# Patient Record
Sex: Female | Born: 1995 | Race: White | Hispanic: No | Marital: Single | Smoking: Never smoker
Health system: Southern US, Community
[De-identification: ages and names within clinical notes are randomized; demographics above are authoritative.]

## PROBLEM LIST (undated history)

## (undated) DIAGNOSIS — F419 Anxiety disorder, unspecified: Secondary | ICD-10-CM

## (undated) DIAGNOSIS — I10 Essential (primary) hypertension: Secondary | ICD-10-CM

## (undated) DIAGNOSIS — I4949 Other premature depolarization: Secondary | ICD-10-CM

## (undated) DIAGNOSIS — G35D Multiple sclerosis, unspecified: Secondary | ICD-10-CM

## (undated) DIAGNOSIS — G43909 Migraine, unspecified, not intractable, without status migrainosus: Secondary | ICD-10-CM

## (undated) DIAGNOSIS — F32A Depression, unspecified: Secondary | ICD-10-CM

## (undated) DIAGNOSIS — G35 Multiple sclerosis: Secondary | ICD-10-CM

## (undated) DIAGNOSIS — F329 Major depressive disorder, single episode, unspecified: Secondary | ICD-10-CM

## (undated) HISTORY — DX: Essential (primary) hypertension: I10

## (undated) HISTORY — DX: Other premature depolarization: I49.49

## (undated) HISTORY — PX: NO PAST SURGERIES: SHX2092

## (undated) HISTORY — DX: Multiple sclerosis: G35

## (undated) HISTORY — DX: Migraine, unspecified, not intractable, without status migrainosus: G43.909

## (undated) HISTORY — DX: Multiple sclerosis, unspecified: G35.D

## (undated) HISTORY — DX: Major depressive disorder, single episode, unspecified: F32.9

## (undated) HISTORY — DX: Anxiety disorder, unspecified: F41.9

## (undated) HISTORY — DX: Depression, unspecified: F32.A

---

## 2016-07-05 ENCOUNTER — Emergency Department
Admission: EM | Admit: 2016-07-05 | Discharge: 2016-07-05 | Disposition: A | Discharge status: Home / Self Care | Payer: BC Managed Care – PPO | Attending: Emergency Medicine | Admitting: Emergency Medicine

## 2016-07-05 ENCOUNTER — Encounter: Payer: Self-pay | Admitting: Emergency Medicine

## 2016-07-05 ENCOUNTER — Emergency Department: Payer: BC Managed Care – PPO

## 2016-07-05 DIAGNOSIS — Y939 Activity, unspecified: Secondary | ICD-10-CM | POA: Insufficient documentation

## 2016-07-05 DIAGNOSIS — Y999 Unspecified external cause status: Secondary | ICD-10-CM | POA: Insufficient documentation

## 2016-07-05 DIAGNOSIS — Y92410 Unspecified street and highway as the place of occurrence of the external cause: Secondary | ICD-10-CM | POA: Insufficient documentation

## 2016-07-05 DIAGNOSIS — S199XXA Unspecified injury of neck, initial encounter: Secondary | ICD-10-CM | POA: Diagnosis present

## 2016-07-05 DIAGNOSIS — M7918 Myalgia, other site: Secondary | ICD-10-CM

## 2016-07-05 DIAGNOSIS — S161XXA Strain of muscle, fascia and tendon at neck level, initial encounter: Secondary | ICD-10-CM | POA: Insufficient documentation

## 2016-07-05 HISTORY — DX: Other premature depolarization: I49.49

## 2016-07-05 LAB — POCT PREGNANCY, URINE: PREG TEST UR: NEGATIVE

## 2016-07-05 MED ORDER — CYCLOBENZAPRINE HCL 5 MG PO TABS: 5.0000 mg | ORAL_TABLET | Freq: Three times a day (TID) | ORAL | 0 refills | Status: DC | PRN

## 2016-07-05 MED ORDER — KETOROLAC TROMETHAMINE 30 MG/ML IJ SOLN
INTRAMUSCULAR | Status: AC
  Filled 2016-07-05: qty 1

## 2016-07-05 MED ORDER — DICLOFENAC SODIUM 25 MG PO TBEC
50.0000 mg | DELAYED_RELEASE_TABLET | Freq: Once | ORAL | Status: AC
  Administered 2016-07-05: 50 mg via ORAL
  Filled 2016-07-05: qty 1

## 2016-07-05 MED ORDER — NABUMETONE 750 MG PO TABS
750.0000 mg | ORAL_TABLET | Freq: Two times a day (BID) | ORAL | 0 refills | Status: DC
Start: 2016-07-05 — End: 2018-02-01

## 2016-07-05 NOTE — Discharge Instructions (Signed)
Your exam and x-rays are essentially normal at this time. Wear the soft neck collar as needed for support. Take the prescription meds as directed. Apply ice or moist heat compresses for comfort. Follow-up with [facility] or return to the ED as needed.
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
## 2018-02-01 ENCOUNTER — Encounter: Payer: Self-pay | Admitting: Physician Assistant

## 2018-02-01 ENCOUNTER — Other Ambulatory Visit (HOSPITAL_COMMUNITY)
Admission: RE | Admit: 2018-02-01 | Discharge: 2018-02-01 | Disposition: A | Discharge status: Home / Self Care | Payer: BC Managed Care – PPO | Source: Ambulatory Visit | Attending: Physician Assistant | Admitting: Physician Assistant

## 2018-02-01 ENCOUNTER — Ambulatory Visit: Payer: BC Managed Care – PPO | Admitting: Physician Assistant

## 2018-02-01 VITALS — BP 122/84 | HR 90 | Temp 98.7°F | Wt 161.6 lb

## 2018-02-01 DIAGNOSIS — Z0000 Encounter for general adult medical examination without abnormal findings: Secondary | ICD-10-CM | POA: Diagnosis not present

## 2018-02-01 DIAGNOSIS — F329 Major depressive disorder, single episode, unspecified: Secondary | ICD-10-CM | POA: Diagnosis not present

## 2018-02-01 DIAGNOSIS — Z131 Encounter for screening for diabetes mellitus: Secondary | ICD-10-CM

## 2018-02-01 DIAGNOSIS — Z114 Encounter for screening for human immunodeficiency virus [HIV]: Secondary | ICD-10-CM

## 2018-02-01 DIAGNOSIS — Z13220 Encounter for screening for lipoid disorders: Secondary | ICD-10-CM

## 2018-02-01 DIAGNOSIS — Z124 Encounter for screening for malignant neoplasm of cervix: Secondary | ICD-10-CM

## 2018-02-01 DIAGNOSIS — Z130 Encounter for screening for diseases of the blood and blood-forming organs and certain disorders involving the immune mechanism: Secondary | ICD-10-CM

## 2018-02-01 DIAGNOSIS — Z30011 Encounter for initial prescription of contraceptive pills: Secondary | ICD-10-CM

## 2018-02-01 DIAGNOSIS — F419 Anxiety disorder, unspecified: Secondary | ICD-10-CM

## 2018-02-01 DIAGNOSIS — F32A Depression, unspecified: Secondary | ICD-10-CM

## 2018-02-01 DIAGNOSIS — Z1329 Encounter for screening for other suspected endocrine disorder: Secondary | ICD-10-CM

## 2018-02-01 DIAGNOSIS — I4949 Other premature depolarization: Secondary | ICD-10-CM | POA: Diagnosis not present

## 2018-02-01 MED ORDER — ESCITALOPRAM OXALATE 10 MG PO TABS: 10.0000 mg | ORAL_TABLET | Freq: Every day | ORAL | 0 refills | Status: DC

## 2018-02-01 MED ORDER — NORETHINDRONE ACET-ETHINYL EST 1-20 MG-MCG PO TABS: 1.0000 | ORAL_TABLET | Freq: Every day | ORAL | 4 refills | Status: DC

## 2018-02-01 NOTE — Progress Notes (Signed)
Patient: Janet Duran, Female    DOB: Nov 06, 1995, 22 y.o.   MRN: [id number] Visit Date: 02/02/2018  Today's Provider: Trinna Post, PA-C   Chief Complaint  Patient presents with  . New Patient (Initial Visit)   Subjective:    [place] is a 22 y.o. female who presents today for new patient appointment. She feels fairly well ( patient states she has anxiety). She reports exercising includes walking and runnung. She reports she is sleeping poorly due to insomina.  She currently attends [employer] having transferred from [employer]. She is currently studying social work. She is from [place]. She is adopted. She is living in [place] with her boyfriend. She is sexually active with a single female partner. She does not have children. Her periods are regular.   She wishes to resume her birth control, which was Loestrin. She has a history of migraines without aura that got better on birth control and have resumed since she has been off birth control.   She also has issues with anxiety and depression and has for some time, though has never been on medication for this. Reports previous counseling session while in the foster care system and does not care to do this right now. She does desire to start a medication.  -----------------------------------------------------------------   Review of Systems  Constitutional: Positive for fatigue.  HENT: Negative.   Eyes: Negative.   Respiratory: Negative.   Cardiovascular: Negative.   Gastrointestinal: Negative.   Endocrine: Negative.   Genitourinary: Negative.   Musculoskeletal: Negative.   Skin: Negative.   Allergic/Immunologic: Negative.   Neurological: Negative.   Hematological: Negative.   Psychiatric/Behavioral: Positive for sleep disturbance.    Social History She  reports that she has never smoked. She has never used smokeless tobacco. She reports current alcohol use. She reports that she does  not use drugs. Social History   Socioeconomic History  . Marital status: Single    Spouse name: Not on file  . Number of children: Not on file  . Years of education: Not on file  . Highest education level: Not on file  Occupational History  . Not on file  Social Needs  . Financial resource strain: Not on file  . Food insecurity:    Worry: Not on file    Inability: Not on file  . Transportation needs:    Medical: Not on file    Non-medical: Not on file  Tobacco Use  . Smoking status: Never Smoker  . Smokeless tobacco: Never Used  Substance and Sexual Activity  . Alcohol use: Yes  . Drug use: No  . Sexual activity: Not on file  Lifestyle  . Physical activity:    Days per week: Not on file    Minutes per session: Not on file  . Stress: Not on file  Relationships  . Social connections:    Talks on phone: Not on file    Gets together: Not on file    Attends religious service: Not on file    Active member of club or organization: Not on file    Attends meetings of clubs or organizations: Not on file    Relationship status: Not on file  Other Topics Concern  . Not on file  Social History Narrative  . Not on file    Patient Active Problem List   Diagnosis Date Noted  . Ectopic heartbeat     History reviewed. No pertinent surgical history.  Family History  No family status information on file.   Her family history is not on file.     No Known Allergies  Previous Medications   CYCLOBENZAPRINE (FLEXERIL) 5 MG TABLET    Take 1 tablet (5 mg total) by mouth 3 (three) times daily as needed for muscle spasms.    Patient Care Team: Paulene Floor as PCP - General (Physician Assistant)      Objective:   Vitals: BP 122/84 (BP Location: Left Arm, Patient Position: Sitting, Cuff Size: Large)   Pulse 90   Temp 98.7 F (37.1 C) (Oral)   Wt 161 lb 9.6 oz (73.3 kg)   LMP 01/18/2018   SpO2 99%   BMI 29.56 kg/m    Physical Exam Constitutional:       Appearance: Normal appearance.  HENT:     Head: Normocephalic and atraumatic.     Right Ear: Tympanic membrane and ear canal normal.     Left Ear: Tympanic membrane and ear canal normal.     Nose: Nose normal.     Mouth/Throat:     Mouth: Mucous membranes are moist.     Pharynx: Oropharynx is clear.  Neck:     Musculoskeletal: Neck supple.  Cardiovascular:     Rate and Rhythm: Normal rate and regular rhythm.     Heart sounds: Normal heart sounds.  Pulmonary:     Effort: Pulmonary effort is normal.     Breath sounds: Normal breath sounds.  Chest:     Breasts: Breasts are symmetrical.        Right: Normal. No swelling, bleeding, inverted nipple, mass, nipple discharge, skin change or tenderness.        Left: Normal. No swelling, bleeding, inverted nipple, mass, nipple discharge, skin change or tenderness.  Abdominal:     General: Abdomen is flat. Bowel sounds are normal.     Palpations: Abdomen is soft.     Tenderness: There is no abdominal tenderness.  Genitourinary:    Exam position: Lithotomy position.     Labia:        Right: No rash, tenderness, lesion or injury.        Left: No rash, tenderness, lesion or injury.      Cervix: No cervical motion tenderness, discharge, friability, lesion, erythema, cervical bleeding or eversion.     Uterus: Normal.      Adnexa: Right adnexa normal and left adnexa normal.       Right: No mass.         Left: No mass.    Skin:    General: Skin is warm and dry.  Neurological:     Mental Status: She is alert and oriented to person, place, and time. Mental status is at baseline.  Psychiatric:        Mood and Affect: Mood normal.        Behavior: Behavior normal.      Depression Screen PHQ 2/9 Scores 02/01/2018  PHQ - 2 Score 2  PHQ- 9 Score 11      Assessment & Plan:     Routine Health Maintenance and Physical Exam  Exercise Activities and Dietary recommendations Goals   None      There is no immunization history on file  for this patient.  Health Maintenance  Topic Date Due  . HIV Screening  07/25/2010  . TETANUS/TDAP  07/25/2014  . PAP-Cervical Cytology Screening  07/24/2016  . PAP SMEAR-Modifier  07/24/2016  . INFLUENZA VACCINE  03/08/2018 (Originally 09/21/2017)     Discussed health benefits of physical activity, and encouraged her to engage in regular exercise appropriate for her age and condition.    1. Annual physical exam   2. Ectopic heartbeat   3. Encounter for initial prescription of contraceptive pills  - norethindrone-ethinyl estradiol (LOESTRIN 1/20, 21,) 1-20 MG-MCG tablet; Take 1 tablet by mouth daily.  Dispense: 3 Package; Refill: 4  4. Cervical cancer screening  - Cytology - PAP  5. Diabetes mellitus screening  - Comprehensive Metabolic Panel (CMET)  6. Screening for deficiency anemia  - CBC With Differential  7. Thyroid disorder screen  - TSH  8. Screening cholesterol level  - Lipid Profile  9. Encounter for screening for HIV  - HIV antibody (with reflex)  10. Anxiety and depression  - escitalopram (LEXAPRO) 10 MG tablet; Take 1 tablet (10 mg total) by mouth daily.  Dispense: 90 tablet; Refill: 0  Return in about 1 month (around 03/04/2018) for anxiety and depression .  The entirety of the information documented in the History of Present Illness, Review of Systems and Physical Exam were personally obtained by me. Portions of this information were initially documented by Hurman Horn, CMA and reviewed by me for thoroughness and accuracy.      --------------------------------------------------------------------

## 2018-02-01 NOTE — Patient Instructions (Signed)

## 2018-02-02 ENCOUNTER — Telehealth: Payer: Self-pay

## 2018-02-02 ENCOUNTER — Encounter: Payer: Self-pay | Admitting: Physician Assistant

## 2018-02-02 DIAGNOSIS — R110 Nausea: Secondary | ICD-10-CM

## 2018-02-02 LAB — CBC WITH DIFFERENTIAL
Basophils Absolute: 0.1 10*3/uL (ref 0.0–0.2)
Basos: 1 %
EOS (ABSOLUTE): 0.4 10*3/uL (ref 0.0–0.4)
Eos: 4 %
Hematocrit: 40.4 % (ref 34.0–46.6)
Hemoglobin: 13.8 g/dL (ref 11.1–15.9)
Immature Grans (Abs): 0 10*3/uL (ref 0.0–0.1)
Immature Granulocytes: 0 %
Lymphocytes Absolute: 3.3 10*3/uL — ABNORMAL HIGH (ref 0.7–3.1)
Lymphs: 30 %
MCH: 30 pg (ref 26.6–33.0)
MCHC: 34.2 g/dL (ref 31.5–35.7)
MCV: 88 fL (ref 79–97)
Monocytes Absolute: 0.9 10*3/uL (ref 0.1–0.9)
Monocytes: 8 %
Neutrophils Absolute: 6.3 10*3/uL (ref 1.4–7.0)
Neutrophils: 57 %
RBC: 4.6 x10E6/uL (ref 3.77–5.28)
RDW: 11.8 % — ABNORMAL LOW (ref 12.3–15.4)
WBC: 11 10*3/uL — ABNORMAL HIGH (ref 3.4–10.8)

## 2018-02-02 LAB — COMPREHENSIVE METABOLIC PANEL
ALT: 21 IU/L (ref 0–32)
AST: 18 IU/L (ref 0–40)
Albumin/Globulin Ratio: 2 (ref 1.2–2.2)
Albumin: 4.5 g/dL (ref 3.5–5.5)
Alkaline Phosphatase: 54 IU/L (ref 39–117)
BUN/Creatinine Ratio: 16 (ref 9–23)
BUN: 9 mg/dL (ref 6–20)
Bilirubin Total: 0.4 mg/dL (ref 0.0–1.2)
CO2: 20 mmol/L (ref 20–29)
Calcium: 9.5 mg/dL (ref 8.7–10.2)
Chloride: 102 mmol/L (ref 96–106)
Creatinine, Ser: 0.58 mg/dL (ref 0.57–1.00)
GFR calc Af Amer: 151 mL/min/{1.73_m2} (ref >59)
GFR calc non Af Amer: 131 mL/min/{1.73_m2} (ref >59)
Globulin, Total: 2.3 g/dL (ref 1.5–4.5)
Glucose: 81 mg/dL (ref 65–99)
Potassium: 4.5 mmol/L (ref 3.5–5.2)
Sodium: 138 mmol/L (ref 134–144)
Total Protein: 6.8 g/dL (ref 6.0–8.5)

## 2018-02-02 LAB — LIPID PANEL
Chol/HDL Ratio: 4.5 ratio — ABNORMAL HIGH (ref 0.0–4.4)
Cholesterol, Total: 227 mg/dL — ABNORMAL HIGH (ref 100–199)
HDL: 50 mg/dL (ref >39)
LDL Calculated: 136 mg/dL — ABNORMAL HIGH (ref 0–99)
Triglycerides: 204 mg/dL — ABNORMAL HIGH (ref 0–149)
VLDL Cholesterol Cal: 41 mg/dL — ABNORMAL HIGH (ref 5–40)

## 2018-02-02 LAB — TSH: TSH: 1.88 u[IU]/mL (ref 0.450–4.500)

## 2018-02-02 LAB — HIV ANTIBODY (ROUTINE TESTING W REFLEX): HIV Screen 4th Generation wRfx: NONREACTIVE

## 2018-02-02 MED ORDER — ONDANSETRON HCL 4 MG PO TABS: 4.0000 mg | ORAL_TABLET | Freq: Three times a day (TID) | ORAL | 0 refills | Status: DC | PRN

## 2018-02-02 NOTE — Telephone Encounter (Signed)
-----   Message from Trinna Post, [place] sent at 02/02/2018  9:02 AM EST ----- CMET normal, no diabetes, normal kidney and liver function. Slightly white count on CBC, likely small viral infection. No anemia. TSH normal. Cholesterol slightly high, recommend increasing exercise and avoiding saturated foods.

## 2018-02-02 NOTE — Telephone Encounter (Signed)
Pt advised.   Thanks,   -Romario Tith  

## 2018-02-05 ENCOUNTER — Telehealth: Payer: Self-pay

## 2018-02-05 LAB — CYTOLOGY - PAP
Chlamydia: NEGATIVE
Diagnosis: NEGATIVE
Neisseria Gonorrhea: NEGATIVE

## 2018-02-05 NOTE — Telephone Encounter (Signed)
-----   Message from Trinna Post, [place] sent at 02/05/2018  1:58 PM EST ----- PAP normal, STI testing negative. Repeat 3 years.

## 2018-02-05 NOTE — Telephone Encounter (Signed)
Patient was advised.  

## 2018-02-22 ENCOUNTER — Encounter: Payer: Self-pay | Admitting: Physician Assistant

## 2018-03-14 ENCOUNTER — Ambulatory Visit: Payer: BC Managed Care – PPO | Admitting: Physician Assistant

## 2018-03-14 ENCOUNTER — Encounter: Payer: Self-pay | Admitting: Physician Assistant

## 2018-03-14 VITALS — BP 138/83 | HR 93 | Temp 98.6°F | Resp 16 | Wt 158.0 lb

## 2018-03-14 DIAGNOSIS — F419 Anxiety disorder, unspecified: Secondary | ICD-10-CM | POA: Diagnosis not present

## 2018-03-14 MED ORDER — SERTRALINE HCL 50 MG PO TABS: 50.0000 mg | ORAL_TABLET | Freq: Every day | ORAL | 0 refills | Status: DC

## 2018-03-14 NOTE — Progress Notes (Signed)
Patient: Janet Duran Female    DOB: [birth year]   23 y.o.   MRN: [id number] Visit Date: 03/22/2018  Today's Provider: Trinna Post, PA-C   Chief Complaint  Patient presents with  . Follow-up   Subjective:     HPI  Depression/Anxiety, Follow-up  She  was last seen for this 6 weeks ago. Changes made at last visit include start Lexapro 10 mg daily.   She reports excellent compliance with treatment. She is not having side effects.   She reports excellent tolerance of treatment. Current symptoms include: depressed mood, difficulty concentrating, fatigue and insomnia She feels she is Unchanged since last visit. Reports she does not feel any better.  Reports she did see eye doctor for blurred vision only when she looks to the right side and she needs glasses.   ------------------------------------------------------------------------    No Known Allergies   Current Outpatient Medications:  .  escitalopram (LEXAPRO) 10 MG tablet, Take 1 tablet (10 mg total) by mouth daily., Disp: 90 tablet, Rfl: 0 .  norethindrone-ethinyl estradiol (LOESTRIN 1/20, 21,) 1-20 MG-MCG tablet, Take 1 tablet by mouth daily., Disp: 3 Package, Rfl: 4 .  ondansetron (ZOFRAN) 4 MG tablet, Take 1 tablet (4 mg total) by mouth every 8 (eight) hours as needed for nausea or vomiting., Disp: 20 tablet, Rfl: 0 .  sertraline (ZOLOFT) 50 MG tablet, Take 1 tablet (50 mg total) by mouth daily., Disp: 90 tablet, Rfl: 0  Review of Systems  Constitutional: Negative.   HENT: Positive for rhinorrhea.   Respiratory: Positive for cough. Negative for shortness of breath and wheezing.   Psychiatric/Behavioral: Positive for decreased concentration and sleep disturbance. The patient is nervous/anxious.     Social History   Tobacco Use  . Smoking status: Never Smoker  . Smokeless tobacco: Never Used  Substance Use Topics  . Alcohol use: Yes      Objective:   BP 138/83 (BP Location: Left Arm,  Patient Position: Sitting, Cuff Size: Normal)   Pulse 93   Temp 98.6 F (37 C) (Oral)   Resp 16   Wt 158 lb (71.7 kg)   BMI 28.90 kg/m  Vitals:   03/14/18 0826  BP: 138/83  Pulse: 93  Resp: 16  Temp: 98.6 F (37 C)  TempSrc: Oral  Weight: 158 lb (71.7 kg)     Physical Exam Constitutional:      Appearance: Normal appearance.  Cardiovascular:     Rate and Rhythm: Normal rate and regular rhythm.     Heart sounds: Normal heart sounds.  Pulmonary:     Effort: Pulmonary effort is normal.     Breath sounds: Normal breath sounds.  Skin:    General: Skin is warm and dry.  Neurological:     Mental Status: She is alert.  Psychiatric:        Mood and Affect: Mood normal.        Behavior: Behavior normal.         Assessment & Plan    1. Anxiety  Will change Lexapro to zoloft as below. Continue to encourage counseling.   - sertraline (ZOLOFT) 50 MG tablet; Take 1 tablet (50 mg total) by mouth daily.  Dispense: 90 tablet; Refill: 0  Return in about 6 weeks (around 04/25/2018) for anxiety .  The entirety of the information documented in the History of Present Illness, Review of Systems and Physical Exam were personally obtained by me. Portions of this information were initially  documented by Lynford Humphrey, CMA and reviewed by me for thoroughness and accuracy.       Trinna Post, PA-C  [facility]

## 2018-03-14 NOTE — Patient Instructions (Addendum)
Allergy medications: Claritin, zyrtec, allegra, xyzal   Generalized Anxiety Disorder, Adult Generalized anxiety disorder (GAD) is a mental health disorder. People with this condition constantly worry about everyday events. Unlike normal anxiety, worry related to GAD is not triggered by a specific event. These worries also do not fade or get better with time. GAD interferes with life functions, including relationships, work, and school. GAD can vary from mild to severe. People with severe GAD can have intense waves of anxiety with physical symptoms (panic attacks). What are the causes? The exact cause of GAD is not known. What increases the risk? This condition is more likely to develop in:  Women.  People who have a family history of anxiety disorders.  People who are very shy.  People who experience very stressful life events, such as the death of a loved one.  People who have a very stressful family environment. What are the signs or symptoms? People with GAD often worry excessively about many things in their lives, such as their health and family. They may also be overly concerned about:  Doing well at work.  Being on time.  Natural disasters.  Friendships. Physical symptoms of GAD include:  Fatigue.  Muscle tension or having muscle twitches.  Trembling or feeling shaky.  Being easily startled.  Feeling like your heart is pounding or racing.  Feeling out of breath or like you cannot take a deep breath.  Having trouble falling asleep or staying asleep.  Sweating.  Nausea, diarrhea, or irritable bowel syndrome (IBS).  Headaches.  Trouble concentrating or remembering facts.  Restlessness.  Irritability. How is this diagnosed? Your health care provider can diagnose GAD based on your symptoms and medical history. You will also have a physical exam. The health care provider will ask specific questions about your symptoms, including how severe they are, when they  started, and if they come and go. Your health care provider may ask you about your use of alcohol or drugs, including prescription medicines. Your health care provider may refer you to a mental health specialist for further evaluation. Your health care provider will do a thorough examination and may perform additional tests to rule out other possible causes of your symptoms. To be diagnosed with GAD, a person must have anxiety that:  Is out of his or her control.  Affects several different aspects of his or her life, such as work and relationships.  Causes distress that makes him or her unable to take part in normal activities.  Includes at least three physical symptoms of GAD, such as restlessness, fatigue, trouble concentrating, irritability, muscle tension, or sleep problems. Before your health care provider can confirm a diagnosis of GAD, these symptoms must be present more days than they are not, and they must last for six months or longer. How is this treated? The following therapies are usually used to treat GAD:  Medicine. Antidepressant medicine is usually prescribed for long-term daily control. Antianxiety medicines may be added in severe cases, especially when panic attacks occur.  Talk therapy (psychotherapy). Certain types of talk therapy can be helpful in treating GAD by providing support, education, and guidance. Options include: ? Cognitive behavioral therapy (CBT). People learn coping skills and techniques to ease their anxiety. They learn to identify unrealistic or negative thoughts and behaviors and to replace them with positive ones. ? Acceptance and commitment therapy (ACT). This treatment teaches people how to be mindful as a way to cope with unwanted thoughts and feelings. ? Biofeedback. This  process trains you to manage your body's response (physiological response) through breathing techniques and relaxation methods. You will work with a therapist while machines are used  to monitor your physical symptoms.  Stress management techniques. These include yoga, meditation, and exercise. A mental health specialist can help determine which treatment is best for you. Some people see improvement with one type of therapy. However, other people require a combination of therapies. Follow these instructions at home:  Take over-the-counter and prescription medicines only as told by your health care provider.  Try to maintain a normal routine.  Try to anticipate stressful situations and allow extra time to manage them.  Practice any stress management or self-calming techniques as taught by your health care provider.  Do not punish yourself for setbacks or for not making progress.  Try to recognize your accomplishments, even if they are small.  Keep all follow-up visits as told by your health care provider. This is important. Contact a health care provider if:  Your symptoms do not get better.  Your symptoms get worse.  You have signs of depression, such as: ? A persistently sad, cranky, or irritable mood. ? Loss of enjoyment in activities that used to bring you joy. ? Change in weight or eating. ? Changes in sleeping habits. ? Avoiding friends or family members. ? Loss of energy for normal tasks. ? Feelings of guilt or worthlessness. Get help right away if:  You have serious thoughts about hurting yourself or others. If you ever feel like you may hurt yourself or others, or have thoughts about taking your own life, get help right away. You can go to your nearest emergency department or call:  Your local emergency services (911 in the [place]  A suicide crisis helpline, such as the [facility] at 450-009-3693. This is open 24 hours a day. Summary  Generalized anxiety disorder (GAD) is a mental health disorder that involves worry that is not triggered by a specific event.  People with GAD often worry excessively about many things  in their lives, such as their health and family.  GAD may cause physical symptoms such as restlessness, trouble concentrating, sleep problems, frequent sweating, nausea, diarrhea, headaches, and trembling or muscle twitching.  A mental health specialist can help determine which treatment is best for you. Some people see improvement with one type of therapy. However, other people require a combination of therapies. This information is not intended to replace advice given to you by your health care provider. Make sure you discuss any questions you have with your health care provider. Document Released: 06/04/2012 Document Revised: 12/29/2015 Document Reviewed: 12/29/2015 Elsevier Interactive Patient Education  2019 [employer].

## 2018-04-24 ENCOUNTER — Other Ambulatory Visit: Payer: Self-pay | Admitting: Physician Assistant

## 2018-04-24 DIAGNOSIS — F329 Major depressive disorder, single episode, unspecified: Secondary | ICD-10-CM

## 2018-04-24 DIAGNOSIS — F32A Depression, unspecified: Secondary | ICD-10-CM

## 2018-04-24 DIAGNOSIS — F419 Anxiety disorder, unspecified: Principal | ICD-10-CM

## 2018-04-25 ENCOUNTER — Ambulatory Visit: Payer: BC Managed Care – PPO | Admitting: Physician Assistant

## 2018-04-25 ENCOUNTER — Encounter: Payer: Self-pay | Admitting: Physician Assistant

## 2018-04-25 VITALS — BP 125/81 | HR 99 | Temp 98.1°F | Resp 16 | Wt 161.2 lb

## 2018-04-25 DIAGNOSIS — F419 Anxiety disorder, unspecified: Secondary | ICD-10-CM

## 2018-04-25 DIAGNOSIS — L989 Disorder of the skin and subcutaneous tissue, unspecified: Secondary | ICD-10-CM | POA: Diagnosis not present

## 2018-04-25 DIAGNOSIS — F32A Depression, unspecified: Secondary | ICD-10-CM

## 2018-04-25 DIAGNOSIS — M545 Low back pain, unspecified: Secondary | ICD-10-CM

## 2018-04-25 DIAGNOSIS — G8929 Other chronic pain: Secondary | ICD-10-CM

## 2018-04-25 DIAGNOSIS — F329 Major depressive disorder, single episode, unspecified: Secondary | ICD-10-CM

## 2018-04-25 MED ORDER — BUSPIRONE HCL 15 MG PO TABS: ORAL_TABLET | ORAL | 0 refills | Status: DC

## 2018-04-25 MED ORDER — HYDROXYZINE HCL 10 MG PO TABS: 10.0000 mg | ORAL_TABLET | Freq: Every day | ORAL | 0 refills | Status: DC | PRN

## 2018-04-25 NOTE — Patient Instructions (Signed)
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
## 2018-04-28 ENCOUNTER — Encounter: Payer: Self-pay | Admitting: Physician Assistant

## 2018-05-08 ENCOUNTER — Encounter: Payer: Self-pay | Admitting: Physician Assistant

## 2018-05-08 ENCOUNTER — Ambulatory Visit: Payer: BC Managed Care – PPO | Admitting: Physician Assistant

## 2018-05-08 ENCOUNTER — Other Ambulatory Visit: Payer: Self-pay

## 2018-05-08 VITALS — BP 129/91 | HR 125 | Temp 98.5°F | Resp 16 | Wt 163.2 lb

## 2018-05-08 DIAGNOSIS — J029 Acute pharyngitis, unspecified: Secondary | ICD-10-CM | POA: Diagnosis not present

## 2018-05-08 LAB — POCT RAPID STREP A (OFFICE): Rapid Strep A Screen: NEGATIVE

## 2018-05-08 MED ORDER — AMOXICILLIN 875 MG PO TABS: 875.0000 mg | ORAL_TABLET | Freq: Two times a day (BID) | ORAL | 0 refills | Status: AC

## 2018-05-08 NOTE — Progress Notes (Signed)
Patient: Janet Duran Female    DOB: April 12, 1995   23 y.o.   MRN: [id number] Visit Date: 05/08/2018  Today's Provider: Trinna Post, PA-C   Chief Complaint  Patient presents with  . URI   Subjective:     URI   This is a new problem. The problem has been gradually worsening. There has been no fever. Associated symptoms include congestion, coughing, rhinorrhea and a sore throat ("started last weekend, got better but this past weekend it got "really bad".). Pertinent negatives include no wheezing. She has tried NSAIDs (DayNyquil and IBU) for the symptoms. The treatment provided no relief.     No Known Allergies   Current Outpatient Medications:  .  busPIRone (BUSPAR) 15 MG tablet, 7.5 mg 2x daily x 1 wk. Then 15 mg in AM and 7.5 mg in PM x 1 wk. Then 15 mg 2x daily onward., Disp: 90 tablet, Rfl: 0 .  hydrOXYzine (ATARAX/VISTARIL) 10 MG tablet, Take 1 tablet (10 mg total) by mouth daily as needed., Disp: 30 tablet, Rfl: 0 .  norethindrone-ethinyl estradiol (LOESTRIN 1/20, 21,) 1-20 MG-MCG tablet, Take 1 tablet by mouth daily., Disp: 3 Package, Rfl: 4 .  ondansetron (ZOFRAN) 4 MG tablet, Take 1 tablet (4 mg total) by mouth every 8 (eight) hours as needed for nausea or vomiting., Disp: 20 tablet, Rfl: 0 .  escitalopram (LEXAPRO) 10 MG tablet, Take 1 tablet (10 mg total) by mouth daily. (Patient not taking: Reported on 04/25/2018), Disp: 90 tablet, Rfl: 0 .  sertraline (ZOLOFT) 50 MG tablet, Take 1 tablet (50 mg total) by mouth daily. (Patient not taking: Reported on 05/08/2018), Disp: 90 tablet, Rfl: 0  Review of Systems  Constitutional: Positive for fatigue. Negative for fever.  HENT: Positive for congestion, rhinorrhea and sore throat ("started last weekend, got better but this past weekend it got "really bad".).   Respiratory: Positive for cough, chest tightness and shortness of breath (She usually has this w/ectopic heart beat but now is constant). Negative for wheezing.    Neurological: Positive for weakness.    Social History   Tobacco Use  . Smoking status: Never Smoker  . Smokeless tobacco: Never Used  Substance Use Topics  . Alcohol use: Yes      Objective:   BP (!) 129/91 (BP Location: Left Arm, Patient Position: Sitting, Cuff Size: Large)   Pulse (!) 125   Temp 98.5 F (36.9 C) (Oral)   Resp 16   Wt 163 lb 3.2 oz (74 kg)   SpO2 99%   BMI 29.85 kg/m  Vitals:   05/08/18 0842  BP: (!) 129/91  Pulse: (!) 125  Resp: 16  Temp: 98.5 F (36.9 C)  TempSrc: Oral  SpO2: 99%  Weight: 163 lb 3.2 oz (74 kg)     Physical Exam Constitutional:      Appearance: Normal appearance. She is not ill-appearing.  HENT:     Right Ear: Tympanic membrane and ear canal normal.     Left Ear: Tympanic membrane and ear canal normal.     Mouth/Throat:     Pharynx: Posterior oropharyngeal erythema present. No oropharyngeal exudate.  Cardiovascular:     Rate and Rhythm: Normal rate and regular rhythm.     Heart sounds: Normal heart sounds.  Pulmonary:     Effort: Pulmonary effort is normal.     Breath sounds: Normal breath sounds.  Lymphadenopathy:     Cervical: No cervical adenopathy.  Skin:  General: Skin is warm and dry.  Neurological:     Mental Status: She is alert and oriented to person, place, and time. Mental status is at baseline.  Psychiatric:        Mood and Affect: Mood normal.        Behavior: Behavior normal.         Assessment & Plan    1. Sore throat  Rapid strep negative, patient complains of persistent sore throat. Will treat with small course amoxicillin to cover for bacterial causes. Flu negative. Patient called back reporting persistent cough and sinus congestion. Advised on symptomatic treatment and prednisone 20 mg QD x 5 days sent in.   - POCT rapid strep A - amoxicillin (AMOXIL) 875 MG tablet; Take 1 tablet (875 mg total) by mouth 2 (two) times daily for 5 days.  Dispense: 10 tablet; Refill: 0 - Influenza A and B,  RT PCR  I have spent 25 minutes with this patient, >50% of which was spent on counseling and coordination of care.     Trinna Post, PA-C  [facility]

## 2018-05-09 ENCOUNTER — Ambulatory Visit: Payer: BC Managed Care – PPO

## 2018-05-10 ENCOUNTER — Encounter: Payer: Self-pay | Admitting: Physician Assistant

## 2018-05-10 ENCOUNTER — Telehealth: Payer: Self-pay | Admitting: Physician Assistant

## 2018-05-10 DIAGNOSIS — J019 Acute sinusitis, unspecified: Secondary | ICD-10-CM

## 2018-05-10 DIAGNOSIS — J029 Acute pharyngitis, unspecified: Secondary | ICD-10-CM

## 2018-05-10 LAB — INFLUENZA A AND B, RT PCR
Influenza A PCR: NEGATIVE
Influenza B PCR: NEGATIVE

## 2018-05-10 NOTE — Telephone Encounter (Signed)
Looking for test results from her Flu on Tuesday  CB#  [phone]  Thanks teri

## 2018-05-10 NOTE — Telephone Encounter (Signed)
Negative.

## 2018-05-10 NOTE — Telephone Encounter (Signed)
Can you sign her labs please

## 2018-05-10 NOTE — Telephone Encounter (Signed)
Patient advised as directed below. Patient reports that she send a message to you and reports that she is not any better.

## 2018-05-11 ENCOUNTER — Encounter: Payer: Self-pay | Admitting: Physician Assistant

## 2018-05-11 MED ORDER — PREDNISONE 20 MG PO TABS: 20.0000 mg | ORAL_TABLET | Freq: Every day | ORAL | 0 refills | Status: AC

## 2018-05-11 NOTE — Telephone Encounter (Signed)
No answer/call air drop

## 2018-05-11 NOTE — Telephone Encounter (Signed)
It could be mononucleosis which is another virus common among young people. Her lungs sounded well on exam and she did not have a fever. I can send her in a small course of prednisone for sore throat to help her. If it is something like mononucleosis it will have to run it's course. If she has a fever or trouble breathing she will need to be seen again.

## 2018-05-11 NOTE — Telephone Encounter (Signed)
Patient advised as directed below. Patient wants the prednisone to be send in to [facility] in [place]

## 2018-05-11 NOTE — Telephone Encounter (Signed)
Sent!

## 2018-05-14 NOTE — Patient Instructions (Signed)
Sore Throat  When you have a sore throat, your throat may feel:  · Tender.  · Burning.  · Irritated.  · Scratchy.  · Painful when you swallow.  · Painful when you talk.  Many things can cause a sore throat, such as:  · An infection.  · Allergies.  · Dry air.  · Smoke or pollution.  · Radiation treatment.  · Gastroesophageal reflux disease (GERD).  · A tumor.  A sore throat can be the first sign of another sickness. It can happen with other problems, like:  · Coughing.  · Sneezing.  · Fever.  · Swelling in the neck.  Most sore throats go away without treatment.  Follow these instructions at home:         · Take over-the-counter medicines only as told by your doctor.  ? If your child has a sore throat, do not give your child aspirin.  · Drink enough fluids to keep your pee (urine) pale yellow.  · Rest when you feel you need to.  · To help with pain:  ? Sip warm liquids, such as broth, herbal tea, or warm water.  ? Eat or drink cold or frozen liquids, such as frozen ice pops.  ? Gargle with a salt-water mixture 3-4 times a day or as needed. To make a salt-water mixture, add ½-1 tsp (3-6 g) of salt to 1 cup (237 mL) of warm water. Mix it until you cannot see the salt anymore.  ? Suck on hard candy or throat lozenges.  ? Put a cool-mist humidifier in your bedroom at night.  ? Sit in the bathroom with the door closed for 5-10 minutes while you run hot water in the shower.  · Do not use any products that contain nicotine or tobacco, such as cigarettes, e-cigarettes, and chewing tobacco. If you need help quitting, ask your doctor.  · Wash your hands well and often with soap and water. If soap and water are not available, use hand sanitizer.  Contact a doctor if:  · You have a fever for more than 2-3 days.  · You keep having symptoms for more than 2-3 days.  · Your throat does not get better in 7 days.  · You have a fever and your symptoms suddenly get worse.  · Your child who is 3 months to 3 years old has a temperature of  102.2°F (39°C) or higher.  Get help right away if:  · You have trouble breathing.  · You cannot swallow fluids, soft foods, or your saliva.  · You have swelling in your throat or neck that gets worse.  · You keep feeling sick to your stomach (nauseous).  · You keep throwing up (vomiting).  Summary  · A sore throat is pain, burning, irritation, or scratchiness in the throat. Many things can cause a sore throat.  · Take over-the-counter medicines only as told by your doctor. Do not give your child aspirin.  · Drink plenty of fluids, and rest as needed.  · Contact a doctor if your symptoms get worse or your sore throat does not get better within 7 days.  This information is not intended to replace advice given to you by your health care provider. Make sure you discuss any questions you have with your health care provider.  Document Released: 11/17/2007 Document Revised: 07/10/2017 Document Reviewed: 07/10/2017  Elsevier Interactive Patient Education © 2019 [employer]

## 2018-05-20 ENCOUNTER — Other Ambulatory Visit: Payer: Self-pay | Admitting: Physician Assistant

## 2018-05-20 DIAGNOSIS — F329 Major depressive disorder, single episode, unspecified: Secondary | ICD-10-CM

## 2018-05-20 DIAGNOSIS — F419 Anxiety disorder, unspecified: Principal | ICD-10-CM

## 2018-05-21 NOTE — Telephone Encounter (Signed)
Please Review

## 2018-05-31 ENCOUNTER — Encounter: Payer: Self-pay | Admitting: Physician Assistant

## 2018-06-04 ENCOUNTER — Other Ambulatory Visit: Payer: Self-pay | Admitting: Physician Assistant

## 2018-06-04 DIAGNOSIS — F329 Major depressive disorder, single episode, unspecified: Secondary | ICD-10-CM

## 2018-06-04 DIAGNOSIS — F419 Anxiety disorder, unspecified: Principal | ICD-10-CM

## 2018-06-04 NOTE — Telephone Encounter (Signed)
Please Review

## 2018-06-05 NOTE — Telephone Encounter (Signed)
Please Review

## 2018-06-06 ENCOUNTER — Ambulatory Visit: Payer: BC Managed Care – PPO | Admitting: Physician Assistant

## 2018-07-07 ENCOUNTER — Other Ambulatory Visit: Payer: Self-pay | Admitting: Physician Assistant

## 2018-07-07 DIAGNOSIS — F419 Anxiety disorder, unspecified: Secondary | ICD-10-CM

## 2018-07-09 NOTE — Telephone Encounter (Signed)
Please review

## 2018-09-23 ENCOUNTER — Other Ambulatory Visit: Payer: Self-pay | Admitting: Physician Assistant

## 2018-09-23 DIAGNOSIS — Z30011 Encounter for initial prescription of contraceptive pills: Secondary | ICD-10-CM

## 2018-09-24 ENCOUNTER — Telehealth: Payer: Self-pay | Admitting: Physician Assistant

## 2018-09-24 DIAGNOSIS — Z20822 Contact with and (suspected) exposure to covid-19: Secondary | ICD-10-CM

## 2018-09-24 DIAGNOSIS — Z20828 Contact with and (suspected) exposure to other viral communicable diseases: Secondary | ICD-10-CM

## 2018-09-24 NOTE — Telephone Encounter (Signed)
She can go to [facility] [employer]. If she wants me to evaluate and place order, schedule virtual visit.

## 2018-09-24 NOTE — Telephone Encounter (Signed)
Please advise 

## 2018-09-24 NOTE — Addendum Note (Signed)
Addended by: Julieta Bellini on: 09/24/2018 05:43 PM   Modules accepted: Orders

## 2018-09-24 NOTE — Telephone Encounter (Signed)
°  Pt works at [employer] and was contacted today regarding to coworkers that tested positive for [place] 19.  Pt is requesting a Covid 19 test done asap.  Thanks [employer]

## 2018-09-24 NOTE — Telephone Encounter (Signed)
Patient was advised. Patient just wanted the screening done. Order was placed.

## 2018-09-25 ENCOUNTER — Other Ambulatory Visit: Payer: Self-pay

## 2018-09-25 DIAGNOSIS — Z20822 Contact with and (suspected) exposure to covid-19: Secondary | ICD-10-CM

## 2018-09-26 LAB — NOVEL CORONAVIRUS, NAA: SARS-CoV-2, NAA: NOT DETECTED

## 2019-01-31 ENCOUNTER — Encounter: Payer: Self-pay | Admitting: Physician Assistant

## 2019-02-01 ENCOUNTER — Other Ambulatory Visit: Payer: Self-pay

## 2019-02-01 ENCOUNTER — Encounter: Payer: Self-pay | Admitting: Physician Assistant

## 2019-02-01 ENCOUNTER — Other Ambulatory Visit: Payer: Self-pay | Admitting: Physician Assistant

## 2019-02-01 ENCOUNTER — Ambulatory Visit: Payer: BC Managed Care – PPO | Admitting: Physician Assistant

## 2019-02-01 VITALS — BP 159/97 | HR 121 | Temp 97.5°F | Resp 16 | Wt 189.0 lb

## 2019-02-01 DIAGNOSIS — R002 Palpitations: Secondary | ICD-10-CM | POA: Diagnosis not present

## 2019-02-01 DIAGNOSIS — R55 Syncope and collapse: Secondary | ICD-10-CM

## 2019-02-01 DIAGNOSIS — M2550 Pain in unspecified joint: Secondary | ICD-10-CM

## 2019-02-01 DIAGNOSIS — R12 Heartburn: Secondary | ICD-10-CM

## 2019-02-01 DIAGNOSIS — R200 Anesthesia of skin: Secondary | ICD-10-CM

## 2019-02-01 NOTE — Patient Instructions (Signed)
Syncope °Syncope is when you pass out (faint) for a short time. It is caused by a sudden decrease in blood flow to the brain. Signs that you may be about to pass out include: °· Feeling dizzy or light-headed. °· Feeling sick to your stomach (nauseous). °· Seeing all white or all black. °· Having cold, clammy skin. °If you pass out, get help right away. Call your local emergency services (911 in the [place] Do not drive yourself to the hospital. °Follow these instructions at home: °Watch for any changes in your symptoms. Take these actions to stay safe and help with your symptoms: °Lifestyle °· Do not drive, use machinery, or play sports until your doctor says it is okay. °· Do not drink alcohol. °· Do not use any products that contain nicotine or tobacco, such as cigarettes and e-cigarettes. If you need help quitting, ask your doctor. °· Drink enough fluid to keep your pee (urine) pale yellow. °General instructions °· Take over-the-counter and prescription medicines only as told by your doctor. °· If you are taking blood pressure or heart medicine, sit up and stand up slowly. Spend a few minutes getting ready to sit and then stand. This can help you feel less dizzy. °· Have someone stay with you until you feel stable. °· If you start to feel like you might pass out, lie down right away and raise (elevate) your feet above the level of your heart. Breathe deeply and steadily. Wait until all of the symptoms are gone. °· Keep all follow-up visits as told by your doctor. This is important. °Get help right away if: °· You have a very bad headache. °· You pass out once or more than once. °· You have pain in your chest, belly, or back. °· You have a very fast or uneven heartbeat (palpitations). °· It hurts to breathe. °· You are bleeding from your mouth or your bottom (rectum). °· You have black or tarry poop (stool). °· You have jerky movements that you cannot control (seizure). °· You are confused. °· You have trouble  walking. °· You are very weak. °· You have vision problems. °These symptoms may be an emergency. Do not wait to see if the symptoms will go away. Get medical help right away. Call your local emergency services (911 in the [place] Do not drive yourself to the hospital. °Summary °· Syncope is when you pass out (faint) for a short time. It is caused by a sudden decrease in blood flow to the brain. °· Signs that you may be about to faint include feeling dizzy, light-headed, or sick to your stomach, seeing all white or all black, or having cold, clammy skin. °· If you start to feel like you might pass out, lie down right away and raise (elevate) your feet above the level of your heart. Breathe deeply and steadily. Wait until all of the symptoms are gone. °This information is not intended to replace advice given to you by your health care provider. Make sure you discuss any questions you have with your health care provider. °Document Released: 07/27/2007 Document Revised: 03/22/2017 Document Reviewed: 03/22/2017 °Elsevier Patient Education © 2020 Elsevier Inc. ° °

## 2019-02-01 NOTE — Progress Notes (Signed)
Patient: Janet Duran Female    DOB: Apr 02, 1995   23 y.o.   MRN: [id number] Visit Date: 02/01/2019  Today's Provider: Trinna Post, PA-C   Chief Complaint  Patient presents with  . Dizziness   Subjective:     Dizziness This is a new problem. The current episode started in the past 7 days (5 days). The problem occurs intermittently. Associated symptoms include arthralgias, headaches, myalgias, nausea, numbness and a visual change. Pertinent negatives include no abdominal pain, anorexia, change in bowel habit, chest pain, chills, congestion, coughing, diaphoresis, fatigue, fever, joint swelling, neck pain, rash, sore throat, swollen glands, urinary symptoms, vertigo, vomiting or weakness. Nothing aggravates the symptoms. She has tried NSAIDs for the symptoms. The treatment provided no relief.    Patient states she had an episode 5 days ago where she became dizzy and her vision became fuzzy. Patient states during episode her right hand became numb and had a tingling sensation. Patient states episode lasted about 20 minutes. Patient states her vision has been fuzzy intermittently since then. Patient has also been having headache, which she is taking ibuprofen for. She denies actually passing out. She denies history of similar episodes.   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   Joint Pain: She reports low back pain, hip pain and knee pain. She denies family history of inflammatory arthritis. She denies history of rash or recent viral illnesses.   Heartburn  Weight gain: drinking calories with sweet teas and sodas. Tried drinking only water for a period of five months. She does eat fast food.  She questions if it could be the birth control.   Wt Readings from Last 3 Encounters:  02/01/19 189 lb (85.7 kg)  05/08/18 163 lb 3.2 oz (74 kg)  04/25/18 161 lb 3.2 oz (73.1 kg)   Pulse Readings from Last 3 Encounters:    02/01/19 (!) 121  05/08/18 (!) 125  04/25/18 99      No Known Allergies   Current Outpatient Medications:  .  busPIRone (BUSPAR) 15 MG tablet, Take 1 tablet (15 mg total) by mouth 2 (two) times daily. TAKE 0.5 TABLET BY MOUTH TWICE A DAY X 1 WEEK, THEN 1 TABLET IN THE MORNING AND 0.5 TABLET IN THE EVENING X 1 WEEK, THEN 2 TABLETS ONCE DAILY ONWARD, Disp: 180 tablet, Rfl: 0 .  escitalopram (LEXAPRO) 10 MG tablet, Take 1 tablet (10 mg total) by mouth daily. (Patient not taking: Reported on 02/01/2019), Disp: 90 tablet, Rfl: 0 .  hydrOXYzine (ATARAX/VISTARIL) 10 MG tablet, TAKE 1 TABLET BY MOUTH EVERY DAY AS NEEDED, Disp: 90 tablet, Rfl: 1 .  JUNEL 1/20 1-20 MG-MCG tablet, TAKE 1 TABLET BY MOUTH EVERY DAY, Disp: 63 tablet, Rfl: 4 .  ondansetron (ZOFRAN) 4 MG tablet, Take 1 tablet (4 mg total) by mouth every 8 (eight) hours as needed for nausea or vomiting., Disp: 20 tablet, Rfl: 0 .  sertraline (ZOLOFT) 50 MG tablet, Take 1 tablet (50 mg total) by mouth daily. (Patient not taking: Reported on 05/08/2018), Disp: 90 tablet, Rfl: 0  Review of Systems  Constitutional: Negative for appetite change, chills, diaphoresis, fatigue and fever.  HENT: Negative for congestion and sore throat.   Respiratory: Negative for cough, chest tightness and shortness of breath.   Cardiovascular: Negative for chest pain and palpitations.  Gastrointestinal: Positive for nausea. Negative for abdominal pain, anorexia, change in bowel  habit and vomiting.  Musculoskeletal: Positive for arthralgias and myalgias. Negative for joint swelling and neck pain.  Skin: Negative for rash.  Neurological: Positive for numbness and headaches. Negative for dizziness, vertigo and weakness.    Social History   Tobacco Use  . Smoking status: Never Smoker  . Smokeless tobacco: Never Used  Substance Use Topics  . Alcohol use: Yes      Objective:   BP (!) 159/97 (BP Location: Right Arm, Patient Position: Sitting, Cuff Size:  Large)   Pulse (!) 121   Temp (!) 97.5 F (36.4 C) (Other (Comment))   Resp 16   Wt 189 lb (85.7 kg)   SpO2 99%   BMI 34.57 kg/m  Vitals:   02/01/19 1448  BP: (!) 159/97  Pulse: (!) 121  Resp: 16  Temp: (!) 97.5 F (36.4 C)  TempSrc: Other (Comment)  SpO2: 99%  Weight: 189 lb (85.7 kg)  Body mass index is 34.57 kg/m.   Physical Exam Constitutional:      Appearance: Normal appearance.  HENT:     Head: Normocephalic and atraumatic.     Mouth/Throat:     Mouth: Mucous membranes are moist.  Eyes:     Extraocular Movements: Extraocular movements intact.     Pupils: Pupils are equal, round, and reactive to light.  Cardiovascular:     Rate and Rhythm: Normal rate and regular rhythm.     Heart sounds: Normal heart sounds.  Pulmonary:     Effort: Pulmonary effort is normal.     Breath sounds: Normal breath sounds.  Skin:    General: Skin is warm and dry.  Neurological:     Mental Status: She is alert and oriented to person, place, and time. Mental status is at baseline.  Psychiatric:        Mood and Affect: Mood normal.        Behavior: Behavior normal.      No results found for any visits on 02/01/19.     Assessment & Plan    1. Syncope, unspecified syncope type  - TSH - Comprehensive Metabolic Panel (CMET) - CBC with Differential - EKG 12-Lead - Ambulatory referral to Neurology  2. Palpitations  - EKG 12-Lead  3. Heartburn  Recommend avoiding triggers. May do OTC H2RA or PPI.   4. Arthralgia, unspecified joint  Does not sound consistent with inflammatory arthritis, more in load bearing joints. Test as below. May consider xrays.   - ANA w/Reflex - Rheumatoid Factor  5. Right arm numbness  - Ambulatory referral to Neurology  I have spent 25 minutes with this patient, >50% of time was spent on counseling and coordination of care.      Trinna Post, PA-C  [facility]

## 2019-02-02 ENCOUNTER — Encounter: Payer: Self-pay | Admitting: Physician Assistant

## 2019-02-02 LAB — CBC WITH DIFFERENTIAL/PLATELET
Basophils Absolute: 0.1 x10E3/uL (ref 0.0–0.2)
Basos: 1 %
EOS (ABSOLUTE): 0.5 x10E3/uL — ABNORMAL HIGH (ref 0.0–0.4)
Eos: 4 %
Hematocrit: 42 % (ref 34.0–46.6)
Hemoglobin: 14.5 g/dL (ref 11.1–15.9)
Immature Grans (Abs): 0 x10E3/uL (ref 0.0–0.1)
Immature Granulocytes: 0 %
Lymphocytes Absolute: 4.3 x10E3/uL — ABNORMAL HIGH (ref 0.7–3.1)
Lymphs: 37 %
MCH: 30.6 pg (ref 26.6–33.0)
MCHC: 34.5 g/dL (ref 31.5–35.7)
MCV: 89 fL (ref 79–97)
Monocytes Absolute: 1.3 x10E3/uL — ABNORMAL HIGH (ref 0.1–0.9)
Monocytes: 11 %
Neutrophils Absolute: 5.4 x10E3/uL (ref 1.4–7.0)
Neutrophils: 47 %
Platelets: 311 x10E3/uL (ref 150–450)
RBC: 4.74 x10E6/uL (ref 3.77–5.28)
RDW: 11.8 % (ref 11.7–15.4)
WBC: 11.7 x10E3/uL — ABNORMAL HIGH (ref 3.4–10.8)

## 2019-02-02 LAB — COMPREHENSIVE METABOLIC PANEL
ALT: 59 IU/L — ABNORMAL HIGH (ref 0–32)
AST: 63 IU/L — ABNORMAL HIGH (ref 0–40)
Albumin/Globulin Ratio: 1.8 (ref 1.2–2.2)
Albumin: 4.4 g/dL (ref 3.9–5.0)
Alkaline Phosphatase: 78 IU/L (ref 39–117)
BUN/Creatinine Ratio: 15 (ref 9–23)
BUN: 9 mg/dL (ref 6–20)
Bilirubin Total: <0.2 mg/dL (ref 0.0–1.2)
CO2: 21 mmol/L (ref 20–29)
Calcium: 9.9 mg/dL (ref 8.7–10.2)
Chloride: 102 mmol/L (ref 96–106)
Creatinine, Ser: 0.62 mg/dL (ref 0.57–1.00)
GFR calc Af Amer: 147 mL/min/{1.73_m2} (ref >59)
GFR calc non Af Amer: 128 mL/min/{1.73_m2} (ref >59)
Globulin, Total: 2.4 g/dL (ref 1.5–4.5)
Glucose: 129 mg/dL — ABNORMAL HIGH (ref 65–99)
Potassium: 4.1 mmol/L (ref 3.5–5.2)
Sodium: 138 mmol/L (ref 134–144)
Total Protein: 6.8 g/dL (ref 6.0–8.5)

## 2019-02-02 LAB — RHEUMATOID FACTOR: Rhuematoid fact SerPl-aCnc: <10 IU/mL (ref 0.0–13.9)

## 2019-02-02 LAB — TSH: TSH: 2.28 u[IU]/mL (ref 0.450–4.500)

## 2019-02-02 LAB — ANA W/REFLEX: Anti Nuclear Antibody (ANA): NEGATIVE

## 2019-03-04 DIAGNOSIS — G43009 Migraine without aura, not intractable, without status migrainosus: Secondary | ICD-10-CM | POA: Insufficient documentation

## 2019-03-04 DIAGNOSIS — R200 Anesthesia of skin: Secondary | ICD-10-CM | POA: Insufficient documentation

## 2019-05-02 LAB — HM HEPATITIS C SCREENING LAB: HM Hepatitis Screen: NEGATIVE

## 2019-05-03 ENCOUNTER — Other Ambulatory Visit: Payer: Self-pay | Admitting: Neurology

## 2019-05-03 DIAGNOSIS — G35 Multiple sclerosis: Secondary | ICD-10-CM

## 2019-05-05 DIAGNOSIS — G35 Multiple sclerosis: Secondary | ICD-10-CM | POA: Insufficient documentation

## 2019-05-05 DIAGNOSIS — G35A Relapsing-remitting multiple sclerosis: Secondary | ICD-10-CM | POA: Insufficient documentation

## 2019-05-05 DIAGNOSIS — H539 Unspecified visual disturbance: Secondary | ICD-10-CM | POA: Insufficient documentation

## 2019-05-05 DIAGNOSIS — R519 Headache, unspecified: Secondary | ICD-10-CM | POA: Insufficient documentation

## 2019-05-05 DIAGNOSIS — E559 Vitamin D deficiency, unspecified: Secondary | ICD-10-CM | POA: Insufficient documentation

## 2019-05-07 NOTE — Progress Notes (Signed)
Spoke with patient regarding instructions prior to LP on 05/14/19.  Verbalizes understanding, will have a driver to take her home.

## 2019-05-10 ENCOUNTER — Ambulatory Visit: Payer: BC Managed Care – PPO

## 2019-05-14 ENCOUNTER — Ambulatory Visit
Admission: RE | Admit: 2019-05-14 | Discharge: 2019-05-14 | Disposition: A | Discharge status: Home / Self Care | Payer: BC Managed Care – PPO | Source: Ambulatory Visit | Attending: Neurology | Admitting: Neurology

## 2019-05-14 ENCOUNTER — Other Ambulatory Visit: Payer: Self-pay | Admitting: Neurology

## 2019-05-14 ENCOUNTER — Ambulatory Visit: Payer: BC Managed Care – PPO

## 2019-05-14 DIAGNOSIS — G35 Multiple sclerosis: Secondary | ICD-10-CM

## 2019-05-14 LAB — PROTIME-INR
INR: 0.9 (ref 0.8–1.2)
Prothrombin Time: 12.2 seconds (ref 11.4–15.2)

## 2019-05-14 LAB — CBC WITH DIFFERENTIAL/PLATELET
Abs Immature Granulocytes: 0.04 10*3/uL (ref 0.00–0.07)
Basophils Absolute: 0.1 10*3/uL (ref 0.0–0.1)
Basophils Relative: 1 %
Eosinophils Absolute: 0.5 10*3/uL (ref 0.0–0.5)
Eosinophils Relative: 5 %
HCT: 43.8 % (ref 36.0–46.0)
Hemoglobin: 15.2 g/dL — ABNORMAL HIGH (ref 12.0–15.0)
Immature Granulocytes: 0 %
Lymphocytes Relative: 38 %
Lymphs Abs: 4.3 10*3/uL — ABNORMAL HIGH (ref 0.7–4.0)
MCH: 30.5 pg (ref 26.0–34.0)
MCHC: 34.7 g/dL (ref 30.0–36.0)
MCV: 87.8 fL (ref 80.0–100.0)
Monocytes Absolute: 0.9 10*3/uL (ref 0.1–1.0)
Monocytes Relative: 8 %
Neutro Abs: 5.3 10*3/uL (ref 1.7–7.7)
Neutrophils Relative %: 48 %
Platelets: 343 10*3/uL (ref 150–400)
RBC: 4.99 MIL/uL (ref 3.87–5.11)
RDW: 12.1 % (ref 11.5–15.5)
WBC: 11.2 10*3/uL — ABNORMAL HIGH (ref 4.0–10.5)
nRBC: 0 % (ref 0.0–0.2)

## 2019-05-14 LAB — CSF CELL COUNT WITH DIFFERENTIAL
Eosinophils, CSF: 0 %
Lymphs, CSF: 96 %
Monocyte-Macrophage-Spinal Fluid: 4 %
RBC Count, CSF: 5 /mm3 — ABNORMAL HIGH (ref 0–3)
Segmented Neutrophils-CSF: 0 %
Tube #: 3
WBC, CSF: 32 /mm3 (ref 0–5)

## 2019-05-14 LAB — GLUCOSE, CSF: Glucose, CSF: 76 mg/dL — ABNORMAL HIGH (ref 40–70)

## 2019-05-14 LAB — POCT PREGNANCY, URINE: Preg Test, Ur: NEGATIVE

## 2019-05-14 LAB — PROTEIN, CSF: Total  Protein, CSF: 29 mg/dL (ref 15–45)

## 2019-05-14 LAB — PATHOLOGIST SMEAR REVIEW

## 2019-05-14 LAB — APTT: aPTT: 33 seconds (ref 24–36)

## 2019-05-14 LAB — ALBUMIN: Albumin: 4.5 g/dL (ref 3.5–5.0)

## 2019-05-14 NOTE — OR Nursing (Signed)
Patient returned from LP, lying flat, denies any pain.

## 2019-05-14 NOTE — OR Nursing (Signed)
Dr Register in to see patient, may discharge home at 11:00.

## 2019-05-14 NOTE — OR Nursing (Signed)
Dr Register called with patient assessment and vital signs.  Dr Register at bedside assessed patient, reviewed medication list and resulted labs.  No new orders at this time.

## 2019-05-14 NOTE — Discharge Instructions (Signed)
Lumbar Puncture, Care After This sheet gives you information about how to care for yourself after your procedure. Your health care provider may also give you more specific instructions. If you have problems or questions, contact your health care provider. What can I expect after the procedure? After the procedure, it is common to have:  Mild discomfort or pain at the puncture site.  A mild headache that is relieved with pain medicines. Follow these instructions at home: Activity   Lie down flat or rest for as long as directed by your health care provider.  Return to your normal activities as told by your health care provider. Ask your health care provider what activities are safe for you.  Avoid lifting anything heavier than 10 lb (4.5 kg) for at least 12 hours after the procedure.  Do not drive for 24 hours if you were given a medicine to help you relax (sedative) during your procedure.  Do not drive or use heavy machinery while taking prescription pain medicine. Puncture site care  Remove or change your bandage (dressing) as told by your health care provider.  Check your puncture area every day for signs of infection. Check for: ? More pain. ? Redness or swelling. ? Fluid or blood leaking from the puncture site. ? Warmth. ? Pus or a bad smell. General instructions  Take over-the-counter and prescription medicines only as told by your health care provider.  Drink enough fluids to keep your urine clear or pale yellow. Your health care provider may recommend drinking caffeine to prevent a headache.  Keep all follow-up visits as told by your health care provider. This is important. Contact a health care provider if:  You have fever or chills.  You have nausea or vomiting.  You have a headache that lasts for more than 2 days or does not get better with medicine. Get help right away if:  You develop any of the following in your  legs: ? Weakness. ? Numbness. ? Tingling.  You are unable to control when you urinate or have a bowel movement (incontinence).  You have signs of infection around your puncture site, such as: ? More pain. ? Redness or swelling. ? Fluid or blood leakage. ? Warmth. ? Pus or a bad smell.  You are dizzy or you feel like you might faint.  You have a severe headache, especially when you sit or stand. Summary  A lumbar puncture is a procedure in which a small needle is inserted into the lower back to remove fluid that surrounds the brain and spinal cord.  After this procedure, it is common to have a headache and pain around the needle insertion area.  Lying flat, staying hydrated, and drinking caffeine can help prevent headaches.  Monitor your needle insertion site for signs of infection, including warmth, fluid, or more pain.  Get help right away if you develop leg weakness, leg numbness, incontinence, or severe headaches. This information is not intended to replace advice given to you by your health care provider. Make sure you discuss any questions you have with your health care provider. Document Revised: 03/23/2016 Document Reviewed: 03/23/2016 Elsevier Patient Education  2020 [place] until  [id number]

## 2019-05-14 NOTE — OR Nursing (Signed)
Critical value of wbc in C[facility] of 32 called to Dr Melrose Nakayama with abnormal glucose as well.  No new orders.  Patient can be discharged as planned with scheduled f/u appointment.

## 2019-05-14 NOTE — Progress Notes (Signed)
Pt stable.Vss.Back stable.D/c instructions given.F/u with her MD.

## 2019-05-14 NOTE — OR Nursing (Signed)
Patient instructed to f/u with PCP related to bp/hr.  Provider out of office til 05/17/2019. Patient verbalized understanding and will return with any concerns.  Patient may be discharged home per Dr Register.  Patient denies any pain or concerns.  Discharge instructions given.

## 2019-05-14 NOTE — OR Nursing (Signed)
Patient denies any s/s of Covid or infection.  Negative  upreg and currently on cycle

## 2019-05-16 LAB — IGG CSF INDEX
Albumin CSF-mCnc: 16 mg/dL (ref 11–48)
Albumin: 5.2 g/dL — ABNORMAL HIGH (ref 3.9–5.0)
CSF IgG Index: 2 — ABNORMAL HIGH (ref 0.0–0.7)
IgG (Immunoglobin G), Serum: 926 mg/dL (ref 586–1602)
IgG, CSF: 5.7 mg/dL (ref 0.0–8.6)
IgG/Alb Ratio, CSF: 0.36 — ABNORMAL HIGH (ref 0.00–0.25)

## 2019-05-17 LAB — OLIGOCLONAL BANDS, CSF + SERM

## 2019-05-17 LAB — CSF CULTURE W GRAM STAIN
Culture: NO GROWTH
Gram Stain: NONE SEEN

## 2019-05-19 LAB — HSV(HERPES SMPLX VRS)ABS-I+II(IGG)-CSF: HSV Type I/II Ab, IgG CSF: <0.34 IV (ref <=0.89)

## 2019-05-27 DIAGNOSIS — E538 Deficiency of other specified B group vitamins: Secondary | ICD-10-CM | POA: Insufficient documentation

## 2019-05-27 DIAGNOSIS — H538 Other visual disturbances: Secondary | ICD-10-CM | POA: Insufficient documentation

## 2019-05-27 DIAGNOSIS — R200 Anesthesia of skin: Secondary | ICD-10-CM | POA: Insufficient documentation

## 2019-08-30 ENCOUNTER — Other Ambulatory Visit: Payer: Self-pay

## 2019-08-30 ENCOUNTER — Encounter: Payer: Self-pay | Admitting: Physician Assistant

## 2019-08-30 ENCOUNTER — Ambulatory Visit: Payer: BC Managed Care – PPO | Admitting: Physician Assistant

## 2019-08-30 VITALS — BP 128/86 | HR 128 | Temp 96.9°F | Ht 62.0 in | Wt 188.8 lb

## 2019-08-30 DIAGNOSIS — F32A Depression, unspecified: Secondary | ICD-10-CM

## 2019-08-30 DIAGNOSIS — E669 Obesity, unspecified: Secondary | ICD-10-CM

## 2019-08-30 DIAGNOSIS — F329 Major depressive disorder, single episode, unspecified: Secondary | ICD-10-CM

## 2019-08-30 DIAGNOSIS — Z30011 Encounter for initial prescription of contraceptive pills: Secondary | ICD-10-CM

## 2019-08-30 DIAGNOSIS — G35 Multiple sclerosis: Secondary | ICD-10-CM

## 2019-08-30 DIAGNOSIS — Z0000 Encounter for general adult medical examination without abnormal findings: Secondary | ICD-10-CM

## 2019-08-30 MED ORDER — NORETHINDRONE ACET-ETHINYL EST 1-20 MG-MCG PO TABS: 1.0000 | ORAL_TABLET | Freq: Every day | ORAL | 4 refills | Status: DC

## 2019-08-30 NOTE — Patient Instructions (Signed)
Preventive Care 21-24 Years Old, Female Preventive care refers to visits with your health care provider and lifestyle choices that can promote health and wellness. This includes:  A yearly physical exam. This may also be called an annual well check.  Regular dental visits and eye exams.  Immunizations.  Screening for certain conditions.  Healthy lifestyle choices, such as eating a healthy diet, getting regular exercise, not using drugs or products that contain nicotine and tobacco, and limiting alcohol use. What can I expect for my preventive care visit? Physical exam Your health care provider will check your:  Height and weight. This may be used to calculate body mass index (BMI), which tells if you are at a healthy weight.  Heart rate and blood pressure.  Skin for abnormal spots. Counseling Your health care provider may ask you questions about your:  Alcohol, tobacco, and drug use.  Emotional well-being.  Home and relationship well-being.  Sexual activity.  Eating habits.  Work and work environment.  Method of birth control.  Menstrual cycle.  Pregnancy history. What immunizations do I need?  Influenza (flu) vaccine  This is recommended every year. Tetanus, diphtheria, and pertussis (Tdap) vaccine  You may need a Td booster every 10 years. Varicella (chickenpox) vaccine  You may need this if you have not been vaccinated. Human papillomavirus (HPV) vaccine  If recommended by your health care provider, you may need three doses over 6 months. Measles, mumps, and rubella (MMR) vaccine  You may need at least one dose of MMR. You may also need a second dose. Meningococcal conjugate (MenACWY) vaccine  One dose is recommended if you are age 19-21 years and a first-year college student living in a residence hall, or if you have one of several medical conditions. You may also need additional booster doses. Pneumococcal conjugate (PCV13) vaccine  You may need  this if you have certain conditions and were not previously vaccinated. Pneumococcal polysaccharide (PPSV23) vaccine  You may need one or two doses if you smoke cigarettes or if you have certain conditions. Hepatitis A vaccine  You may need this if you have certain conditions or if you travel or work in places where you may be exposed to hepatitis A. Hepatitis B vaccine  You may need this if you have certain conditions or if you travel or work in places where you may be exposed to hepatitis B. Haemophilus influenzae type b (Hib) vaccine  You may need this if you have certain conditions. You may receive vaccines as individual doses or as more than one vaccine together in one shot (combination vaccines). Talk with your health care provider about the risks and benefits of combination vaccines. What tests do I need?  Blood tests  Lipid and cholesterol levels. These may be checked every 5 years starting at age 20.  Hepatitis C test.  Hepatitis B test. Screening  Diabetes screening. This is done by checking your blood sugar (glucose) after you have not eaten for a while (fasting).  Sexually transmitted disease (STD) testing.  BRCA-related cancer screening. This may be done if you have a family history of breast, ovarian, tubal, or peritoneal cancers.  Pelvic exam and Pap test. This may be done every 3 years starting at age 21. Starting at age 30, this may be done every 5 years if you have a Pap test in combination with an HPV test. Talk with your health care provider about your test results, treatment options, and if necessary, the need for more tests.   Follow these instructions at home: Eating and drinking   Eat a diet that includes fresh fruits and vegetables, whole grains, lean protein, and low-fat dairy.  Take vitamin and mineral supplements as recommended by your health care provider.  Do not drink alcohol if: ? Your health care provider tells you not to drink. ? You are  pregnant, may be pregnant, or are planning to become pregnant.  If you drink alcohol: ? Limit how much you have to 0-1 drink a day. ? Be aware of how much alcohol is in your drink. In the [place] one drink equals one 12 oz bottle of beer (355 mL), one 5 oz glass of wine (148 mL), or one 1 oz glass of hard liquor (44 mL). Lifestyle  Take daily care of your teeth and gums.  Stay active. Exercise for at least 30 minutes on 5 or more days each week.  Do not use any products that contain nicotine or tobacco, such as cigarettes, e-cigarettes, and chewing tobacco. If you need help quitting, ask your health care provider.  If you are sexually active, practice safe sex. Use a condom or other form of birth control (contraception) in order to prevent pregnancy and STIs (sexually transmitted infections). If you plan to become pregnant, see your health care provider for a preconception visit. What's next?  Visit your health care provider once a year for a well check visit.  Ask your health care provider how often you should have your eyes and teeth checked.  Stay up to date on all vaccines. This information is not intended to replace advice given to you by your health care provider. Make sure you discuss any questions you have with your health care provider. Document Revised: 10/19/2017 Document Reviewed: 10/19/2017 Elsevier Patient Education  2020 [employer].

## 2019-08-30 NOTE — Progress Notes (Signed)
Complete physical exam   Patient: Janet Duran   DOB: February 08, 1996   24 y.o. Female  MRN: [id number] Visit Date: 08/30/2019  Today's healthcare provider: Trinna Post, PA-C   Chief Complaint  Patient presents with  . Annual Exam  I,Porsha C McClurkin,acting as a scribe for Trinna Post, PA-C.,have documented all relevant documentation on the behalf of Trinna Post, PA-C,as directed by  Trinna Post, PA-C while in the presence of Trinna Post, PA-C.  Subjective    Janet Duran is a 24 y.o. female who presents today for a complete physical exam.  She reports consuming a general diet. Home exercise routine includes treadmill. She generally feels poorly. She reports sleeping poorly. She does have additional problems to discuss today.  HPI   Patient reports she has a lump on her right outer ear since 08/24/2019. She reports it is painful and she did not hit her face or ear on anything. Patient reports that it was a size of a quarter but has went down a little. Denies ear pain, fever, pain, swelling.   Depression, Follow-up  She  was last seen for this 7 months ago. Changes made at last visit include starting Buspar.   She reports poor compliance with treatment. She is not having side effects. She has discontinued the medication. Previous intolerance to zoloft.   She reports poor tolerance of treatment. Current symptoms include: depressed mood She feels she is Unchanged since last visit.  Depression screen [facility] 2/9 08/30/2019 03/14/2018 02/01/2018  Decreased Interest 2 3 1   Down, Depressed, Hopeless 2 2 1   PHQ - 2 Score 4 5 2   Altered sleeping 2 3 3   Tired, decreased energy 3 3 3   Change in appetite 3 2 1   Feeling bad or failure about yourself  1 1 1   Trouble concentrating 2 1 1   Moving slowly or fidgety/restless 1 0 0  Suicidal thoughts 0 0 0  PHQ-9 Score 16 15 11   Difficult doing work/chores Very difficult Very difficult Somewhat difficult     -----------------------------------------------------------------------------------------  Continues with Junel for contraception. No issues. No history of heart attack, blood clot, stroke.   In the interim, she has been diagnosed with MS. She is followed by [facility] and is treated with once monthly infusion Tysabri.   Past Medical History:  Diagnosis Date  . Anxiety   . Depression   . Ectopic beat   . Ectopic heartbeat    No past surgical history on file. Social History   Socioeconomic History  . Marital status: Single    Spouse name: Not on file  . Number of children: Not on file  . Years of education: Not on file  . Highest education level: Not on file  Occupational History  . Not on file  Tobacco Use  . Smoking status: Never Smoker  . Smokeless tobacco: Never Used  Substance and Sexual Activity  . Alcohol use: Yes  . Drug use: No  . Sexual activity: Not on file  Other Topics Concern  . Not on file  Social History Narrative  . Not on file   Social Determinants of Health   Financial Resource Strain:   . Difficulty of Paying Living Expenses:   Food Insecurity:   . Worried About Charity fundraiser in the Last Year:   . Arboriculturist in the Last Year:   Transportation Needs:   . Film/video editor (Medical):   Marland Kitchen  Lack of Transportation (Non-Medical):   Physical Activity:   . Days of Exercise per Week:   . Minutes of Exercise per Session:   Stress:   . Feeling of Stress :   Social Connections:   . Frequency of Communication with Friends and Family:   . Frequency of Social Gatherings with Friends and Family:   . Attends Religious Services:   . Active Member of Clubs or Organizations:   . Attends Archivist Meetings:   Marland Kitchen Marital Status:   Intimate Partner Violence:   . Fear of Current or Ex-Partner:   . Emotionally Abused:   Marland Kitchen Physically Abused:   . Sexually Abused:    No family status information on file.   No family  history on file. No Known Allergies  Patient Care Team: Paulene Floor as PCP - General (Physician Assistant)   Medications: Outpatient Medications Prior to Visit  Medication Sig  . cholecalciferol (VITAMIN D3) 25 MCG (1000 UNIT) tablet Take 1,000 Units by mouth daily.  . nortriptyline (PAMELOR) 25 MG capsule Take 25 mg by mouth at bedtime.  . rizatriptan (MAXALT-MLT) 10 MG disintegrating tablet Take 10 mg by mouth as needed for migraine. May repeat in 2 hours if needed  . vitamin B-12 (CYANOCOBALAMIN) 1000 MCG tablet Take 1,000 mcg by mouth daily.  . [DISCONTINUED] JUNEL 1/20 1-20 MG-MCG tablet TAKE 1 TABLET BY MOUTH EVERY DAY  . busPIRone (BUSPAR) 15 MG tablet Take 1 tablet (15 mg total) by mouth 2 (two) times daily. TAKE 0.5 TABLET BY MOUTH TWICE A DAY X 1 WEEK, THEN 1 TABLET IN THE MORNING AND 0.5 TABLET IN THE EVENING X 1 WEEK, THEN 2 TABLETS ONCE DAILY ONWARD  . hydrOXYzine (ATARAX/VISTARIL) 10 MG tablet TAKE 1 TABLET BY MOUTH EVERY DAY AS NEEDED  . ondansetron (ZOFRAN) 4 MG tablet Take 1 tablet (4 mg total) by mouth every 8 (eight) hours as needed for nausea or vomiting.   No facility-administered medications prior to visit.    Review of Systems  Constitutional: Negative.   HENT: Negative.   Eyes: Negative.   Respiratory: Negative.   Cardiovascular: Negative.   Gastrointestinal: Negative.   Endocrine: Negative.   Genitourinary: Negative.   Musculoskeletal: Negative.   Skin: Negative.   Allergic/Immunologic: Negative.   Neurological: Negative.   Hematological: Negative.   Psychiatric/Behavioral: Negative.       Objective    BP 128/86 (BP Location: Right Arm, Patient Position: Sitting, Cuff Size: Normal)   Pulse (!) 128   Temp (!) 96.9 F (36.1 C) (Temporal)   Ht 5' 2"  (1.575 m)   Wt 188 lb 12.8 oz (85.6 kg)   SpO2 97%   BMI 34.53 kg/m    Physical Exam Constitutional:      Appearance: Normal appearance.  HENT:     Right Ear: Tympanic membrane, ear  canal and external ear normal.     Left Ear: Tympanic membrane, ear canal and external ear normal.  Cardiovascular:     Rate and Rhythm: Normal rate and regular rhythm.     Pulses: Normal pulses.     Heart sounds: Normal heart sounds.  Pulmonary:     Effort: Pulmonary effort is normal.     Breath sounds: Normal breath sounds.  Abdominal:     General: Abdomen is flat. Bowel sounds are normal.     Palpations: Abdomen is soft.  Skin:    General: Skin is warm and dry.  Neurological:     General:  No focal deficit present.     Mental Status: She is alert and oriented to person, place, and time.  Psychiatric:        Mood and Affect: Mood normal.        Behavior: Behavior normal.       Last depression screening scores PHQ 2/9 Scores 08/30/2019 03/14/2018 02/01/2018  PHQ - 2 Score 4 5 2   PHQ- 9 Score 16 15 11    Last fall risk screening Fall Risk  08/30/2019  Falls in the past year? 0  Number falls in past yr: 0  Injury with Fall? 0   Last Audit-C alcohol use screening Alcohol Use Disorder Test (AUDIT) 08/30/2019  1. How often do you have a drink containing alcohol? 1  2. How many drinks containing alcohol do you have on a typical day when you are drinking? 0  3. How often do you have six or more drinks on one occasion? 0  AUDIT-C Score 1   A score of 3 or more in women, and 4 or more in men indicates increased risk for alcohol abuse, EXCEPT if all of the points are from question 1   Results for orders placed or performed in visit on 08/30/19  [facility] HEPATITIS C SCREENING LAB  Result Value Ref Range   HM Hepatitis Screen Negative-Validated     Assessment & Plan    Routine Health Maintenance and Physical Exam  Exercise Activities and Dietary recommendations Goals   None     Immunization History  Administered Date(s) Administered  . DTaP 10/03/1995, 02/12/1996, 05/22/1996, 12/09/1998  . Hepatitis A 07/30/2007, 09/22/2009  . Hepatitis B 15-Jan-1996, 08/29/1995, 02/12/1996  .  HiB (PRP-OMP) 10/03/1995, 02/12/1996, 05/22/1996, 12/09/1998  . Hpv 07/30/2007, 09/22/2009, 07/23/2013  . IPV 10/03/1995, 11/30/1995, 12/09/1998, 06/27/2006  . MMR 12/09/1998, 06/27/2006  . Meningococcal Conjugate 03/16/2007, 07/23/2013  . Td 06/27/2006, 07/23/2013  . Tdap 07/23/2013    Health Maintenance  Topic Date Due  . COVID-19 Vaccine (1) Never done  . INFLUENZA VACCINE  09/22/2019  . PAP-Cervical Cytology Screening  02/01/2021  . PAP SMEAR-Modifier  02/01/2021  . TETANUS/TDAP  07/24/2023  . Hepatitis C Screening  Completed  . HIV Screening  Completed    Discussed health benefits of physical activity, and encouraged her to engage in regular exercise appropriate for her age and condition.  1. Annual physical exam   2. Encounter for initial prescription of contraceptive pills  - norethindrone-ethinyl estradiol (JUNEL 1/20) 1-20 MG-MCG tablet; Take 1 tablet by mouth daily.  Dispense: 63 tablet; Refill: 4  3. Depression, unspecified depression type  She declines further treatment of this, medication or counseling.  4. Multiple sclerosis ([place])  Followed by [facility].   5. Class 1 obesity without serious comorbidity with body mass index (BMI) of 34.0 to 34.9 in adult, unspecified obesity type    Return in about 1 year (around 08/29/2020) for CPE.     ITrinna Post, PA-C, have reviewed all documentation for this visit. The documentation on 09/03/19 for the exam, diagnosis, procedures, and orders are all accurate and complete.    Paulene Floor  [facility] [phone] (phone) [phone] (fax)  [place]

## 2019-09-03 DIAGNOSIS — Z6834 Body mass index (BMI) 34.0-34.9, adult: Secondary | ICD-10-CM | POA: Insufficient documentation

## 2019-09-03 DIAGNOSIS — F32A Depression, unspecified: Secondary | ICD-10-CM | POA: Insufficient documentation

## 2019-09-03 DIAGNOSIS — E669 Obesity, unspecified: Secondary | ICD-10-CM | POA: Insufficient documentation

## 2019-09-03 DIAGNOSIS — F419 Anxiety disorder, unspecified: Secondary | ICD-10-CM | POA: Insufficient documentation

## 2019-12-26 ENCOUNTER — Encounter: Payer: Self-pay | Admitting: Neurology

## 2019-12-26 ENCOUNTER — Telehealth: Payer: Self-pay | Admitting: *Deleted

## 2019-12-26 ENCOUNTER — Ambulatory Visit (INDEPENDENT_AMBULATORY_CARE_PROVIDER_SITE_OTHER): Payer: 59 | Admitting: Neurology

## 2019-12-26 ENCOUNTER — Other Ambulatory Visit: Payer: Self-pay

## 2019-12-26 VITALS — BP 137/96 | HR 88 | Ht 62.0 in | Wt 186.0 lb

## 2019-12-26 DIAGNOSIS — Z79899 Other long term (current) drug therapy: Secondary | ICD-10-CM | POA: Insufficient documentation

## 2019-12-26 DIAGNOSIS — R3915 Urgency of urination: Secondary | ICD-10-CM | POA: Insufficient documentation

## 2019-12-26 DIAGNOSIS — G35 Multiple sclerosis: Secondary | ICD-10-CM | POA: Diagnosis not present

## 2019-12-26 DIAGNOSIS — H539 Unspecified visual disturbance: Secondary | ICD-10-CM | POA: Diagnosis not present

## 2019-12-26 DIAGNOSIS — G43109 Migraine with aura, not intractable, without status migrainosus: Secondary | ICD-10-CM | POA: Diagnosis not present

## 2019-12-26 HISTORY — DX: Urgency of urination: R39.15

## 2019-12-26 MED ORDER — TOLTERODINE TARTRATE ER 4 MG PO CP24: 4.0000 mg | ORAL_CAPSULE | Freq: Every day | ORAL | 3 refills | Status: DC

## 2019-12-26 MED ORDER — VITAMIN D (ERGOCALCIFEROL) 1.25 MG (50000 UNIT) PO CAPS: 50000.0000 [IU] | ORAL_CAPSULE | ORAL | 3 refills | Status: DC

## 2019-12-26 NOTE — Telephone Encounter (Signed)
Faxed complete/signed Tysabri start form to MS touch prescribing program at [phone]. Received fax confirmation. Gave completed start form to intrafusion for them to process. Included office visit notes,signed order for intrafusion, labs, MRI. Sent copy of start form to be scanned to epic.   

## 2019-12-26 NOTE — Progress Notes (Addendum)
[facility]  PATIENT: Janet Duran DOB: 1995-06-12  REFERRING DOCTOR OR PCP: Gurney Maxin, MD SOURCE: Patient, notes from Dr. Melrose Nakayama, imaging and lab reports, MRI images personally reviewed.  _________________________________   HISTORICAL  CHIEF COMPLAINT:  Chief Complaint  Patient presents with  . New Patient (Initial Visit)    RM 12, alone. Paper referral from Anabel Bene, MD for MS. She moved and wanted a neurologist closer to home. Dx with MS March/April 2021. Having intermittent numb/ting in extremities, intemittent blurry vision, back pain.  Saw eye doctor at the beginning of the year.   . Multiple Sclerosis    Received first dose of Tysabri 07/04/19. Receives q 6 weeks. Last labs completed august/sep 2021 (JCV)  . Migraine    HISTORY OF PRESENT ILLNESS:  I had the pleasure of seeing patient, Janet Duran, at the [place] [facility] for neurologic consultation regarding her recent diagnosis of relapsing remitting multiple sclerosis and other symptoms.  She is a 24 year old woman who was diagnosed with MS in March 2021 after presenting with fuzzy vision in both eyes and numbness in her right hand.   She had a migraine at the time.   Symptoms improved after 15 minutes.   She saw her PCP for suspected complicated migraine and was referred to Dr. Melrose Nakayama.   She had a brain MRI which was consistent with MS.   She also had a cervical spine MRI.   To confirm the diagnosis, she underwent an LP.  It showed oligoclonal bands confirming the diagnosis of MS.    She was started on Tysabri.   She is on Tysabri 300 mg every 6 weeks and tolerates it well.    She has not had not had any exacerbations.   Her next infusion is scheduled for 01/24/2020.   Her last JCV antibody was 0.10 (negative)  08/07/2019.  Currently, she is noting some blurriness with her vision (bilateral).  She notes being lightheaded and has presyncopa but has not  had actual syncope.   She is always hot.   Gait is fine though she has stumbles when lightheaded.   She denies numbness or weakness in her arms or legs.   She notes urinary urgency with mild incontinence over the past 2 months.     She gets a popping sensation in her neck when she bends it but does not have a classic Lhermitte sign.    She has fatigue which seemed to improve with Tysabri but is now more common.    She sleeps well most nights -- 8 hours on average..    She notes depression and anxiety, that started last year and worsened this year.   She is not currently on any medication.   Buspar had not helped anxiety and a couple other medications did not help depression.   She notes mild brain fog and has had trouble coming up with the right word at times.    She works at [employer].     She was taking vitamin D supplements 1000 mg daily but recently stopped.  Vitamin D was deficient at 15.3.Marland Kitchen  She had three B12 injectons for deficiency (184) and was taking pills for a while but stopped. .     She has some migraine headache.     IMAGING MRI of the brain 04/27/2019 showed multiple T2/FLAIR hyperintense foci in the hemispheres, many in the periventricular white matter radially oriented to the ventricles.  The foci was  also present in the lateral left thalamus, right pons and right cerebellum.  None of the foci enhanced.  MRI of the cervical spine showed abnormal T2 hyperintense foci to the left adjacent to C6 and centrally adjacent to C4-C5.  The C6 focus enhanced after contrast.  LABS Lab work from 05/02/2019 was reviewed.  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  Family History: She is adopted.      REVIEW OF SYSTEMS: Constitutional: No fevers, chills, sweats, or change in appetite Eyes: No visual changes, double vision, eye pain Ear, nose and throat: No hearing loss, ear pain, nasal congestion, sore throat Cardiovascular: No chest pain, palpitations Respiratory: No shortness of breath at rest or with exertion.   No wheezes GastrointestinaI: No nausea, vomiting, diarrhea, abdominal pain, fecal incontinence Genitourinary: No dysuria, urinary retention or frequency.  No nocturia. Musculoskeletal: No neck pain, back pain Integumentary: No rash, pruritus, skin lesions Neurological: as above Psychiatric: No depression at this time.  No anxiety Endocrine: No palpitations, diaphoresis, change in appetite, change in weigh or increased thirst Hematologic/Lymphatic: No anemia, purpura, petechiae. Allergic/Immunologic: No itchy/runny eyes, nasal congestion, recent allergic reactions, rashes  ALLERGIES: No Known Allergies  HOME MEDICATIONS:  Current Outpatient Medications:  .  norethindrone-ethinyl estradiol (JUNEL 1/20) 1-20 MG-MCG tablet, Take 1 tablet by mouth daily., Disp: 63 tablet, Rfl: 4 .  cholecalciferol (VITAMIN D3) 25 MCG (1000 UNIT) tablet, Take 1,000 Units by mouth daily. (Patient not taking: Reported on 12/26/2019), Disp: , Rfl:  .  nortriptyline (PAMELOR) 25 MG capsule, Take 25 mg by mouth at bedtime. (Patient not taking: Reported on 12/26/2019), Disp: , Rfl:  .  rizatriptan (MAXALT-MLT) 10 MG disintegrating tablet, Take 10 mg by mouth as needed for migraine. May repeat in 2 hours if needed (Patient not taking: Reported on 12/26/2019), Disp: , Rfl:  .  vitamin B-12 (CYANOCOBALAMIN) 1000 MCG tablet, Take 1,000 mcg by mouth daily. (Patient not taking: Reported on 12/26/2019), Disp: , Rfl:  .  Vitamin D, Ergocalciferol, (DRISDOL) 1.25 MG (50000 UNIT) CAPS capsule, Take 1 capsule (50,000 Units total) by mouth every 7 (seven) days.,  Disp: 13 capsule, Rfl: 3  PAST MEDICAL HISTORY: Past Medical History:  Diagnosis Date  . Anxiety   . Depression   . Ectopic beat   . Ectopic heartbeat   . Migraine   . Multiple sclerosis ([place])     PAST SURGICAL HISTORY: Past Surgical History:  Procedure Laterality Date  . NO PAST SURGERIES      FAMILY HISTORY: Family History  Adopted: Yes    SOCIAL HISTORY:  Social History   Socioeconomic History  . Marital status: Single    Spouse name: Not on file  . Number of children: 0  . Years of education: Bachelors   . Highest education level: Not on file  Occupational History  . 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 Smoking status: Never Smoker  . Smokeless tobacco: Never Used  Substance and Sexual Activity  . Alcohol use: Yes    Comment: rare  . Drug use: No  . Sexual activity: Not on file  Other Topics Concern  . Not on file  Social History Narrative   Right handed  Lives with boyfriend   Caffeine use: Soda daily ( 1 per day)   Social Determinants of Health   Financial Resource Strain:   . Difficulty of Paying Living Expenses: Not on file  Food Insecurity:   . Worried About Charity fundraiser in the Last Year: Not on file  . Ran Out of Food in the Last Year: Not on file  Transportation Needs:   . Lack of Transportation (Medical): Not on file  . Lack of Transportation (Non-Medical): Not on file  Physical Activity:   . Days of Exercise per Week: Not on file  . Minutes of Exercise per Session: Not on file  Stress:   . Feeling of Stress : Not on file  Social Connections:   . Frequency of Communication with Friends and Family: Not on file  . Frequency of Social Gatherings with Friends and Family: Not on file  . Attends Religious Services: Not on file  . Active Member of Clubs or Organizations: Not on file  . Attends Archivist Meetings: Not on file  . Marital Status: Not on file  Intimate Partner Violence:   . Fear of Current or  Ex-Partner: Not on file  . Emotionally Abused: Not on file  . Physically Abused: Not on file  . Sexually Abused: Not on file     PHYSICAL EXAM  Vitals:   12/26/19 1328  BP: (!) 137/96  Pulse: 88  Weight: 186 lb (84.4 kg)  Height: 5\' 2"  (1.575 m)    Body mass index is 34.02 kg/m.   Hearing Screening   125Hz  250Hz  500Hz  1000Hz  2000Hz  3000Hz  4000Hz  6000Hz  8000Hz   Right ear:           Left ear:             Visual Acuity Screening   Right eye Left eye Both eyes  Without correction: 20/30 20/30 20/20   With correction:       General: The patient is well-developed and well-nourished and in no acute distress  HEENT:  Head is [place]/AT.  Sclera are anicteric.  Funduscopic exam shows normal optic discs and retinal vessels.  Neck: No carotid bruits are noted.  The neck is nontender.  Cardiovascular: The heart has a regular rate and rhythm with a normal S1 and S2. There were no murmurs, gallops or rubs.    Skin: Extremities are without rash or  edema.  Musculoskeletal:  Back is nontender  Neurologic Exam  Mental status: The patient is alert and oriented x 3 at the time of the examination. The patient has apparent normal recent and remote memory, with an apparently normal attention span and concentration ability.   Speech is normal.  Cranial nerves: Extraocular movements are full. Pupils are equal, round, and reactive to light and accomodation.  Visual fields are full.  Facial symmetry is present. There is good facial sensation to soft touch bilaterally.Facial strength is normal.  Trapezius and sternocleidomastoid strength is normal. No dysarthria is noted.  The tongue is midline, and the patient has symmetric elevation of the soft palate. No obvious hearing deficits are noted.  Motor:  Muscle bulk is normal.   Tone is normal. Strength is  5 / 5 in all 4 extremities.   Sensory: Sensory testing is intact to pinprick, soft touch and vibration sensation in all 4  extremities.  Coordination: Cerebellar testing reveals good finger-nose-finger and heel-to-shin bilaterally.  Gait and station: Station is normal.   Gait is normal. Tandem gait is mildly  wide.   Romberg is negative.   Reflexes: Deep tendon reflexes are symmetric and normal bilaterally.   Plantar responses are flexor.    DIAGNOSTIC DATA (LABS, IMAGING, TESTING) - I reviewed patient records, labs, notes, testing and imaging myself where available.  Lab Results  Component Value Date   WBC 11.2 (H) 05/14/2019   HGB 15.2 (H) 05/14/2019   HCT 43.8 05/14/2019   MCV 87.8 05/14/2019   PLT 343 05/14/2019      Component Value Date/Time   NA 138 02/01/2019 1543   K 4.1 02/01/2019 1543   CL 102 02/01/2019 1543   CO2 21 02/01/2019 1543   GLUCOSE 129 (H) 02/01/2019 1543   BUN 9 02/01/2019 1543   CREATININE 0.62 02/01/2019 1543   CALCIUM 9.9 02/01/2019 1543   PROT 6.8 02/01/2019 1543   ALBUMIN 4.5 05/14/2019 0747   ALBUMIN 5.2 (H) 05/14/2019 0747   AST 63 (H) 02/01/2019 1543   ALT 59 (H) 02/01/2019 1543   ALKPHOS 78 02/01/2019 1543   BILITOT <0.2 02/01/2019 1543   GFRNONAA 128 02/01/2019 1543   GFRAA 147 02/01/2019 1543   Lab Results  Component Value Date   CHOL 227 (H) 02/01/2018   HDL 50 02/01/2018   LDLCALC 136 (H) 02/01/2018   TRIG 204 (H) 02/01/2018   CHOLHDL 4.5 (H) 02/01/2018   No results found for: HGBA1C No results found for: VITAMINB12 Lab Results  Component Value Date   TSH 2.280 02/01/2019       ASSESSMENT AND PLAN  Multiple sclerosis ([place]) - Plan: Stratify JCV Antibody Test ([facility])  High risk medication use  Vision changes  Migraine with aura and without status migrainosus, not intractable   In summary, Ms. House is a 24 year old woman who was diagnosed with multiple sclerosis earlier this year.  Her exam is currently normal.  She is currently on Tysabri therapy and tolerating well.  We discussed the risks and benefits of Tysabri and she will  continue on this medication.  She is getting IV treatments every 6 weeks.  She has moved closer to our office and we will change her infusion center to our office.   We will check an MRI of the brain to determine if there is any subclinical progression and consider a different therapy if this is occurring.  I will check the JCV antibody again today and will discuss further if it has converted to positive as her risk would be higher than.  She will take high-dose vitamin D and I sent in a prescription.  Additionally she is having urinary urgency with some incontinence and I sent in a prescription for Detrol LA.  She will return to see me in 6 months or sooner for new or worsening neurologic symptoms.  Thank you for asking me to see Ms. Krider.  Please let me know if I can be of further assistance with her or other patients in the future.   Seretha Estabrooks A. Felecia Shelling, MD, [facility] 14/10/7024, 3:78 PM Certified in Neurology, Clinical Neurophysiology, Sleep Medicine and Neuroimaging  [facility] [address], [place], [place] [address] [phone]

## 2019-12-26 NOTE — Telephone Encounter (Signed)
[facility] at [phone]. Requested that [facility] report for JCV ab lab results that were recently collected be sent to [place] via fax at [phone]. They advised they will get this faxed over.

## 2019-12-26 NOTE — Telephone Encounter (Signed)
Placed JCV lab in [facility] lock box for routine lab pick up. Results pending. 

## 2019-12-26 NOTE — Telephone Encounter (Signed)
Received report via fax. JCV ab drawn on 08/07/19 negative, index: 0.10.

## 2019-12-30 ENCOUNTER — Telehealth: Payer: Self-pay | Admitting: *Deleted

## 2019-12-30 ENCOUNTER — Telehealth: Payer: Self-pay | Admitting: Neurology

## 2019-12-30 NOTE — Telephone Encounter (Signed)
Request faxed to [phone]

## 2019-12-30 NOTE — Telephone Encounter (Signed)
no to the covid questions MR Brain w/wo contrast Dr. Cheree Ditto Auth: [id number] (exp. 12/30/19 to 06/26/20) & [employer] [name]: [id number] (exp. 12/30/19 to 02/13/20. Patient is scheduled at [facility] for 01/07/20.

## 2020-01-01 NOTE — Telephone Encounter (Signed)
JCV ab drawn on 12/26/19 negative, index: 0.14

## 2020-01-07 ENCOUNTER — Telehealth: Payer: Self-pay | Admitting: *Deleted

## 2020-01-07 ENCOUNTER — Ambulatory Visit (INDEPENDENT_AMBULATORY_CARE_PROVIDER_SITE_OTHER): Payer: BC Managed Care – PPO

## 2020-01-07 DIAGNOSIS — G35 Multiple sclerosis: Secondary | ICD-10-CM

## 2020-01-07 MED ORDER — GADOBENATE DIMEGLUMINE 529 MG/ML IV SOLN
15.0000 mL | Freq: Once | INTRAVENOUS | Status: AC | PRN
  Administered 2020-01-07: 15 mL via INTRAVENOUS

## 2020-01-07 NOTE — Telephone Encounter (Signed)
Received cd [facility] cd on [employer]

## 2020-01-07 NOTE — Telephone Encounter (Signed)
Clarified with Dr. Felecia Shelling that he would like her to stay at q6 weeks for Tysabri infusion. Relayed this to Liane in infusion suite.   Also received letter in mail from [facility] that pt is enrolled in copay program. Account number: [id number]. CVV #: [id number]. Expiration date: 08/21/2022. Sent copy to MR to be scanned and gave copy to intrafusion.

## 2020-01-11 ENCOUNTER — Telehealth: Payer: Self-pay | Admitting: Neurology

## 2020-01-11 NOTE — Telephone Encounter (Signed)
I reviewed the MRI of the brain from 04/26/2019 an MRI of the cervical spine from 05/07/2019. The MRI of the brain showed multiple T2/flair hyperintense foci consistent with MS.  These are mostly in the periventricular, juxtacortical and deep white matter.  There is also one in the lateral left thalamus and the right inferior pons and possibly right cerebellar hemisphere  The MRI of the cervical spine shows a large left lateral focus adjacent to C5-C6 to C6-C7.  On sagittal views there may also be a second focus adjacent to C4-C5

## 2020-04-01 ENCOUNTER — Encounter: Payer: Self-pay | Admitting: Physician Assistant

## 2020-04-28 ENCOUNTER — Ambulatory Visit: Payer: 59 | Admitting: Physician Assistant

## 2020-04-28 ENCOUNTER — Other Ambulatory Visit: Payer: Self-pay

## 2020-04-28 ENCOUNTER — Encounter: Payer: Self-pay | Admitting: Physician Assistant

## 2020-04-28 VITALS — BP 127/87 | HR 76 | Temp 98.4°F | Wt 184.6 lb

## 2020-04-28 DIAGNOSIS — Z3040 Encounter for surveillance of contraceptives, unspecified: Secondary | ICD-10-CM | POA: Diagnosis not present

## 2020-04-28 DIAGNOSIS — I471 Supraventricular tachycardia, unspecified: Secondary | ICD-10-CM

## 2020-04-28 MED ORDER — NORETHINDRONE 0.35 MG PO TABS: 1.0000 | ORAL_TABLET | Freq: Every day | ORAL | 11 refills | Status: DC

## 2020-04-28 MED ORDER — METOPROLOL TARTRATE 50 MG PO TABS: 50.0000 mg | ORAL_TABLET | Freq: Two times a day (BID) | ORAL | 0 refills | Status: DC

## 2020-04-28 NOTE — Patient Instructions (Signed)
Sinus Tachycardia  Sinus tachycardia is a kind of fast heartbeat. In sinus tachycardia, the heart beats more than 100 times a minute. Sinus tachycardia starts in a part of the heart called the sinus node. Sinus tachycardia may be harmless, or it may be a sign of a serious condition. What are the causes? This condition may be caused by:  Exercise or exertion.  A fever.  Pain.  Loss of body fluids (dehydration).  Severe bleeding (hemorrhage).  Anxiety and stress.  Certain substances, including: ? Alcohol. ? Caffeine. ? Tobacco and nicotine products. ? Cold medicines. ? Illegal drugs.  Medical conditions including: ? Heart disease. ? An infection. ? An overactive thyroid (hyperthyroidism). ? A lack of red blood cells (anemia). What are the signs or symptoms? Symptoms of this condition include:  A feeling that the heart is beating quickly (palpitations).  Suddenly noticing your heartbeat (cardiac awareness).  Dizziness.  Tiredness (fatigue).  Shortness of breath.  Chest pain.  Nausea.  Fainting. How is this diagnosed? This condition is diagnosed with:  A physical exam.  Other tests, such as: ? Blood tests. ? An electrocardiogram (ECG). This test measures the electrical activity of the heart. ? Ambulatory cardiac monitor. This records your heartbeats for 24 hours or more. You may be referred to a heart specialist (cardiologist). How is this treated? Treatment for this condition depends on the cause or the underlying condition. Treatment may involve:  Treating the underlying condition.  Taking new medicines or changing your current medicines as told by your health care provider.  Making changes to your diet or lifestyle. Follow these instructions at home: Lifestyle  Do not use any products that contain nicotine or tobacco, such as cigarettes and e-cigarettes. If you need help quitting, ask your health care provider.  Do not use illegal drugs, such as  cocaine.  Learn relaxation methods to help you when you get stressed or anxious. These include deep breathing.  Avoid caffeine or other stimulants.   Alcohol use  Do not drink alcohol if: ? Your health care provider tells you not to drink. ? You are pregnant, may be pregnant, or are planning to become pregnant.  If you drink alcohol, limit how much you have: ? 0-1 drink a day for women. ? 0-2 drinks a day for men.  Be aware of how much alcohol is in your drink. In the [place] one drink equals one typical bottle of beer (12 oz), one-half glass of wine (5 oz), or one shot of hard liquor (1 oz).   General instructions  Drink enough fluids to keep your urine pale yellow.  Take over-the-counter and prescription medicines only as told by your health care provider.  Keep all follow-up visits as told by your health care provider. This is important. Contact a health care provider if you have:  A fever.  Vomiting or diarrhea that does not go away. Get help right away if you:  Have pain in your chest, upper arms, jaw, or neck.  Become weak or dizzy.  Feel faint.  Have palpitations that do not go away. Summary  In sinus tachycardia, the heart beats more than 100 times a minute.  Sinus tachycardia may be harmless, or it may be a sign of a serious condition.  Treatment for this condition depends on the cause or the underlying condition.  Get help right away if you have pain in your chest, upper arms, jaw, or neck. This information is not intended to replace advice given to   you by your health care provider. Make sure you discuss any questions you have with your health care provider. Document Revised: 03/29/2017 Document Reviewed: 03/29/2017 Elsevier Patient Education  2021 Elsevier Inc.  

## 2020-04-28 NOTE — Progress Notes (Signed)
Established patient visit   Patient: Janet Duran   DOB: 1995-05-18   25 y.o. Female  MRN: [id number] Visit Date: 04/28/2020  Today's healthcare provider: Trinna Post, PA-C   Chief Complaint  Patient presents with  . Elevated Heart Rate  . Follow-up  I,Sircharles Holzheimer M Hartwell Vandiver,acting as a scribe for Trinna Post, PA-C.,have documented all relevant documentation on the behalf of Trinna Post, PA-C,as directed by  Trinna Post, PA-C while in the presence of Trinna Post, PA-C.  Subjective    HPI  Follow up for elevated pulse & blood pressure  The patient was last seen for this 1 weeks ago. She was getting Tysabri infusion and her blood pressure and heart rate were very high, in the 130s. Her blood pressure was 170s/100s. She was advised to go to urgent care and when she got there her heart rate entered the 170s.  Changes made at last visit include she was started on Metoprolol 50 mg BID by a urgent care doctor.   She reports good compliance with treatment. She feels that condition is Improved. She is not having side effects.  She is currently taking an estrogen containing OCP.  -----------------------------------------------------------------------------------------  Pulse Readings from Last 3 Encounters:  04/28/20 76  12/26/19 88  08/30/19 (!) 128        Medications: Outpatient Medications Prior to Visit  Medication Sig  . cholecalciferol (VITAMIN D3) 25 MCG (1000 UNIT) tablet Take 1,000 Units by mouth daily.  Marland Kitchen tolterodine (DETROL LA) 4 MG 24 hr capsule Take 1 capsule (4 mg total) by mouth daily.  . [DISCONTINUED] metoprolol tartrate (LOPRESSOR) 50 MG tablet Take 50 mg by mouth 2 (two) times daily.  . [DISCONTINUED] norethindrone-ethinyl estradiol (JUNEL 1/20) 1-20 MG-MCG tablet Take 1 tablet by mouth daily.  . Vitamin D, Ergocalciferol, (DRISDOL) 1.25 MG (50000 UNIT) CAPS capsule Take 1 capsule (50,000 Units total) by mouth every 7 (seven)  days. (Patient not taking: Reported on 04/28/2020)  . [DISCONTINUED] natalizumab (TYSABRI) 300 MG/15ML injection Inject 300 mg into the vein every 6 (six) weeks. (Patient not taking: Reported on 04/28/2020)  . [DISCONTINUED] nortriptyline (PAMELOR) 25 MG capsule Take 25 mg by mouth at bedtime. (Patient not taking: No sig reported)  . [DISCONTINUED] rizatriptan (MAXALT-MLT) 10 MG disintegrating tablet Take 10 mg by mouth as needed for migraine. May repeat in 2 hours if needed (Patient not taking: Reported on 04/28/2020)  . [DISCONTINUED] vitamin B-12 (CYANOCOBALAMIN) 1000 MCG tablet Take 1,000 mcg by mouth daily. (Patient not taking: Reported on 04/28/2020)   No facility-administered medications prior to visit.    Review of Systems  Respiratory: Negative for chest tightness.   Cardiovascular: Negative for chest pain and palpitations.  Neurological: Positive for dizziness and headaches.       Objective    BP 127/87 (BP Location: Left Arm, Patient Position: Sitting, Cuff Size: Normal)   Pulse 76   Temp 98.4 F (36.9 C) (Oral)   Wt 184 lb 9.6 oz (83.7 kg)   SpO2 99%   BMI 33.76 kg/m     Physical Exam Constitutional:      Appearance: Normal appearance.  Cardiovascular:     Rate and Rhythm: Normal rate and regular rhythm.     Heart sounds: Normal heart sounds.  Pulmonary:     Effort: Pulmonary effort is normal.     Breath sounds: Normal breath sounds.  Skin:    General: Skin is warm and dry.  Neurological:  General: No focal deficit present.     Mental Status: She is alert and oriented to person, place, and time.  Psychiatric:        Mood and Affect: Mood normal.        Behavior: Behavior normal.       No results found for any visits on 04/28/20.  Assessment & Plan    1. Paroxysmal supraventricular tachycardia (HCC)  Will switch OCP to progesterone only pill. Continue metoprolol. Refer to cardiology for further workup.   - metoprolol tartrate (LOPRESSOR) 50 MG tablet;  Take 1 tablet (50 mg total) by mouth 2 (two) times daily.  Dispense: 180 tablet; Refill: 0 - Ambulatory referral to Cardiology  2. Encounter for surveillance of contraceptives, unspecified contraceptive  - norethindrone (CAMILA) 0.35 MG tablet; Take 1 tablet (0.35 mg total) by mouth daily.  Dispense: 28 tablet; Refill: 11    Return if symptoms worsen or fail to improve.      ITrinna Post, PA-C, have reviewed all documentation for this visit. The documentation on 04/29/20 for the exam, diagnosis, procedures, and orders are all accurate and complete.  The entirety of the information documented in the History of Present Illness, Review of Systems and Physical Exam were personally obtained by me. Portions of this information were initially documented by [facility] and reviewed by me for thoroughness and accuracy.     Paulene Floor  [facility] [phone] (phone) [phone] (fax)  [place]

## 2020-04-29 NOTE — Addendum Note (Signed)
Addended by: Trinna Post on: 04/29/2020 09:47 AM   Modules accepted: Level of Service

## 2020-05-10 NOTE — Progress Notes (Signed)
Cardiology Office Note:    Date:  05/13/2020   ID:  Janet Duran, DOB September 17, 1995, MRN [id number]  PCP:  Pollak, Janet M, [place]  Cardiologist:  No primary care provider on file.  Advanced Practice Provider:  No care team member to display Electrophysiologist:  None   Referring MD: Trinna Post, PA-C    History of Present Illness:    Janet Duran is a 25 y.o. female with a hx of MS and depression who was referred by Carles Collet, PA for further evaluation of HTN and sinus tachycardia.  The patient states that she went in for her 6 week tysabri infusion for her MS. When she arrived, her blood pressure was high and her HR was 130. Received hydralazine IV and her HR went up to 160, but blood pressure went down to 120/80s. Got an additional dose of IV hydralazine and HR went up to 180s. She developed a headache, nausea and palpitations at that time. She was told to go to the ER after she left for further evaluation. Shortly after she left, she started vomiting which continued for about 1.5 hours.  She was seen at urgent care where she reportedly was told she had SVT (no strips available). Her symptoms gradually improved and she was started on  metoprolol 50mg  BID.    Patient states that she always gets nervous when she goes to the doctor and her blood pressure frequently runs high. On that day, it was way higher than normal (she thinks SBP was around 200). Has not been formally diagnosed with HTN but has been told it has been high on multiple occasions. She was notably on a combo OCP which was just changed to progesterone only OCP per her primary care. Family history is unknown as she is adopted. Otherwise, no chest pain with exertion, SOB, DOE, lightheadedness, dizziness. Able to walk her dog a mile without issues. HR now in 70s while on the beta blocker.   Past Medical History:  Diagnosis Date  . Anxiety   . Depression   . Ectopic  beat   . Ectopic heartbeat   . Migraine   . Multiple sclerosis ([place])     Past Surgical History:  Procedure Laterality Date  . NO PAST SURGERIES      Current Medications: Current Meds  Medication Sig  . metoprolol tartrate (LOPRESSOR) 50 MG tablet Take 1 tablet (50 mg total) by mouth 2 (two) times daily.  . norethindrone (CAMILA) 0.35 MG tablet Take 1 tablet (0.35 mg total) by mouth daily.  . rizatriptan (MAXALT) 10 MG tablet PLEASE SEE ATTACHED FOR DETAILED DIRECTIONS  . tolterodine (DETROL LA) 4 MG 24 hr capsule Take 1 capsule (4 mg total) by mouth daily.  . Vitamin D, Ergocalciferol, (DRISDOL) 1.25 MG (50000 UNIT) CAPS capsule Take 50,000 Units by mouth every 7 (seven) days.     Allergies:   Patient has no known allergies.   Social History   Socioeconomic History  . Marital status: Single    Spouse name: Not on file  . Number of children: 0  . Years of education: Bachelors   . Highest education level: Not on file  Occupational History  . 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 Smoking status: Never Smoker  . Smokeless tobacco: Never Used  Substance and Sexual Activity  . Alcohol use: Yes    Comment: rare  . Drug use: No  . Sexual activity:  Not on file  Other Topics Concern  . Not on file  Social History Narrative   Right handed    Lives with boyfriend   Caffeine use: Soda daily ( 1 per day)   Social Determinants of Health   Financial Resource Strain: Not on file  Food Insecurity: Not on file  Transportation Needs: Not on file  Physical Activity: Not on file  Stress: Not on file  Social Connections: Not on file     Family History: The patient's family history is not on file. She was adopted.  ROS:   Please see the history of present illness.    Review of Systems  Constitutional: Negative for chills and fever.  HENT: Negative for hearing loss.   Eyes: Negative for blurred vision and redness.  Respiratory: Negative for shortness of breath.    Cardiovascular: Positive for palpitations. Negative for chest pain, orthopnea, claudication, leg swelling and PND.  Gastrointestinal: Positive for nausea and vomiting. Negative for melena.  Genitourinary: Negative for dysuria and flank pain.  Musculoskeletal: Negative for falls and myalgias.  Skin: Negative for itching and rash.  Neurological: Negative for dizziness and loss of consciousness.  Endo/Heme/Allergies: Negative for polydipsia.  Psychiatric/Behavioral: Negative for substance abuse.    EKGs/Labs/Other Studies Reviewed:    The following studies were reviewed today: No cardiac studies  EKG:  EKG is  ordered today.  The ekg ordered today demonstrates NSR with HR 82  Recent Labs: No results found for requested labs within last 8760 hours.  Recent Lipid Panel    Component Value Date/Time   CHOL 227 (H) 02/01/2018 0959   TRIG 204 (H) 02/01/2018 0959   HDL 50 02/01/2018 0959   CHOLHDL 4.5 (H) 02/01/2018 0959   LDLCALC 136 (H) 02/01/2018 0959     Physical Exam:    VS:  BP 124/90   Pulse 82   Ht 5\' 2"  (1.575 m)   Wt 185 lb 12.8 oz (84.3 kg)   SpO2 99%   BMI 33.98 kg/m     Wt Readings from Last 3 Encounters:  05/13/20 185 lb 12.8 oz (84.3 kg)  04/28/20 184 lb 9.6 oz (83.7 kg)  12/26/19 186 lb (84.4 kg)     GEN:  Well nourished, well developed in no acute distress HEENT: Normal NECK: No JVD; No carotid bruits CARDIAC: RRR, no murmurs, rubs, gallops RESPIRATORY:  Clear to auscultation without rales, wheezing or rhonchi  ABDOMEN: Soft, non-tender, non-distended MUSCULOSKELETAL:  No edema; No deformity  SKIN: Warm and dry NEUROLOGIC:  Alert and oriented x 3 PSYCHIATRIC:  Normal affect   ASSESSMENT:    1. Hypertensive urgency   2. Palpitations    PLAN:    In order of problems listed above:  #Hypertensive Urgency: #Palpitations: #?SVT: Patient with episode of significant HTN prior to receiving her tysabri infusion with associated tachycardia with HR  reportedly 130s (strips not available). Was given hydralazine x2 IV doses with improvement of BP to 120s per report but HR jumped up as high as 180s. Was told she may have SVT, however, strips not available to review. She was symptomatic at that time with headache, nausea (with eventual vomiting), and palpitaitons. Went to urgent care where she was started on metop 50mg  BID. Doing well now with no recurrence of symptoms. HR running 70s at home. Blood pressure well controlled. Has been changed to progesterone only OCP. -Continue metop 50mg  BID -Obtain records from urgent care to clarify rhythm as hydralazine can cause significant reflex tachycardia  especially if patient's blood pressure went from 200 to 120 with the injection -Keep blood pressure and HR log for the next 2 weeks and follow-up with HTN clinic -No palpitations currently or since that episode; if recur, will place a monitor -Patient may need to take blood pressure medication on the days of her infusion to prevent from occurring in the future but may not merit life-long BB; will follow-up blood pressure and HR log -If true HTN, may need to assess for secondary causes given young onset (family history unknown due to adoption) -Agree with progesterone-only OCP as estrogen containing OCP may worsen HTN    Medication Adjustments/Labs and Tests Ordered: Current medicines are reviewed at length with the patient today.  Concerns regarding medicines are outlined above.  Orders Placed This Encounter  Procedures  . EKG 12-Lead   No orders of the defined types were placed in this encounter.   Patient Instructions  Medication Instructions:  Your physician recommends that you continue on your current medications as directed. Please refer to the Current Medication list given to you today.  *If you need a refill on your cardiac medications before your next appointment, please call your pharmacy*   Lab Work: None If you have labs (blood work)  drawn today and your tests are completely normal, you will receive your results only by: Marland Kitchen MyChart Message (if you have MyChart) OR . A paper copy in the mail If you have any lab test that is abnormal or we need to change your treatment, we will call you to review the results.   Testing/Procedures: None   Follow-Up:  Your physician recommends that you schedule a follow-up appointment in: 2 weeks with the Hypertension clinic.  At [facility], you and your health needs are our priority.  As part of our continuing mission to provide you with exceptional heart care, we have created designated Provider Care Teams.  These Care Teams include your primary Cardiologist (physician) and Advanced Practice Providers (APPs -  Physician Assistants and Nurse Practitioners) who all work together to provide you with the care you need, when you need it.  We recommend signing up for the patient portal called "MyChart".  Sign up information is provided on this After Visit Summary.  MyChart is used to connect with patients for Virtual Visits (Telemedicine).  Patients are able to view lab/test results, encounter notes, upcoming appointments, etc.  Non-urgent messages can be sent to your provider as well.   To learn more about what you can do with MyChart, go to [email or web address].    Your next appointment:   2 month(s)  The format for your next appointment:   In Person  Provider:   You may see Dr. Gwyndolyn Kaufman or one of the following Advanced Practice Providers on your designated Care Team:    Richardson Dopp, PA-C  Robbie Lis, [place]    Other Instructions      Signed, Freada Bergeron, MD  05/13/2020 11:12 AM    [place]

## 2020-05-13 ENCOUNTER — Encounter: Payer: Self-pay | Admitting: Cardiology

## 2020-05-13 ENCOUNTER — Ambulatory Visit: Payer: 59 | Admitting: Cardiology

## 2020-05-13 ENCOUNTER — Other Ambulatory Visit: Payer: Self-pay

## 2020-05-13 VITALS — BP 124/90 | HR 82 | Ht 62.0 in | Wt 185.8 lb

## 2020-05-13 DIAGNOSIS — R002 Palpitations: Secondary | ICD-10-CM | POA: Diagnosis not present

## 2020-05-13 DIAGNOSIS — I160 Hypertensive urgency: Secondary | ICD-10-CM | POA: Diagnosis not present

## 2020-05-13 NOTE — Patient Instructions (Signed)
Medication Instructions:  Your physician recommends that you continue on your current medications as directed. Please refer to the Current Medication list given to you today.  *If you need a refill on your cardiac medications before your next appointment, please call your pharmacy*   Lab Work: None If you have labs (blood work) drawn today and your tests are completely normal, you will receive your results only by: Marland Kitchen MyChart Message (if you have MyChart) OR . A paper copy in the mail If you have any lab test that is abnormal or we need to change your treatment, we will call you to review the results.   Testing/Procedures: None   Follow-Up:  Your physician recommends that you schedule a follow-up appointment in: 2 weeks with the Hypertension clinic.  At [facility], you and your health needs are our priority.  As part of our continuing mission to provide you with exceptional heart care, we have created designated Provider Care Teams.  These Care Teams include your primary Cardiologist (physician) and Advanced Practice Providers (APPs -  Physician Assistants and Nurse Practitioners) who all work together to provide you with the care you need, when you need it.  We recommend signing up for the patient portal called "MyChart".  Sign up information is provided on this After Visit Summary.  MyChart is used to connect with patients for Virtual Visits (Telemedicine).  Patients are able to view lab/test results, encounter notes, upcoming appointments, etc.  Non-urgent messages can be sent to your provider as well.   To learn more about what you can do with MyChart, go to [email or web address].    Your next appointment:   2 month(s)  The format for your next appointment:   In Person  Provider:   You may see Dr. Gwyndolyn Kaufman or one of the following Advanced Practice Providers on your designated Care Team:    Richardson Dopp, PA-C  Robbie Lis, [place]    Other Instructions

## 2020-06-03 ENCOUNTER — Other Ambulatory Visit: Payer: Self-pay

## 2020-06-03 ENCOUNTER — Ambulatory Visit (INDEPENDENT_AMBULATORY_CARE_PROVIDER_SITE_OTHER): Payer: 59 | Admitting: Pharmacist

## 2020-06-03 VITALS — BP 142/108 | HR 88

## 2020-06-03 DIAGNOSIS — I10 Essential (primary) hypertension: Secondary | ICD-10-CM | POA: Diagnosis not present

## 2020-06-03 MED ORDER — VALSARTAN 40 MG PO TABS: 40.0000 mg | ORAL_TABLET | Freq: Every day | ORAL | 1 refills | Status: DC

## 2020-06-03 NOTE — Progress Notes (Signed)
Patient ID: OLA RAAP                 DOB: 12-11-1995                      MRN: [id number]     HPI: Janet Duran is a 25 y.o. female referred by Dr. Johney Frame to HTN clinic. PMH is significant for multiple sclerosis (followed by neuro), migraine, vitamin D deficiency, and headaches.  Patient currently receives Tysabri injections every 6 weeks.  At last injection, patients BP and pulse rate were elevated.  Hydralazine was administered which lowered BP, however HR increased to 180s. Patient was sent to ED where she began vomiting. She went to urgent care where she told she had SVT and was started on metoprolol tartrate 50mg  BID.  PCP referred patient to cardiology and switched OCP to progesterone only tablet.  At first visit with Dr Johney Frame, patient was instructed to keep BP and HR log however not palpitations were noted.  Patient reports she may have white coat hypertension.  Patient presents today in good spirits.  Tolerating metoprolol and has been taking her BP and pulse rate:  4/13: 143/104, 119 4/10: 137/93, 102 3/31: 126/86, 85 3/30: 132/94, 116 3/29: 141/101, 86 3/27: 131/84, 101 3/24: 120/88, 100 3/23: 132/91, 88  Of note, patient has been out of metoprolol for about 2-3 days because she has not picked up from pharmacy yet but she reports it is ready.  Patient reports she has a high stress job.  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    Recently came back from a trip to [facility] where she did a lot of walking. However, she reports she is not physically active at home and her diet is poor and eats a lot of fast food.  Is in almost a constant state of pain due to her MS.  Reports it is mostly in her back.  Is adopted, but is in contact with her biologic family. She does state however that they are not very healthy.  Is aware of precautions with progesterone only oral  contraceptives such as taking at the same time every day and using backup contraception if dose is late or missed.  No current lab work on file.  Current HTN meds: metoprolol 50mg  BID Previously tried: n/a BP goal: <130/80  Social History: no tobacco, no alcohol  Exercise: No very physically active  Wt Readings from Last 3 Encounters:  05/13/20 185 lb 12.8 oz (84.3 kg)  04/28/20 184 lb 9.6 oz (83.7 kg)  12/26/19 186 lb (84.4 kg)   BP Readings from Last 3 Encounters:  05/13/20 124/90  04/28/20 127/87  12/26/19 (!) 137/96   Pulse Readings from Last 3 Encounters:  05/13/20 82  04/28/20 76  12/26/19 88    Renal function: CrCl cannot be calculated (Patient's most recent lab result is older than the maximum 21 days allowed.).  Past Medical History:  Diagnosis Date  . Anxiety   . Depression   . Ectopic beat   . Ectopic heartbeat   . Migraine   . Multiple sclerosis ([place])     Current Outpatient Medications on File Prior to Visit  Medication Sig Dispense Refill  . metoprolol tartrate (LOPRESSOR) 50 MG tablet Take 1 tablet (50 mg total) by mouth 2 (two) times daily. 180 tablet 0  . norethindrone (  CAMILA) 0.35 MG tablet Take 1 tablet (0.35 mg total) by mouth daily. 28 tablet 11  . rizatriptan (MAXALT) 10 MG tablet PLEASE SEE ATTACHED FOR DETAILED DIRECTIONS    . tolterodine (DETROL LA) 4 MG 24 hr capsule Take 1 capsule (4 mg total) by mouth daily. 90 capsule 3  . Vitamin D, Ergocalciferol, (DRISDOL) 1.25 MG (50000 UNIT) CAPS capsule Take 50,000 Units by mouth every 7 (seven) days.     No current facility-administered medications on file prior to visit.    No Known Allergies   Assessment/Plan:  1. Hypertension - Patient's BP in room today 142/108 which is similar to her home readings and above goal of <130/80.  Patient has not taken her metoprolol today. Patient's blood pressure also possibly influenced by anxiety, stress, inactivity, poor diet, and MS pain.  Discussed  importance of eating healthy and trying to limit the amount of sodium in diet. Explained how processed and fat foods have a lot of sodium which will increase blood pressure.  Also recommended she increase physical activity up to 30 minutes a day at least 5 days a week.   May need referral to psych or have PCP monitor stress and anxiety as it relates to her job.    Needs further pharmacologic therapy to help bring down blood pressure.  Will start with low dose ARB and check CMP in 1 week since no current lab work is on file.  Will likely have to titrate up.  Patient reports is not pregnant.  Has follow up with Dr Johney Frame in 1 months and will see again in HTN clinic in 2 months.  Continue metoprolol tartrate 50mg  BID Start valsartan 40mg  once daily Check CMP in 1 week Recheck in 2 months  Karren Cobble, PharmD, BCACP, CDCES, [place] [address] [place], [place] [address] Phone: [phone]; Fax: [phone] 06/03/2020 5:31 PM

## 2020-06-03 NOTE — Patient Instructions (Addendum)
It was nice meeting you today!  We would like your blood pressure to be less than 130/80  Please continue your metoprolol 50mg  twice daily  Try to limit your salt and sugar intake and eat a heart healthy diet  Try to increase physical activity up to 30 minutes a day at least 5 days a week  We will start a new medication called valsartan 40mg  which you will take once a day  Continue to monitor your blood pressure at home  Call with any questions!  Karren Cobble, PharmD, BCACP, [place], [place] [address], [place], [place] [address] Phone: [phone]; Fax: [phone] 06/03/2020 11:05 AM

## 2020-06-10 ENCOUNTER — Other Ambulatory Visit: Payer: 59

## 2020-06-10 ENCOUNTER — Other Ambulatory Visit: Payer: Self-pay

## 2020-06-10 DIAGNOSIS — I10 Essential (primary) hypertension: Secondary | ICD-10-CM

## 2020-06-10 LAB — COMPREHENSIVE METABOLIC PANEL
ALT: 113 IU/L — ABNORMAL HIGH (ref 0–32)
AST: 85 IU/L — ABNORMAL HIGH (ref 0–40)
Albumin/Globulin Ratio: 1.8 (ref 1.2–2.2)
Albumin: 4.8 g/dL (ref 3.9–5.0)
Alkaline Phosphatase: 82 IU/L (ref 44–121)
BUN/Creatinine Ratio: 14 (ref 9–23)
BUN: 8 mg/dL (ref 6–20)
Bilirubin Total: 0.4 mg/dL (ref 0.0–1.2)
CO2: 21 mmol/L (ref 20–29)
Calcium: 10.1 mg/dL (ref 8.7–10.2)
Chloride: 100 mmol/L (ref 96–106)
Creatinine, Ser: 0.57 mg/dL (ref 0.57–1.00)
Globulin, Total: 2.6 g/dL (ref 1.5–4.5)
Glucose: 163 mg/dL — ABNORMAL HIGH (ref 65–99)
Potassium: 4.4 mmol/L (ref 3.5–5.2)
Sodium: 138 mmol/L (ref 134–144)
Total Protein: 7.4 g/dL (ref 6.0–8.5)
eGFR: 130 mL/min/{1.73_m2} (ref >59)

## 2020-06-11 ENCOUNTER — Telehealth: Payer: Self-pay | Admitting: Pharmacist

## 2020-06-11 NOTE — Telephone Encounter (Signed)
Left message on machine regarding lab results

## 2020-06-12 ENCOUNTER — Telehealth: Payer: Self-pay | Admitting: Pharmacist

## 2020-06-12 NOTE — Telephone Encounter (Signed)
Patient called back to discuss lab results.  Advised that renal function and electrolytes normal since starting valsartan.  Asked her how her home BP readings looked but she was not at home but thinks they are still above goal.  Advised that I forwarded results to neurology since elevated LFTs may be from Tysabri infusions and she should expect a call.  Patient voiced understanding.

## 2020-06-12 NOTE — Telephone Encounter (Signed)
Patient called back and left voicemail.  Called patient back, no answer.  LMOM

## 2020-06-24 ENCOUNTER — Telehealth: Payer: Self-pay | Admitting: *Deleted

## 2020-06-24 ENCOUNTER — Other Ambulatory Visit: Payer: Self-pay | Admitting: Neurology

## 2020-06-24 ENCOUNTER — Ambulatory Visit: Payer: 59 | Admitting: Neurology

## 2020-06-24 ENCOUNTER — Encounter: Payer: Self-pay | Admitting: Neurology

## 2020-06-24 VITALS — BP 118/78 | HR 100 | Ht 62.0 in | Wt 186.5 lb

## 2020-06-24 DIAGNOSIS — G43109 Migraine with aura, not intractable, without status migrainosus: Secondary | ICD-10-CM

## 2020-06-24 DIAGNOSIS — R748 Abnormal levels of other serum enzymes: Secondary | ICD-10-CM

## 2020-06-24 DIAGNOSIS — Z79899 Other long term (current) drug therapy: Secondary | ICD-10-CM | POA: Diagnosis not present

## 2020-06-24 DIAGNOSIS — G35 Multiple sclerosis: Secondary | ICD-10-CM | POA: Diagnosis not present

## 2020-06-24 DIAGNOSIS — R7401 Elevation of levels of liver transaminase levels: Secondary | ICD-10-CM | POA: Insufficient documentation

## 2020-06-24 DIAGNOSIS — R3915 Urgency of urination: Secondary | ICD-10-CM

## 2020-06-24 NOTE — Progress Notes (Signed)
[facility]  PATIENT: Janet Duran DOB: 12/27/1995  REFERRING DOCTOR OR PCP: Gurney Maxin, MD SOURCE: Patient, notes from Dr. Melrose Nakayama, imaging and lab reports, MRI images personally reviewed.  _________________________________   HISTORICAL  CHIEF COMPLAINT:  Chief Complaint  Patient presents with  . Follow-up    RM 12,alone. Last seen 12/26/2019. On Tysabri for MS. Last infusion: 06/03/20, next: 07/14/20. Receives q6wks. Last JCV 12/26/19 negative, index: 0.14. No new sx. Tolerating infusions well.    HISTORY OF PRESENT ILLNESS:  Janet Duran is a 25 y.o. woman with relapsing remitting multiple sclerosis.  Update 06/25/2018 She is on Tysabri and tolerates it well.  No exacerbations.   She had elevated LFTs 2 weeks ago.    We will recheck and check other labs.     Gait is unchanged with mild stumbling at times.   She denies numbness or weakness in her arms or legs.   She notes urinary urgency improved with Detrol LA     She gets a popping sensation in her neck when she bends it but does not have a classic Lhermitte sign.    She has fatigue, physical > cognitive    Fatigue is much greater than sleepiness.  She sleeps well most nights -- 8 hours on average..    She notes depression and anxiety, not helped by a couple different med's    She notes mild brain fog and has had trouble coming up with the right word at times.    She works at [employer].     She is taking vitamin D supplements 50000 weekly but recently stopped.  Vitamin D was deficient at 15.3.Marland Kitchen  She had three B12 injectons for deficiency (184) and was taking pills   She has some migraine headache.   These have not bothered her too much.  She occasionally takes rizatriptan   MS history: She was diagnosed with MS in March 2021 after presenting with fuzzy vision in both eyes and numbness in her right hand.   She had a migraine at the time.   Symptoms improved after 15 minutes.    She saw her PCP for suspected complicated migraine and was referred to Dr. Melrose Nakayama.   She had a brain MRI which was consistent with MS.   She also had a cervical spine MRI.   To confirm the diagnosis, she underwent an LP.  It showed oligoclonal bands confirming the diagnosis of MS.    She was started on Tysabri.   She is on Tysabri 300 mg every 6 weeks and tolerates it well.    She has not had not had any exacerbations.   Her next infusion is scheduled for 01/24/2020.   Her last JCV antibody was 0.10 (negative)  08/07/2019.  IMAGING MRI of the brain 04/27/2019 showed multiple T2/FLAIR hyperintense foci in the hemispheres, many in the periventricular white matter radially oriented to the ventricles.  The foci was also present in the lateral left thalamus, right pons and right cerebellum.  None of the foci enhanced.  MRI of the cervical spine showed abnormal T2 hyperintense foci to the left adjacent to C6 and centrally adjacent to C4-C5.  The C6 focus enhanced after contrast.  MRI 01/07/2020 showed T2/FLAIR hyperintense foci in the hemispheres, left thalamus and right cerebellar hemisphere in a pattern and configuration consistent with chronic demyelinating plaque associated with multiple sclerosis.  None of the foci appear to be acute.  They do not enhance.  There is a normal enhancement pattern.  LABS Lab work from 05/02/2019 was reviewed.  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  Family History: She is adopted.      REVIEW OF SYSTEMS: Constitutional: No fevers, chills, sweats, or change in appetite Eyes: No visual changes, double vision, eye pain Ear, nose and throat: No  hearing loss, ear pain, nasal congestion, sore throat Cardiovascular: No chest pain, palpitations Respiratory: No shortness of breath at rest or with exertion.   No wheezes GastrointestinaI: No nausea, vomiting, diarrhea, abdominal pain, fecal incontinence Genitourinary: No dysuria, urinary retention or frequency.  No nocturia. Musculoskeletal: No neck pain, back pain Integumentary: No rash, pruritus, skin lesions Neurological: as above Psychiatric: No depression at this time.  No anxiety Endocrine: No palpitations, diaphoresis, change in appetite, change in weigh or increased thirst Hematologic/Lymphatic: No anemia, purpura, petechiae. Allergic/Immunologic: No itchy/runny eyes, nasal congestion, recent allergic reactions, rashes  ALLERGIES: No Known Allergies  HOME MEDICATIONS:  Current Outpatient Medications:  .  metoprolol tartrate (LOPRESSOR) 50 MG tablet, Take 1 tablet (50 mg total) by mouth 2 (two) times daily., Disp: 180 tablet, Rfl: 0 .  natalizumab (TYSABRI) 300 MG/15ML injection, Inject 300 mg into the vein every 6 (six) weeks., Disp: , Rfl:  .  norethindrone (CAMILA) 0.35 MG tablet, Take 1 tablet (0.35 mg total) by mouth daily., Disp: 28 tablet, Rfl: 11 .  rizatriptan (MAXALT) 10 MG tablet, PLEASE SEE ATTACHED FOR DETAILED DIRECTIONS, Disp: , Rfl:  .  tolterodine (DETROL LA) 4 MG 24 hr capsule, Take 1 capsule (4 mg total) by mouth daily., Disp: 90 capsule, Rfl: 3 .  valsartan (DIOVAN) 40 MG tablet, Take 1 tablet (40 mg total) by mouth daily., Disp: 30 tablet, Rfl: 1 .  Vitamin D, Ergocalciferol, (DRISDOL) 1.25 MG (50000 UNIT) CAPS capsule, Take 50,000 Units by mouth every 7 (seven) days., Disp: , Rfl:   PAST MEDICAL HISTORY: Past Medical History:  Diagnosis Date  . Anxiety   . Depression   . Ectopic beat   . Ectopic heartbeat   . Migraine   . Multiple sclerosis ([place])     PAST SURGICAL HISTORY: Past Surgical History:  Procedure Laterality Date  . NO PAST  SURGERIES      FAMILY HISTORY: Family History  Adopted: Yes    SOCIAL HISTORY:  Social History   Socioeconomic History  . Marital status: Single    Spouse name: Not on file  . Number of children: 0  . Years of education: Bachelors   . Highest education level: Not on file  Occupational History  . 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 Smoking status: Never Smoker  . Smokeless tobacco: Never Used  Substance and Sexual Activity  . Alcohol use: Yes    Comment: rare  . Drug use: No  . Sexual activity: Not on file  Other Topics Concern  . Not on file  Social History Narrative   Right handed    Lives with boyfriend   Caffeine use: Soda daily ( 1 per day)   Social Determinants of Health   Financial Resource Strain: Not on file  Food Insecurity: Not on file  Transportation Needs: Not on file  Physical Activity: Not  on file  Stress: Not on file  Social Connections: Not on file  Intimate Partner Violence: Not on file     PHYSICAL EXAM  Vitals:   06/24/20 1258  BP: 118/78  Pulse: 100  SpO2: 98%  Weight: 186 lb 8 oz (84.6 kg)  Height: 5\' 2"  (1.575 m)    Body mass index is 34.11 kg/m.  No exam data present  General: The patient is well-developed and well-nourished and in no acute distress  HEENT:  Head is [place]/AT.  Sclera are anicteric.  Funduscopic exam shows normal optic discs and retinal vessels.  Neck: No carotid bruits are noted.  The neck is nontender.  Cardiovascular: The heart has a regular rate and rhythm with a normal S1 and S2. There were no murmurs, gallops or rubs.    Skin: Extremities are without rash or  edema.  Musculoskeletal:  Back is nontender  Neurologic Exam  Mental status: The patient is alert and oriented x 3 at the time of the examination. The patient has apparent normal recent and remote memory, with an apparently normal attention span and concentration ability.   Speech is normal.  Cranial nerves: Extraocular movements  are full. Pupils are equal, round, and reactive to light and accomodation.  Visual fields are full.  Facial symmetry is present. There is good facial sensation to soft touch bilaterally.Facial strength is normal.  Trapezius and sternocleidomastoid strength is normal. No dysarthria is noted.  The tongue is midline, and the patient has symmetric elevation of the soft palate. No obvious hearing deficits are noted.  Motor:  Muscle bulk is normal.   Tone is normal. Strength is  5 / 5 in all 4 extremities.   Sensory: Sensory testing is intact to pinprick, soft touch and vibration sensation in all 4 extremities.  Coordination: Cerebellar testing reveals good finger-nose-finger and heel-to-shin bilaterally.  Gait and station: Station is normal.   Gait is normal. Tandem gait is mildly wide.   Romberg is negative.   Reflexes: Deep tendon reflexes are symmetric and normal bilaterally.   Plantar responses are flexor.    DIAGNOSTIC DATA (LABS, IMAGING, TESTING) - I reviewed patient records, labs, notes, testing and imaging myself where available.  Lab Results  Component Value Date   WBC 11.2 (H) 05/14/2019   HGB 15.2 (H) 05/14/2019   HCT 43.8 05/14/2019   MCV 87.8 05/14/2019   PLT 343 05/14/2019      Component Value Date/Time   NA 138 06/10/2020 1210   K 4.4 06/10/2020 1210   CL 100 06/10/2020 1210   CO2 21 06/10/2020 1210   GLUCOSE 163 (H) 06/10/2020 1210   BUN 8 06/10/2020 1210   CREATININE 0.57 06/10/2020 1210   CALCIUM 10.1 06/10/2020 1210   PROT 7.4 06/10/2020 1210   ALBUMIN 4.8 06/10/2020 1210   AST 85 (H) 06/10/2020 1210   ALT 113 (H) 06/10/2020 1210   ALKPHOS 82 06/10/2020 1210   BILITOT 0.4 06/10/2020 1210   GFRNONAA 128 02/01/2019 1543   GFRAA 147 02/01/2019 1543   Lab Results  Component Value Date   CHOL 227 (H) 02/01/2018   HDL 50 02/01/2018   LDLCALC 136 (H) 02/01/2018   TRIG 204 (H) 02/01/2018   CHOLHDL 4.5 (H) 02/01/2018   No results found for: HGBA1C No results  found for: VITAMINB12 Lab Results  Component Value Date   TSH 2.280 02/01/2019       ASSESSMENT AND PLAN  Multiple sclerosis ([place]) - Plan: Stratify JCV Antibody Test (  [facility]), CBC with Differential/Platelet, Hepatic function panel  High risk medication use  Elevated liver enzymes - Plan: Hepatic function panel, Hepatitis C antibody, Hepatitis B core antibody, total, Hepatitis B surface antibody,qualitative, Hepatitis B surface antigen  Migraine with aura and without status migrainosus, not intractable  Urinary urgency    1.   Continue Tysabri every 6 weeks.  Check MRI brain and cervical spine around time of next visit.   2.   Check LFT and Hepatitis labs due to elevated enzymes.   If worsening or based on results, consider referral.  3.   Stay active 4.   I'll hold off on Provigil/Nuvigil as they may interact with OCP.   5.   rtc 6 months  Thelma Viana A. Felecia Shelling, [place], [facility] 0/08/3708, 6:26 PM Certified in Neurology, Clinical Neurophysiology, Sleep Medicine and Neuroimaging  [facility] [address] [place],  [address] [phone]

## 2020-06-24 NOTE — Telephone Encounter (Signed)
Placed JCV lab in [facility] lock box for routine lab pick up. Results pending. 

## 2020-06-25 ENCOUNTER — Other Ambulatory Visit: Payer: Self-pay | Admitting: Neurology

## 2020-06-25 DIAGNOSIS — G35 Multiple sclerosis: Secondary | ICD-10-CM

## 2020-06-25 DIAGNOSIS — Z79899 Other long term (current) drug therapy: Secondary | ICD-10-CM

## 2020-06-25 DIAGNOSIS — R748 Abnormal levels of other serum enzymes: Secondary | ICD-10-CM

## 2020-06-25 LAB — CBC WITH DIFFERENTIAL/PLATELET
Basophils Absolute: 0.1 10*3/uL (ref 0.0–0.2)
Basos: 1 %
EOS (ABSOLUTE): 0.6 10*3/uL — ABNORMAL HIGH (ref 0.0–0.4)
Eos: 4 %
Hematocrit: 42.2 % (ref 34.0–46.6)
Hemoglobin: 14.6 g/dL (ref 11.1–15.9)
Immature Grans (Abs): 0.1 10*3/uL (ref 0.0–0.1)
Immature Granulocytes: 1 %
Lymphocytes Absolute: 8.2 10*3/uL — ABNORMAL HIGH (ref 0.7–3.1)
Lymphs: 51 %
MCH: 29.7 pg (ref 26.6–33.0)
MCHC: 34.6 g/dL (ref 31.5–35.7)
MCV: 86 fL (ref 79–97)
Monocytes Absolute: 1.6 10*3/uL — ABNORMAL HIGH (ref 0.1–0.9)
Monocytes: 10 %
NRBC: 1 % — ABNORMAL HIGH (ref 0–0)
Neutrophils Absolute: 5.3 10*3/uL (ref 1.4–7.0)
Neutrophils: 33 %
Platelets: 341 10*3/uL (ref 150–450)
RBC: 4.91 x10E6/uL (ref 3.77–5.28)
RDW: 13.6 % (ref 11.7–15.4)
WBC: 16 10*3/uL — ABNORMAL HIGH (ref 3.4–10.8)

## 2020-06-25 LAB — HEPATIC FUNCTION PANEL
ALT: 91 IU/L — ABNORMAL HIGH (ref 0–32)
AST: 50 IU/L — ABNORMAL HIGH (ref 0–40)
Albumin: 5 g/dL (ref 3.9–5.0)
Alkaline Phosphatase: 86 IU/L (ref 44–121)
Bilirubin Total: 0.4 mg/dL (ref 0.0–1.2)
Bilirubin, Direct: 0.12 mg/dL (ref 0.00–0.40)
Total Protein: 7.3 g/dL (ref 6.0–8.5)

## 2020-06-25 LAB — HEPATITIS B SURFACE ANTIBODY,QUALITATIVE: Hep B Surface Ab, Qual: NONREACTIVE

## 2020-06-25 LAB — HEPATITIS C ANTIBODY: Hep C Virus Ab: <0.1 s/co ratio (ref 0.0–0.9)

## 2020-06-25 LAB — HEPATITIS B SURFACE ANTIGEN: Hepatitis B Surface Ag: NEGATIVE

## 2020-06-25 LAB — HEPATITIS B CORE ANTIBODY, TOTAL: Hep B Core Total Ab: NEGATIVE

## 2020-07-01 NOTE — Telephone Encounter (Signed)
JCV ab drawn on 06/24/20 negative, index: 0.13.

## 2020-07-13 ENCOUNTER — Other Ambulatory Visit: Payer: Self-pay

## 2020-07-13 DIAGNOSIS — I10 Essential (primary) hypertension: Secondary | ICD-10-CM

## 2020-07-13 NOTE — Progress Notes (Deleted)
Cardiology Office Note:    Date:  07/13/2020   ID:  Janet Duran, DOB May 16, 1995, MRN [id number]  PCP:  Pollak, Adriana M, [place]  Cardiologist:  None  Advanced Practice Provider:  No care team member to display Electrophysiologist:  None   Referring MD: Trinna Post, PA-C    History of Present Illness:    Janet Duran is a 25 y.o. female with a hx of MS and depression who was presents to clinic for follow-up of HTN.  Patient was initially seen on 05/13/20 after she had an episode of severe hypertension and tachycardia when receiving her tysabri infusion for her MS. Specifically, when she arrived for her infusion, her blood pressure was high and her HR was 130. Received hydralazine IV and her HR went up to 160, but blood pressure went down to 120/80s. Got an additional dose of IV hydralazine and HR went up to 180s. She developed a headache, nausea and palpitations at that time. She was told to go to the ER after she left for further evaluation. Shortly after she left, she started vomiting which continued for about 1.5 hours.  She was seen at urgent care where she reportedly was told she had SVT (no strips available). Her symptoms gradually improved and she was started on  metoprolol 50mg  BID.    She was notably on a combo OCP which was just changed to progesterone only OCP per her primary care. We had her continue the metop and follow with HTN clinic for further monitoring. She was subsequently stared on valsartan 40mg  daily.  Today,  Past Medical History:  Diagnosis Date  . Anxiety   . Depression   . Ectopic beat   . Ectopic heartbeat   . Migraine   . Multiple sclerosis ([place])     Past Surgical History:  Procedure Laterality Date  . NO PAST SURGERIES      Current Medications: No outpatient medications have been marked as taking for the 07/15/20 encounter (Appointment) with Freada Bergeron, MD.     Allergies:    Patient has no known allergies.   Social History   Socioeconomic History  . Marital status: Single    Spouse name: Not on file  . Number of children: 0  . Years of education: Bachelors   . Highest education level: Not on file  Occupational History  . 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 Smoking status: Never Smoker  . Smokeless tobacco: Never Used  Substance and Sexual Activity  . Alcohol use: Yes    Comment: rare  . Drug use: No  . Sexual activity: Not on file  Other Topics Concern  . Not on file  Social History Narrative   Right handed    Lives with boyfriend   Caffeine use: Soda daily ( 1 per day)   Social Determinants of Health   Financial Resource Strain: Not on file  Food Insecurity: Not on file  Transportation Needs: Not on file  Physical Activity: Not on file  Stress: Not on file  Social Connections: Not on file     Family History: The patient's family history is not on file. She was adopted.  ROS:   Please see the history of present illness.    Review of Systems  Constitutional: Negative for chills and fever.  HENT: Negative for hearing loss.   Eyes: Negative for blurred vision and redness.  Respiratory: Negative for shortness of  breath.   Cardiovascular: Positive for palpitations. Negative for chest pain, orthopnea, claudication, leg swelling and PND.  Gastrointestinal: Positive for nausea and vomiting. Negative for melena.  Genitourinary: Negative for dysuria and flank pain.  Musculoskeletal: Negative for falls and myalgias.  Skin: Negative for itching and rash.  Neurological: Negative for dizziness and loss of consciousness.  Endo/Heme/Allergies: Negative for polydipsia.  Psychiatric/Behavioral: Negative for substance abuse.    EKGs/Labs/Other Studies Reviewed:    The following studies were reviewed today: No cardiac studies  EKG:  EKG is  ordered today.  The ekg ordered today demonstrates NSR with HR 82  Recent Labs: 06/10/2020:  BUN 8; Creatinine, Ser 0.57; Potassium 4.4; Sodium 138 06/24/2020: ALT 91; Hemoglobin 14.6; Platelets 341  Recent Lipid Panel    Component Value Date/Time   CHOL 227 (H) 02/01/2018 0959   TRIG 204 (H) 02/01/2018 0959   HDL 50 02/01/2018 0959   CHOLHDL 4.5 (H) 02/01/2018 0959   LDLCALC 136 (H) 02/01/2018 3419     Physical Exam:    VS:  There were no vitals taken for this visit.    Wt Readings from Last 3 Encounters:  06/24/20 186 lb 8 oz (84.6 kg)  05/13/20 185 lb 12.8 oz (84.3 kg)  04/28/20 184 lb 9.6 oz (83.7 kg)     GEN:  Well nourished, well developed in no acute distress HEENT: Normal NECK: No JVD; No carotid bruits CARDIAC: RRR, no murmurs, rubs, gallops RESPIRATORY:  Clear to auscultation without rales, wheezing or rhonchi  ABDOMEN: Soft, non-tender, non-distended MUSCULOSKELETAL:  No edema; No deformity  SKIN: Warm and dry NEUROLOGIC:  Alert and oriented x 3 PSYCHIATRIC:  Normal affect   ASSESSMENT:    No diagnosis found. PLAN:    In order of problems listed above:  #Hypertensive Urgency: #Palpitations: #?SVT: Patient with episode of significant HTN prior to receiving her tysabri infusion with associated tachycardia with HR reportedly 130s (strips not available). Was given hydralazine x2 IV doses with improvement of BP to 120s per report but HR jumped up as high as 180s. Was told she may have SVT, however, strips not available to review. She was symptomatic at that time with headache, nausea (with eventual vomiting), and palpitaitons. Went to urgent care where she was started on metop 50mg  BID and valsartan 40mg  daily -Continue metop 50mg  BID -Continue valsartan 40mg  daily -No palpitations currently or since that episode; if recur, will place a monitor -Agree with progesterone-only OCP as estrogen containing OCP may worsen HTN    Medication Adjustments/Labs and Tests Ordered: Current medicines are reviewed at length with the patient today.  Concerns regarding  medicines are outlined above.  No orders of the defined types were placed in this encounter.  No orders of the defined types were placed in this encounter.   There are no Patient Instructions on file for this visit.   Signed, Freada Bergeron, MD  07/13/2020 8:48 PM    [facility]

## 2020-07-14 ENCOUNTER — Other Ambulatory Visit: Payer: Self-pay

## 2020-07-14 DIAGNOSIS — R748 Abnormal levels of other serum enzymes: Secondary | ICD-10-CM

## 2020-07-14 DIAGNOSIS — G35 Multiple sclerosis: Secondary | ICD-10-CM

## 2020-07-14 DIAGNOSIS — Z79899 Other long term (current) drug therapy: Secondary | ICD-10-CM

## 2020-07-14 MED ORDER — VALSARTAN 40 MG PO TABS: 40.0000 mg | ORAL_TABLET | Freq: Every day | ORAL | 3 refills | Status: DC

## 2020-07-15 ENCOUNTER — Other Ambulatory Visit: Payer: Self-pay

## 2020-07-15 ENCOUNTER — Encounter: Payer: Self-pay | Admitting: Cardiology

## 2020-07-15 ENCOUNTER — Ambulatory Visit: Payer: 59 | Admitting: Cardiology

## 2020-07-15 ENCOUNTER — Other Ambulatory Visit: Payer: Self-pay | Admitting: Neurology

## 2020-07-15 VITALS — BP 128/80 | HR 93 | Ht 62.0 in | Wt 187.0 lb

## 2020-07-15 DIAGNOSIS — G35 Multiple sclerosis: Secondary | ICD-10-CM

## 2020-07-15 DIAGNOSIS — R002 Palpitations: Secondary | ICD-10-CM

## 2020-07-15 DIAGNOSIS — Z79899 Other long term (current) drug therapy: Secondary | ICD-10-CM

## 2020-07-15 DIAGNOSIS — I10 Essential (primary) hypertension: Secondary | ICD-10-CM

## 2020-07-15 DIAGNOSIS — R748 Abnormal levels of other serum enzymes: Secondary | ICD-10-CM

## 2020-07-15 LAB — CBC WITH DIFFERENTIAL/PLATELET
Basophils Absolute: 0.1 10*3/uL (ref 0.0–0.2)
Basos: 1 %
EOS (ABSOLUTE): 0.5 10*3/uL — ABNORMAL HIGH (ref 0.0–0.4)
Eos: 4 %
Hematocrit: 41.4 % (ref 34.0–46.6)
Hemoglobin: 14.3 g/dL (ref 11.1–15.9)
Immature Grans (Abs): 0.1 10*3/uL (ref 0.0–0.1)
Immature Granulocytes: 1 %
Lymphocytes Absolute: 6.9 10*3/uL — ABNORMAL HIGH (ref 0.7–3.1)
Lymphs: 51 %
MCH: 29.7 pg (ref 26.6–33.0)
MCHC: 34.5 g/dL (ref 31.5–35.7)
MCV: 86 fL (ref 79–97)
Monocytes Absolute: 1.2 10*3/uL — ABNORMAL HIGH (ref 0.1–0.9)
Monocytes: 9 %
Neutrophils Absolute: 4.5 10*3/uL (ref 1.4–7.0)
Neutrophils: 34 %
Platelets: 369 10*3/uL (ref 150–450)
RBC: 4.81 x10E6/uL (ref 3.77–5.28)
RDW: 13.5 % (ref 11.7–15.4)
WBC: 13.3 10*3/uL — ABNORMAL HIGH (ref 3.4–10.8)

## 2020-07-15 LAB — HEPATIC FUNCTION PANEL
ALT: 119 IU/L — ABNORMAL HIGH (ref 0–32)
AST: 86 IU/L — ABNORMAL HIGH (ref 0–40)
Albumin: 4.9 g/dL (ref 3.9–5.0)
Alkaline Phosphatase: 86 IU/L (ref 44–121)
Bilirubin Total: 0.4 mg/dL (ref 0.0–1.2)
Bilirubin, Direct: <0.1 mg/dL (ref 0.00–0.40)
Total Protein: 7.3 g/dL (ref 6.0–8.5)

## 2020-07-15 MED ORDER — METOPROLOL SUCCINATE ER 50 MG PO TB24: 50.0000 mg | ORAL_TABLET | Freq: Every day | ORAL | 3 refills | Status: DC

## 2020-07-15 NOTE — Patient Instructions (Signed)
Medication Instructions:   STOP TAKING METOPROLOL TARTRATE (LOPRESSOR) NOW  START TAKING METOPROLOL SUCCINATE (TOPROL XL) 50 MG BY MOUTH AT BEDTIME  *If you need a refill on your cardiac medications before your next appointment, please call your pharmacy*   Follow-Up: At [facility], you and your health needs are our priority.  As part of our continuing mission to provide you with exceptional heart care, we have created designated Provider Care Teams.  These Care Teams include your primary Cardiologist (physician) and Advanced Practice Providers (APPs -  Physician Assistants and Nurse Practitioners) who all work together to provide you with the care you need, when you need it.  We recommend signing up for the patient portal called "MyChart".  Sign up information is provided on this After Visit Summary.  MyChart is used to connect with patients for Virtual Visits (Telemedicine).  Patients are able to view lab/test results, encounter notes, upcoming appointments, etc.  Non-urgent messages can be sent to your provider as well.   To learn more about what you can do with MyChart, go to [email or web address].    Your next appointment:   1 year(s)  The format for your next appointment:   In Person  Provider:   Gwyndolyn Kaufman, MD

## 2020-07-15 NOTE — Progress Notes (Signed)
Cardiology Office Note:    Date:  07/15/2020   ID:  Janet Duran, DOB [birth year], MRN [id number]  PCP:  Pollak, Adriana M, [place]  Cardiologist:  None  Advanced Practice Provider:  No care team member to display Electrophysiologist:  None   Referring MD: Trinna Post, PA-C    History of Present Illness:    Janet Duran is a 25 y.o. female with a hx of MS and depression who was presents to clinic for follow-up of HTN.  Patient was initially seen on 05/13/20 after she had an episode of severe hypertension and tachycardia when receiving her tysabri infusion for her MS. Specifically, when she arrived for her infusion, her blood pressure was high and her HR was 130. Received hydralazine IV and her HR went up to 160, but blood pressure went down to 120/80s. Got an additional dose of IV hydralazine and HR went up to 180s. She developed a headache, nausea and palpitations at that time. She was told to go to the ER after she left for further evaluation. Shortly after she left, she started vomiting which continued for about 1.5 hours.  She was seen at urgent care where she reportedly was told she had SVT (no strips available). Her symptoms gradually improved and she was started on  metoprolol 50mg  BID.    She was notably on a combo OCP which was just changed to progesterone only OCP per her primary care. We had her continue the metop and follow with HTN clinic for further monitoring. She was subsequently stared on valsartan 40mg  daily.  Today, she is doing well since her last office visit. Blood pressure is much better controlled with no recurrent episodes of severe HTN or tachycardia with tysabri infusions. Has been taking metop 50mg  BID and valsartan 40mg  daily (although has been out of valsartan for a couple of days and blood pressure has been okay; she is wondering if she needs the medication). Otherwise, she denies any LE edema,  lightheadedness, or chest pain. Her LFTs were elevated on repeat lab testing for which Neurology is monitoring given concern it is related to her tysabri.   Past Medical History:  Diagnosis Date  . Anxiety   . Depression   . Ectopic beat   . Ectopic heartbeat   . Migraine   . Multiple sclerosis ([place])     Past Surgical History:  Procedure Laterality Date  . NO PAST SURGERIES      Current Medications: Current Meds  Medication Sig  . metoprolol succinate (TOPROL-XL) 50 MG 24 hr tablet Take 1 tablet (50 mg total) by mouth at bedtime. Take with or immediately following a meal.  . natalizumab (TYSABRI) 300 MG/15ML injection Inject 300 mg into the vein every 6 (six) weeks.  . norethindrone (CAMILA) 0.35 MG tablet Take 1 tablet (0.35 mg total) by mouth daily.  . rizatriptan (MAXALT) 10 MG tablet PLEASE SEE ATTACHED FOR DETAILED DIRECTIONS  . tolterodine (DETROL LA) 4 MG 24 hr capsule Take 1 capsule (4 mg total) by mouth daily.  . valsartan (DIOVAN) 40 MG tablet Take 1 tablet (40 mg total) by mouth daily.  . Vitamin D, Ergocalciferol, (DRISDOL) 1.25 MG (50000 UNIT) CAPS capsule Take 50,000 Units by mouth every 7 (seven) days.  . [DISCONTINUED] metoprolol tartrate (LOPRESSOR) 50 MG tablet Take 1 tablet (50 mg total) by mouth 2 (two) times daily.     Allergies:   Patient has no known allergies.  Social History   Socioeconomic History  . Marital status: Single    Spouse name: Not on file  . Number of children: 0  . Years of education: Bachelors   . Highest education level: Not on file  Occupational History  . 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 Smoking status: Never Smoker  . Smokeless tobacco: Never Used  Substance and Sexual Activity  . Alcohol use: Yes    Comment: rare  . Drug use: No  . Sexual activity: Not on file  Other Topics Concern  . Not on file  Social History Narrative   Right handed    Lives with boyfriend   Caffeine use: Soda daily ( 1 per day)    Social Determinants of Health   Financial Resource Strain: Not on file  Food Insecurity: Not on file  Transportation Needs: Not on file  Physical Activity: Not on file  Stress: Not on file  Social Connections: Not on file     Family History: The patient's family history is not on file. She was adopted.  ROS:   Please see the history of present illness.    Review of Systems  Constitutional: Positive for malaise/fatigue. Negative for chills and fever.  HENT: Negative for congestion.   Respiratory: Negative for shortness of breath.   Cardiovascular: Negative for chest pain, palpitations, orthopnea, leg swelling and PND.  Gastrointestinal: Negative for nausea and vomiting.  Genitourinary: Negative for dysuria and hematuria.  Skin: Negative for rash.  Neurological: Negative for dizziness, loss of consciousness and headaches.    EKGs/Labs/Other Studies Reviewed:    The following studies were reviewed today: No cardiac studies  EKG:  07/15/20: EKG  not ordered today   Recent Labs: 06/10/2020: BUN 8; Creatinine, Ser 0.57; Potassium 4.4; Sodium 138 07/14/2020: ALT 119; Hemoglobin 14.3; Platelets 369  Recent Lipid Panel    Component Value Date/Time   CHOL 227 (H) 02/01/2018 0959   TRIG 204 (H) 02/01/2018 0959   HDL 50 02/01/2018 0959   CHOLHDL 4.5 (H) 02/01/2018 0959   LDLCALC 136 (H) 02/01/2018 0959     Physical Exam:    VS:  BP 128/80   Pulse 93   Ht 5\' 2"  (1.575 m)   Wt 187 lb (84.8 kg)   SpO2 98%   BMI 34.20 kg/m     Wt Readings from Last 3 Encounters:  07/15/20 187 lb (84.8 kg)  06/24/20 186 lb 8 oz (84.6 kg)  05/13/20 185 lb 12.8 oz (84.3 kg)     GEN:  Well nourished, well developed in no acute distress, comfortable HEENT: Normal NECK: No JVD; No carotid bruits CARDIAC: RRR, no murmurs, rubs, gallops RESPIRATORY:  CTAB, no wheezing ABDOMEN: Soft, non-tender, non-distended MUSCULOSKELETAL:  No edema; No deformity  SKIN: Warm and dry NEUROLOGIC:   Alert and oriented x 3 PSYCHIATRIC:  Normal affect   ASSESSMENT:    1. Hypertension, unspecified type   2. Palpitations   3. Multiple sclerosis ([place])    PLAN:    In order of problems listed above:  #Hypertensive Urgency: #Palpitations: Significantly improved. Patient with episode of significant HTN prior to receiving her tysabri infusion with associated tachycardia with HR reportedly 130s (strips not available). Was given hydralazine x2 IV doses with improvement of BP to 120s per report but HR jumped up as high as 180s. Was told she may have SVT, however, strips not available to review. She was symptomatic at that time with headache, nausea (with eventual vomiting),  and palpitaitons. Went to urgent care where she was started on metop 50mg  BID. She has since been started on valsartan as well due to elevated blood pressures in HTN clinic. She is doing well with no recurrence of symptoms with infusions. Blood pressure is well controlled -Change metop tartrate to metop succinate 50mg  XL daily -Continue valsartan 40mg  daily; patient can monitor blood pressures at home and take as needed if SBP>140; discussed that if she is average a SBP>120s on average, she will need the medication daily -No palpitations currently or since that episode; if recur, will place a monitor -Agree with progesterone-only OCP as estrogen containing OCP may worsen HTN  #Multiple Sclerosis: Managed by Neurology. On tysabri infusions. -Follow-up with Neuro as scheduled; they are also watching LFTs  Medication Adjustments/Labs and Tests Ordered: Current medicines are reviewed at length with the patient today.  Concerns regarding medicines are outlined above.  No orders of the defined types were placed in this encounter.  Meds ordered this encounter  Medications  . metoprolol succinate (TOPROL-XL) 50 MG 24 hr tablet    Sig: Take 1 tablet (50 mg total) by mouth at bedtime. Take with or immediately following a meal.     Dispense:  90 tablet    Refill:  3    Stopped tartrate and switched to this.    Patient Instructions  Medication Instructions:   STOP TAKING METOPROLOL TARTRATE (LOPRESSOR) NOW  START TAKING METOPROLOL SUCCINATE (TOPROL XL) 50 MG BY MOUTH AT BEDTIME  *If you need a refill on your cardiac medications before your next appointment, please call your pharmacy*   Follow-Up: At [facility], you and your health needs are our priority.  As part of our continuing mission to provide you with exceptional heart care, we have created designated Provider Care Teams.  These Care Teams include your primary Cardiologist (physician) and Advanced Practice Providers (APPs -  Physician Assistants and Nurse Practitioners) who all work together to provide you with the care you need, when you need it.  We recommend signing up for the patient portal called "MyChart".  Sign up information is provided on this After Visit Summary.  MyChart is used to connect with patients for Virtual Visits (Telemedicine).  Patients are able to view lab/test results, encounter notes, upcoming appointments, etc.  Non-urgent messages can be sent to your provider as well.   To learn more about what you can do with MyChart, go to [email or web address].    Your next appointment:   1 year(s)  The format for your next appointment:   In Person  Provider:   Gwyndolyn Kaufman, MD       Brooks Sailors as a scribe for Freada Bergeron, MD.,have documented all relevant documentation on the behalf of Freada Bergeron, MD,as directed by  Freada Bergeron, MD while in the presence of Freada Bergeron, MD.  I, Freada Bergeron, MD, have reviewed all documentation for this visit. The documentation on 07/15/20 for the exam, diagnosis, procedures, and orders are all accurate and complete.  Signed, Freada Bergeron, MD  07/15/2020 5:10 PM    [facility]

## 2020-07-16 ENCOUNTER — Telehealth: Payer: Self-pay | Admitting: *Deleted

## 2020-07-16 NOTE — Telephone Encounter (Signed)
-----   Message from Britt Bottom, MD sent at 07/15/2020  8:28 PM EDT ----- Hepatitis labs were normal.  She should continue to stay off of Tylenol and not drink any alcohol.    I would like to recheck the liver function test again in 2 to 3 weeks.  If it increases any further we will need to stop the Tysabri.

## 2020-08-03 ENCOUNTER — Other Ambulatory Visit (INDEPENDENT_AMBULATORY_CARE_PROVIDER_SITE_OTHER): Payer: 59

## 2020-08-03 DIAGNOSIS — G35 Multiple sclerosis: Secondary | ICD-10-CM

## 2020-08-03 DIAGNOSIS — R748 Abnormal levels of other serum enzymes: Secondary | ICD-10-CM

## 2020-08-03 DIAGNOSIS — Z0289 Encounter for other administrative examinations: Secondary | ICD-10-CM

## 2020-08-03 DIAGNOSIS — Z79899 Other long term (current) drug therapy: Secondary | ICD-10-CM

## 2020-08-04 LAB — HEPATIC FUNCTION PANEL
ALT: 63 IU/L — ABNORMAL HIGH (ref 0–32)
ALT: 67 IU/L — ABNORMAL HIGH (ref 0–32)
AST: 48 IU/L — ABNORMAL HIGH (ref 0–40)
AST: 53 IU/L — ABNORMAL HIGH (ref 0–40)
Albumin: 4.5 g/dL (ref 3.9–5.0)
Albumin: 4.7 g/dL (ref 3.9–5.0)
Alkaline Phosphatase: 81 IU/L (ref 44–121)
Alkaline Phosphatase: 85 IU/L (ref 44–121)
Bilirubin Total: 0.5 mg/dL (ref 0.0–1.2)
Bilirubin Total: 0.5 mg/dL (ref 0.0–1.2)
Bilirubin, Direct: 0.12 mg/dL (ref 0.00–0.40)
Bilirubin, Direct: 0.15 mg/dL (ref 0.00–0.40)
Total Protein: 7.1 g/dL (ref 6.0–8.5)
Total Protein: 7.1 g/dL (ref 6.0–8.5)

## 2020-08-04 LAB — HEPATITIS B SURFACE ANTIGEN: Hepatitis B Surface Ag: NEGATIVE

## 2020-08-04 LAB — HEPATITIS B CORE ANTIBODY, TOTAL: Hep B Core Total Ab: NEGATIVE

## 2020-08-04 LAB — HEPATITIS B SURFACE ANTIBODY,QUALITATIVE: Hep B Surface Ab, Qual: NONREACTIVE

## 2020-08-04 LAB — HEPATITIS C ANTIBODY: Hep C Virus Ab: <0.1 s/co ratio (ref 0.0–0.9)

## 2020-08-06 ENCOUNTER — Ambulatory Visit: Payer: 59

## 2020-09-01 ENCOUNTER — Encounter: Payer: BC Managed Care – PPO | Admitting: Physician Assistant

## 2020-09-09 ENCOUNTER — Telehealth: Payer: Self-pay | Admitting: Physician Assistant

## 2020-09-09 MED ORDER — METOPROLOL SUCCINATE ER 50 MG PO TB24: 50.0000 mg | ORAL_TABLET | Freq: Every day | ORAL | 1 refills | Status: DC

## 2020-09-09 NOTE — Telephone Encounter (Signed)
[facility] faxed refill request for the following medications:  metoprolol succinate (TOPROL-XL) 50 MG 24 hr tablet   Please advise.  

## 2020-12-10 ENCOUNTER — Telehealth: Payer: Self-pay | Admitting: *Deleted

## 2020-12-10 NOTE — Telephone Encounter (Signed)
Took call from phone staff and spoke w/ Michelle/[employer]. States Mauri Reading re-auth form mentioned pt had not had JCV collected since 07/04/20 but then gave results. Wanting clarification. Advised pt had JCV ab collected last 06/25/20, negative, index: 0.13. She will update form. I faxed results to them at [phone]. Received fax confirmation.

## 2020-12-23 ENCOUNTER — Encounter: Payer: Self-pay | Admitting: Family Medicine

## 2020-12-23 ENCOUNTER — Ambulatory Visit: Payer: 59 | Admitting: Family Medicine

## 2020-12-23 ENCOUNTER — Telehealth: Payer: Self-pay | Admitting: *Deleted

## 2020-12-23 ENCOUNTER — Other Ambulatory Visit: Payer: Self-pay

## 2020-12-23 VITALS — BP 137/89 | HR 103 | Ht 62.0 in | Wt 185.5 lb

## 2020-12-23 DIAGNOSIS — Z79899 Other long term (current) drug therapy: Secondary | ICD-10-CM | POA: Diagnosis not present

## 2020-12-23 DIAGNOSIS — G35 Multiple sclerosis: Secondary | ICD-10-CM

## 2020-12-23 DIAGNOSIS — G43109 Migraine with aura, not intractable, without status migrainosus: Secondary | ICD-10-CM

## 2020-12-23 DIAGNOSIS — E559 Vitamin D deficiency, unspecified: Secondary | ICD-10-CM

## 2020-12-23 DIAGNOSIS — M545 Low back pain, unspecified: Secondary | ICD-10-CM

## 2020-12-23 DIAGNOSIS — G8929 Other chronic pain: Secondary | ICD-10-CM

## 2020-12-23 DIAGNOSIS — R3915 Urgency of urination: Secondary | ICD-10-CM

## 2020-12-23 DIAGNOSIS — R748 Abnormal levels of other serum enzymes: Secondary | ICD-10-CM | POA: Diagnosis not present

## 2020-12-23 MED ORDER — BACLOFEN 10 MG PO TABS: 10.0000 mg | ORAL_TABLET | Freq: Three times a day (TID) | ORAL | 11 refills | Status: DC

## 2020-12-23 MED ORDER — MELOXICAM 7.5 MG PO TABS: 7.5000 mg | ORAL_TABLET | Freq: Every day | ORAL | 3 refills | Status: DC

## 2020-12-23 NOTE — Progress Notes (Signed)
Chief Complaint  Patient presents with   Follow-up    RM 1, alone. Last seen 06/24/20.On Tysabri for MS.Tolerating well, no issues.     HISTORY OF PRESENT ILLNESS:  12/23/20 ALL:  Janet Duran is a 25 y.o. female here today for follow up for RRMS. She continues Tysabri infusions every 6 weeks (tolerability). Labs have been normal with exception of mildly elevated liver enzymes. Hepatitis labs negative. JCV 0.13, negative 06/2020. Last MRI 12/2019 stable.   She denies exacerbating symptoms. She does have more generalized neck and lumbar back pain. She describes tightness and dull achy pain. Keeps her up at night. She doesn't feel like she sleeps very well. She denies changes in gait. No difficulty walking and does not use assistive device. She has taken Tylenol and Ibuprofen with minimal relief.   She stays tired. She feels depression is fairly stable. She has tried taking antidepressants in the past that were not helpful. She is trying to increase exercise.   Detrol LA helps with urinary frequency.   She continues vitamin D supplements, 50,000iu daily.    HISTORY (copied from Dr Garth Bigness previous note)  Janet Duran is a 25 y.o. woman with relapsing remitting multiple sclerosis.   Update 06/25/2018 She is on Tysabri and tolerates it well.  No exacerbations.   She had elevated LFTs 2 weeks ago.    We will recheck and check other labs.      Gait is unchanged with mild stumbling at times.   She denies numbness or weakness in her arms or legs.   She notes urinary urgency improved with Detrol LA     She gets a popping sensation in her neck when she bends it but does not have a classic Lhermitte sign.     She has fatigue, physical > cognitive    Fatigue is much greater than sleepiness.  She sleeps well most nights -- 8 hours on average..    She notes depression and anxiety, not helped by a couple different med's    She notes mild brain fog and has had trouble coming up with  the right word at times.    She works at [employer].      She is taking vitamin D supplements 50000 weekly but recently stopped.  Vitamin D was deficient at 15.3.Marland Kitchen  She had three B12 injectons for deficiency (184) and was taking pills    She has some migraine headache.   These have not bothered her too much.  She occasionally takes rizatriptan     MS history: She was diagnosed with MS in March 2021 after presenting with fuzzy vision in both eyes and numbness in her right hand.   She had a migraine at the time.   Symptoms improved after 15 minutes.   She saw her PCP for suspected complicated migraine and was referred to Dr. Melrose Nakayama.   She had a brain MRI which was consistent with MS.   She also had a cervical spine MRI.   To confirm the diagnosis, she underwent an LP.  It showed oligoclonal bands confirming the diagnosis of MS.    She was started on Tysabri.   She is on Tysabri 300 mg every 6 weeks and tolerates it well.    She has not had not had any exacerbations.   Her next infusion is scheduled for 01/24/2020.   Her last JCV antibody was 0.10 (negative)  08/07/2019.   IMAGING MRI of the brain 04/27/2019  showed multiple T2/FLAIR hyperintense foci in the hemispheres, many in the periventricular white matter radially oriented to the ventricles.  The foci was also present in the lateral left thalamus, right pons and right cerebellum.  None of the foci enhanced.   MRI of the cervical spine showed abnormal T2 hyperintense foci to the left adjacent to C6 and centrally adjacent to C4-C5.  The C6 focus enhanced after contrast.   MRI 01/07/2020 showed T2/FLAIR hyperintense foci in the hemispheres, left thalamus and right cerebellar hemisphere in a pattern and configuration consistent with chronic demyelinating plaque associated with multiple sclerosis.  None of the foci appear to be acute.  They do not enhance.    There is a normal enhancement pattern.   LABS Lab work from 05/02/2019 was reviewed.  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   REVIEW OF SYSTEMS: Out of a complete 14 system review of symptoms, the patient complains only of the following symptoms, fatigue, depression, back and neck pain.and all other reviewed systems are negative.   ALLERGIES: No Known Allergies   HOME MEDICATIONS: Outpatient Medications Prior to Visit  Medication Sig Dispense Refill   metoprolol succinate (TOPROL-XL) 50 MG 24 hr tablet Take 1 tablet (50 mg total) by mouth at bedtime. Take with or immediately following a meal. 90 tablet 1   natalizumab (TYSABRI) 300 MG/15ML injection Inject 300 mg into the vein every 6 (six) weeks.     norethindrone (CAMILA) 0.35 MG tablet Take 1 tablet (0.35 mg total) by mouth daily. 28 tablet 11   rizatriptan (MAXALT) 10 MG tablet PLEASE SEE ATTACHED FOR DETAILED DIRECTIONS     tolterodine (DETROL LA) 4 MG 24 hr capsule Take 1 capsule (4 mg total) by mouth daily. 90 capsule 3   valsartan (DIOVAN) 40 MG tablet Take 1 tablet (40 mg total) by mouth daily. 90 tablet 3   Vitamin D, Ergocalciferol, (DRISDOL) 1.25 MG (50000 UNIT) CAPS capsule Take 50,000 Units by mouth every 7 (seven) days.     No facility-administered medications prior to visit.     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   Tobacco Use   Smoking status: Never   Smokeless tobacco: Never  Substance and Sexual Activity   Alcohol use: Yes    Comment: rare   Drug use: No   Sexual activity: Not on file  Other Topics Concern   Not on file  Social History Narrative   Right handed    Lives with boyfriend   Caffeine use: Soda daily (  1 per day)   Social Determinants of Health   Financial Resource Strain: Not on file  Food Insecurity: Not on file  Transportation Needs: Not on file  Physical Activity: Not on file  Stress: Not on file  Social Connections: Not on file  Intimate Partner Violence: Not on file     PHYSICAL EXAM  Vitals:   12/23/20 1257  BP: 137/89  Pulse: (!) 103  Weight: 185 lb 8 oz (84.1 kg)  Height: 5\' 2"  (1.575 m)   Body mass index is 33.93 kg/m.  Generalized: Well developed, in no acute distress  Cardiology: normal rate and rhythm, no murmur auscultated  Respiratory: clear to auscultation bilaterally    Neurological examination  Mentation: Alert oriented to time, place, history taking. Follows all commands speech and language fluent Cranial nerve II-XII: Pupils were equal round reactive to light. Extraocular movements were full, visual field were full on confrontational test. Facial sensation and strength were normal. Head turning and shoulder shrug  were normal and symmetric. Motor: The motor testing reveals 5 over 5 strength of all 4 extremities. Good symmetric motor tone is noted throughout.  Sensory: Sensory testing is intact to soft touch on all 4 extremities. No evidence of extinction is noted.  Coordination: Cerebellar testing reveals good finger-nose-finger and heel-to-shin bilaterally.  Gait and station: Gait is normal.  Reflexes: Deep tendon reflexes are symmetric and  normal bilaterally.    DIAGNOSTIC DATA (LABS, IMAGING, TESTING) - I reviewed patient records, labs, notes, testing and imaging myself where available.  Lab Results  Component Value Date   WBC 13.3 (H) 07/14/2020   HGB 14.3 07/14/2020   HCT 41.4 07/14/2020   MCV 86 07/14/2020   PLT 369 07/14/2020      Component Value Date/Time   NA 138 06/10/2020 1210   K 4.4 06/10/2020 1210   CL 100 06/10/2020 1210   CO2 21 06/10/2020 1210   GLUCOSE 163 (H) 06/10/2020 1210   BUN 8 06/10/2020 1210   CREATININE 0.57 06/10/2020 1210   CALCIUM 10.1 06/10/2020 1210   PROT 7.1 08/03/2020 1305   PROT 7.1 08/03/2020 1305   ALBUMIN 4.5 08/03/2020 1305   ALBUMIN 4.7 08/03/2020 1305   AST 48 (H) 08/03/2020 1305   AST 53 (H) 08/03/2020 1305   ALT 67 (H) 08/03/2020 1305   ALT 63 (H) 08/03/2020 1305   ALKPHOS 85 08/03/2020 1305   ALKPHOS 81 08/03/2020 1305   BILITOT 0.5 08/03/2020 1305   BILITOT 0.5 08/03/2020 1305   GFRNONAA 128 02/01/2019 1543   GFRAA 147 02/01/2019 1543   Lab Results  Component Value Date   CHOL 227 (H) 02/01/2018   HDL 50 02/01/2018   LDLCALC 136 (H) 02/01/2018   TRIG 204 (H) 02/01/2018   CHOLHDL 4.5 (H) 02/01/2018   No results found for: HGBA1C No results found for: VITAMINB12 Lab Results  Component Value Date   TSH 2.280 02/01/2019    No flowsheet data found.   No flowsheet data found.   ASSESSMENT AND PLAN  25 y.o. year old female  has a past medical history of Anxiety, Depression, Ectopic beat, Ectopic heartbeat, Migraine, and Multiple sclerosis ([place]). here with    Relapsing remitting multiple sclerosis ([facility])  Elevated liver enzymes  High risk medication use  Migraine with aura and without status migrainosus, not intractable  Urinary urgency  Janet Duran is doing well, today. No new or exacerbating symptoms. She will continue Tysabri infusions every 6 weeks. I will update labs. MRI stable  12/2019. She will continue detrol LA. I will try her on  meloxicam 7.5mg  daily and baclofen 10mg  up to three times daily as needed for neck and back pain. She will continue vitamin D as prescribed. Healthy lifestyle habits encouraged. She will consider psychology if depression worsens. She will follow up in 6 months with Dr Felecia Shelling.    No orders of the defined types were placed in this encounter.    No orders of the defined types were placed in this encounter.     Debbora Presto, MSN, FNP-C 12/23/2020, 1:08 PM  [facility] [address] [place], [place] [address] [phone]

## 2020-12-23 NOTE — Telephone Encounter (Signed)
Placed JCV lab in [facility] lock box for routine lab pick up. Results pending. 

## 2020-12-23 NOTE — Patient Instructions (Signed)
Below is our plan:  We will continue Tysabri every 6 weeks. Continue Detrol LA for urinary frequency. I will call in meloxicam 7.5mg  daily and baclofen 10mg  up to three times daily for back pain and tightness. Start baclofen once daily and build up as tolerated. May take PRN if symptoms are improved.   Please make sure you are staying well hydrated. I recommend 50-60 ounces daily. Well balanced diet and regular exercise encouraged. Consistent sleep schedule with 6-8 hours recommended.   Please continue follow up with care team as directed.   Follow up with Dr Felecia Shelling in 6 months   You may receive a survey regarding today's visit. I encourage you to leave honest feed back as I do use this information to improve patient care. Thank you for seeing me today!

## 2020-12-24 LAB — CBC WITH DIFFERENTIAL/PLATELET
Basophils Absolute: 0.1 10*3/uL (ref 0.0–0.2)
Basos: 1 %
EOS (ABSOLUTE): 0.5 10*3/uL — ABNORMAL HIGH (ref 0.0–0.4)
Eos: 3 %
Hematocrit: 43.3 % (ref 34.0–46.6)
Hemoglobin: 15 g/dL (ref 11.1–15.9)
Immature Grans (Abs): 0.1 10*3/uL (ref 0.0–0.1)
Immature Granulocytes: 1 %
Lymphocytes Absolute: 7.3 10*3/uL — ABNORMAL HIGH (ref 0.7–3.1)
Lymphs: 48 %
MCH: 29.9 pg (ref 26.6–33.0)
MCHC: 34.6 g/dL (ref 31.5–35.7)
MCV: 86 fL (ref 79–97)
Monocytes Absolute: 1.2 10*3/uL — ABNORMAL HIGH (ref 0.1–0.9)
Monocytes: 8 %
Neutrophils Absolute: 5.9 10*3/uL (ref 1.4–7.0)
Neutrophils: 39 %
Platelets: 364 10*3/uL (ref 150–450)
RBC: 5.01 x10E6/uL (ref 3.77–5.28)
RDW: 13.3 % (ref 11.7–15.4)
WBC: 15.1 10*3/uL — ABNORMAL HIGH (ref 3.4–10.8)

## 2020-12-24 LAB — VITAMIN D 25 HYDROXY (VIT D DEFICIENCY, FRACTURES): Vit D, 25-Hydroxy: 26.1 ng/mL — ABNORMAL LOW (ref 30.0–100.0)

## 2020-12-24 LAB — HEPATIC FUNCTION PANEL
ALT: 134 IU/L — ABNORMAL HIGH (ref 0–32)
AST: 84 IU/L — ABNORMAL HIGH (ref 0–40)
Albumin: 5.1 g/dL — ABNORMAL HIGH (ref 3.9–5.0)
Alkaline Phosphatase: 86 IU/L (ref 44–121)
Bilirubin Total: 0.4 mg/dL (ref 0.0–1.2)
Bilirubin, Direct: 0.11 mg/dL (ref 0.00–0.40)
Total Protein: 7.5 g/dL (ref 6.0–8.5)

## 2020-12-30 ENCOUNTER — Encounter: Payer: Self-pay | Admitting: Nurse Practitioner

## 2020-12-30 ENCOUNTER — Other Ambulatory Visit (HOSPITAL_COMMUNITY)
Admission: RE | Admit: 2020-12-30 | Discharge: 2020-12-30 | Disposition: A | Discharge status: Home / Self Care | Payer: 59 | Source: Ambulatory Visit | Attending: Nurse Practitioner | Admitting: Nurse Practitioner

## 2020-12-30 ENCOUNTER — Ambulatory Visit: Payer: 59 | Admitting: Nurse Practitioner

## 2020-12-30 ENCOUNTER — Other Ambulatory Visit: Payer: Self-pay

## 2020-12-30 VITALS — BP 128/86 | HR 88 | Temp 97.9°F | Ht 61.75 in | Wt 183.8 lb

## 2020-12-30 DIAGNOSIS — C9110 Chronic lymphocytic leukemia of B-cell type not having achieved remission: Secondary | ICD-10-CM | POA: Insufficient documentation

## 2020-12-30 DIAGNOSIS — Z0001 Encounter for general adult medical examination with abnormal findings: Secondary | ICD-10-CM | POA: Diagnosis not present

## 2020-12-30 DIAGNOSIS — R748 Abnormal levels of other serum enzymes: Secondary | ICD-10-CM | POA: Diagnosis not present

## 2020-12-30 DIAGNOSIS — Z124 Encounter for screening for malignant neoplasm of cervix: Secondary | ICD-10-CM | POA: Insufficient documentation

## 2020-12-30 DIAGNOSIS — Z13220 Encounter for screening for lipoid disorders: Secondary | ICD-10-CM

## 2020-12-30 DIAGNOSIS — E538 Deficiency of other specified B group vitamins: Secondary | ICD-10-CM

## 2020-12-30 DIAGNOSIS — Z136 Encounter for screening for cardiovascular disorders: Secondary | ICD-10-CM

## 2020-12-30 DIAGNOSIS — D72829 Elevated white blood cell count, unspecified: Secondary | ICD-10-CM | POA: Insufficient documentation

## 2020-12-30 DIAGNOSIS — R1013 Epigastric pain: Secondary | ICD-10-CM

## 2020-12-30 DIAGNOSIS — D72828 Other elevated white blood cell count: Secondary | ICD-10-CM | POA: Diagnosis not present

## 2020-12-30 NOTE — Assessment & Plan Note (Addendum)
Onset 2020, worse in last 1year Negative Hep. B and Hep. C Recent onset of postprandial ABD pain and diarrhea, triggered by all food type. No melena.  Hepatic Function Latest Ref Rng & Units 12/23/2020 08/03/2020 08/03/2020  Total Protein 6.0 - 8.5 g/dL 7.5 7.1 7.1  Albumin 3.9 - 5.0 g/dL 5.1(H) 4.7 4.5  AST 0 - 40 IU/L 84(H) 53(H) 48(H)  ALT 0 - 32 IU/L 134(H) 63(H) 67(H)  Alk Phosphatase 44 - 121 IU/L 86 81 85  Total Bilirubin 0.0 - 1.2 mg/dL 0.4 0.5 0.5  Bilirubin, Direct 0.00 - 0.40 mg/dL 0.11 0.15 0.12   Check ABD [place] Consider referral to GI is normal ABD [place]

## 2020-12-30 NOTE — Patient Instructions (Addendum)
You will be contacted to schedule appt for ABD [place]  Have labs completed by neurology lab when getting other labs.  Preventive Care 54-25 Years Old, Female Preventive care refers to lifestyle choices and visits with your health care provider that can promote health and wellness. Preventive care visits are also called wellness exams. What can I expect for my preventive care visit? Counseling During your preventive care visit, your health care provider may ask about your: Medical history, including: Past medical problems. Family medical history. Pregnancy history. Current health, including: Menstrual cycle. Method of birth control. Emotional well-being. Home life and relationship well-being. Sexual activity and sexual health. Lifestyle, including: Alcohol, nicotine or tobacco, and drug use. Access to firearms. Diet, exercise, and sleep habits. Work and work Statistician. Sunscreen use. Safety issues such as seatbelt and bike helmet use. Physical exam Your health care provider may check your: Height and weight. These may be used to calculate your BMI (body mass index). BMI is a measurement that tells if you are at a healthy weight. Waist circumference. This measures the distance around your waistline. This measurement also tells if you are at a healthy weight and may help predict your risk of certain diseases, such as type 2 diabetes and high blood pressure. Heart rate and blood pressure. Body temperature. Skin for abnormal spots. What immunizations do I need? Vaccines are usually given at various ages, according to a schedule. Your health care provider will recommend vaccines for you based on your age, medical history, and lifestyle or other factors, such as travel or where you work. What tests do I need? Screening Your health care provider may recommend screening tests for certain conditions. This may include: Pelvic exam and Pap test. Lipid and cholesterol levels. Diabetes  screening. This is done by checking your blood sugar (glucose) after you have not eaten for a while (fasting). Hepatitis B test. Hepatitis C test. HIV (human immunodeficiency virus) test. STI (sexually transmitted infection) testing, if you are at risk. BRCA-related cancer screening. This may be done if you have a family history of breast, ovarian, tubal, or peritoneal cancers. Talk with your health care provider about your test results, treatment options, and if necessary, the need for more tests. Follow these instructions at home: Eating and drinking  Eat a healthy diet that includes fresh fruits and vegetables, whole grains, lean protein, and low-fat dairy products. Take vitamin and mineral supplements as recommended by your health care provider. Do not drink alcohol if: Your health care provider tells you not to drink. You are pregnant, may be pregnant, or are planning to become pregnant. If you drink alcohol: Limit how much you have to 0-1 drink a day. Know how much alcohol is in your drink. In the [place] one drink equals one 12 oz bottle of beer (355 mL), one 5 oz glass of wine (148 mL), or one 1 oz glass of hard liquor (44 mL). Lifestyle Brush your teeth every morning and night with fluoride toothpaste. Floss one time each day. Exercise for at least 30 minutes 5 or more days each week. Do not use any products that contain nicotine or tobacco. These products include cigarettes, chewing tobacco, and vaping devices, such as e-cigarettes. If you need help quitting, ask your health care provider. Do not use drugs. If you are sexually active, practice safe sex. Use a condom or other form of protection to prevent STIs. If you do not wish to become pregnant, use a form of birth control. If you plan  to become pregnant, see your health care provider for a prepregnancy visit. Find healthy ways to manage stress, such as: Meditation, yoga, or listening to music. Journaling. Talking to a trusted  person. Spending time with friends and family. Minimize exposure to UV radiation to reduce your risk of skin cancer. Safety Always wear your seat belt while driving or riding in a vehicle. Do not drive: If you have been drinking alcohol. Do not ride with someone who has been drinking. If you have been using any mind-altering substances or drugs. While texting. When you are tired or distracted. Wear a helmet and other protective equipment during sports activities. If you have firearms in your house, make sure you follow all gun safety procedures. Seek help if you have been physically or sexually abused. What's next? Go to your health care provider once a year for an annual wellness visit. Ask your health care provider how often you should have your eyes and teeth checked. Stay up to date on all vaccines. This information is not intended to replace advice given to you by your health care provider. Make sure you discuss any questions you have with your health care provider. Document Revised: 08/05/2020 Document Reviewed: 08/05/2020 Elsevier Patient Education  [place].

## 2020-12-30 NOTE — Assessment & Plan Note (Signed)
First noted in 2019 at 11.9 asymptomatic Check B12 and iron.  CBC Latest Ref Rng & Units 12/23/2020 07/14/2020 06/24/2020  WBC 3.4 - 10.8 x10E3/uL 15.1(H) 13.3(H) 16.0(H)  Hemoglobin 11.1 - 15.9 g/dL 15.0 14.3 14.6  Hematocrit 34.0 - 46.6 % 43.3 41.4 42.2  Platelets 150 - 450 x10E3/uL [phone]

## 2020-12-30 NOTE — Progress Notes (Signed)
Subjective:    Patient ID: Janet Duran, female    DOB: 1995-06-21, 25 y.o.   MRN: [id number]  Patient presents today for CPE and eval of chronic conditions  HPI  Elevated liver enzymes Onset 2020, worse in last 1year Negative Hep. B and Hep. C Recent onset of postprandial ABD pain and diarrhea, triggered by all food type. No melena.  Hepatic Function Latest Ref Rng & Units 12/23/2020 08/03/2020 08/03/2020  Total Protein 6.0 - 8.5 g/dL 7.5 7.1 7.1  Albumin 3.9 - 5.0 g/dL 5.1(H) 4.7 4.5  AST 0 - 40 IU/L 84(H) 53(H) 48(H)  ALT 0 - 32 IU/L 134(H) 63(H) 67(H)  Alk Phosphatase 44 - 121 IU/L 86 81 85  Total Bilirubin 0.0 - 1.2 mg/dL 0.4 0.5 0.5  Bilirubin, Direct 0.00 - 0.40 mg/dL 0.11 0.15 0.12   Check ABD [place] Consider referral to GI is normal ABD [place]  B12 deficiency Repeat B12 today Last check 2021, reports 3IM injections in past  Leucocytosis First noted in 2019 at 11.9 asymptomatic Check B12 and iron.  CBC Latest Ref Rng & Units 12/23/2020 07/14/2020 06/24/2020  WBC 3.4 - 10.8 x10E3/uL 15.1(H) 13.3(H) 16.0(H)  Hemoglobin 11.1 - 15.9 g/dL 15.0 14.3 14.6  Hematocrit 34.0 - 46.6 % 43.3 41.4 42.2  Platelets 150 - 450 x10E3/uL 364 369 341   Vision:will schedule Dental:will schedule Diet:regular Exercise:2x/week, walking Weight:  Wt Readings from Last 3 Encounters:  12/30/20 183 lb 12.8 oz (83.4 kg)  12/23/20 185 lb 8 oz (84.1 kg)  07/15/20 187 lb (84.8 kg)    Sexual History (orientation,birth control, marital status, STD):no STD screen needed  Depression/Suicide: Depression screen [facility] 2/9 12/30/2020 04/28/2020 08/30/2019 03/14/2018 02/01/2018  Decreased Interest _0 Down, Depressed, Hopeless _1 PHQ - 2 Score _2 Altered sleeping _3 Tired, decreased energy _4 Change in appetite _5 Feeling bad or failure about yourself  0 _6 Trouble concentrating _7 Moving slowly or fidgety/restless _8 0 0  Suicidal  thoughts 0 0 0 0 0  PHQ-9 Score _9 Difficult doing work/chores Somewhat difficult Very difficult Very difficult Very difficult Somewhat difficult   GAD 7 : Generalized Anxiety Score 12/30/2020 04/25/2018 03/14/2018 02/01/2018  Nervous, Anxious, on Edge _10 Control/stop worrying _11 Worry too much - different things _12 Trouble relaxing _13 Restless _14 Easily annoyed or irritable _15 Afraid - awful might happen _16 Total GAD 7 Score _17 Anxiety Difficulty Somewhat difficult Extremely difficult Somewhat difficult Somewhat difficult   Immunizations: (TDAP, Hep C screen, Pneumovax, Influenza, zoster)  Health Maintenance  Topic Date Due   COVID-19 Vaccine (1) Never done   Pneumococcal Vaccination (1 - PCV) Never done   Flu Shot  Never done   Pap Smear  02/01/2021   Pap Smear  02/01/2021   Tetanus Vaccine  07/24/2023   HPV Vaccine  Completed   Hepatitis C Screening: USPSTF Recommendation to screen - Ages 63-79 yo.  Completed   HIV Screening  Completed   Fall Risk: Fall Risk  12/30/2020 04/28/2020 08/30/2019 02/01/2018  Falls in the past  year? 0 0 0 0  Number falls in past yr: 0 0 0 0  Injury with Fall? 0 0 0 0  Risk for fall due to : No Fall Risks No Fall Risks - -  Follow up Falls evaluation completed Falls evaluation completed - -   Medications and allergies reviewed with patient and updated if appropriate.  Patient Active Problem List   Diagnosis Date Noted   Leucocytosis 12/30/2020   Elevated liver enzymes 06/24/2020   High risk medication use 12/26/2019   Migraine with aura and without status migrainosus, not intractable 12/26/2019   Urinary urgency 12/26/2019   Depression 09/03/2019   Class 1 obesity without serious comorbidity with body mass index (BMI) of 34.0 to 34.9 in adult 09/03/2019   B12 deficiency 05/27/2019   Blurred vision 05/27/2019   Numbness 05/27/2019   Multiple sclerosis ([place]) 05/05/2019   Visual  changes 05/05/2019   Headache disorder 05/05/2019   Vitamin D deficiency 05/05/2019   Migraine without aura and without status migrainosus, not intractable 03/04/2019   Right arm numbness 03/04/2019   Ectopic heartbeat     Current Outpatient Medications on File Prior to Visit  Medication Sig Dispense Refill   baclofen (LIORESAL) 10 MG tablet Take 1 tablet (10 mg total) by mouth 3 (three) times daily. 90 each 11   meloxicam (MOBIC) 7.5 MG tablet Take 1 tablet (7.5 mg total) by mouth daily. 90 tablet 3   metoprolol succinate (TOPROL-XL) 50 MG 24 hr tablet Take 1 tablet (50 mg total) by mouth at bedtime. Take with or immediately following a meal. 90 tablet 1   natalizumab (TYSABRI) 300 MG/15ML injection Inject 300 mg into the vein every 6 (six) weeks.     norethindrone (CAMILA) 0.35 MG tablet Take 1 tablet (0.35 mg total) by mouth daily. 28 tablet 11   rizatriptan (MAXALT) 10 MG tablet PLEASE SEE ATTACHED FOR DETAILED DIRECTIONS     tolterodine (DETROL LA) 4 MG 24 hr capsule Take 1 capsule (4 mg total) by mouth daily. 90 capsule 3   valsartan (DIOVAN) 40 MG tablet Take 1 tablet (40 mg total) by mouth daily. 90 tablet 3   Vitamin D, Ergocalciferol, (DRISDOL) 1.25 MG (50000 UNIT) CAPS capsule Take 50,000 Units by mouth every 7 (seven) days.     No current facility-administered medications on file prior to visit.    Past Medical History:  Diagnosis Date   Anxiety    Depression    Ectopic beat    Ectopic heartbeat    Hypertension    Migraine    Multiple sclerosis (HCC)     Past Surgical History:  Procedure Laterality Date   NO PAST SURGERIES      Social History   Socioeconomic History   Marital status: Single    Spouse name: Not on file   Number of children: 0   Years of education: Bachelors    Highest education level: Not on file  Occupational History   Occupation: [employer] D[employer]   Tobacco Use   Smoking status: Never   Smokeless tobacco: Never  Vaping Use   Vaping  Use: Never used  Substance and Sexual Activity   Alcohol use: Yes    Comment: rare   Drug use: Never   Sexual activity: Yes    Birth control/protection: Pill  Other Topics Concern   Not on file  Social History Narrative   Right handed    Lives with boyfriend   Caffeine use: Soda daily ( 1  per day)   Social Determinants of Health   Financial Resource Strain: Not on file  Food Insecurity: Not on file  Transportation Needs: Not on file  Physical Activity: Not on file  Stress: Not on file  Social Connections: Not on file   Family History  Adopted: Yes       Review of Systems  Constitutional:  Negative for fever, malaise/fatigue and weight loss.  HENT:  Negative for congestion and sore throat.   Eyes:        Negative for visual changes  Respiratory:  Negative for cough and shortness of breath.   Cardiovascular:  Negative for chest pain, palpitations and leg swelling.  Gastrointestinal:  Negative for blood in stool, constipation, diarrhea and heartburn.  Genitourinary:  Negative for dysuria, frequency and urgency.  Musculoskeletal:  Negative for falls, joint pain and myalgias.  Skin:  Negative for rash.  Neurological:  Negative for dizziness, sensory change and headaches.  Endo/Heme/Allergies:  Does not bruise/bleed easily.  Psychiatric/Behavioral:  Positive for depression. Negative for hallucinations, memory loss, substance abuse and suicidal ideas. The patient is nervous/anxious.    Objective:   Vitals:   12/30/20 1325  BP: 128/86  Pulse: 88  Temp: 97.9 F (36.6 C)  SpO2: 99%    Body mass index is 33.89 kg/m.   Physical Examination:  Physical Exam Exam conducted with a chaperone present.  Constitutional:      General: She is not in acute distress.    Appearance: She is obese.  HENT:     Right Ear: Tympanic membrane, ear canal and external ear normal.     Left Ear: Tympanic membrane, ear canal and external ear normal.  Eyes:     General: No scleral  icterus.    Extraocular Movements: Extraocular movements intact.     Conjunctiva/sclera: Conjunctivae normal.  Cardiovascular:     Rate and Rhythm: Normal rate and regular rhythm.     Pulses: Normal pulses.     Heart sounds: Normal heart sounds.  Pulmonary:     Effort: Pulmonary effort is normal. No respiratory distress.     Breath sounds: Normal breath sounds.  Chest:  Breasts:    Breasts are symmetrical.     Right: Normal.     Left: Normal.  Abdominal:     General: Bowel sounds are normal. There is no distension.     Palpations: Abdomen is soft.     Hernia: There is no hernia in the left inguinal area or right inguinal area.  Genitourinary:    General: Normal vulva.     Exam position: Lithotomy position.     Labia:        Right: No rash, tenderness, lesion or injury.        Left: No rash, tenderness, lesion or injury.      Urethra: No prolapse.     Vagina: Normal. No vaginal discharge.     Cervix: Normal.     Uterus: Normal.      Adnexa: Right adnexa normal and left adnexa normal.  Musculoskeletal:        General: Normal range of motion.     Cervical back: Normal range of motion and neck supple.     Right lower leg: No edema.     Left lower leg: No edema.  Lymphadenopathy:     Cervical: No cervical adenopathy.     Upper Body:     Right upper body: No supraclavicular, axillary or pectoral adenopathy.  Left upper body: No supraclavicular, axillary or pectoral adenopathy.     Lower Body: No right inguinal adenopathy. No left inguinal adenopathy.  Skin:    General: Skin is warm and dry.  Neurological:     Mental Status: She is alert and oriented to person, place, and time.  Psychiatric:        Mood and Affect: Mood normal.        Behavior: Behavior normal.        Thought Content: Thought content normal.    ASSESSMENT and PLAN: This visit occurred during the SARS-CoV-2 public health emergency.  Safety protocols were in place, including screening questions prior to  the visit, additional usage of staff PPE, and extensive cleaning of exam room while observing appropriate contact time as indicated for disinfecting solutions.   Rhya was seen today for establish care.  Diagnoses and all orders for this visit:  Encounter for preventative adult health care exam with abnormal findings -     TSH; Future -     Cytology - PAP( [place])  Elevated liver enzymes -     US Abdomen Limited RUQ (LIVER/GB); Future  Postprandial epigastric pain -     US Abdomen Limited RUQ (LIVER/GB); Future  Other elevated white blood cell (WBC) count -     Pathologist smear review; Future -     Iron, TIBC and Ferritin Panel; Future -     B12; Future  Encounter for lipid screening for cardiovascular disease -     Lipid panel; Future  Encounter for Papanicolaou smear for cervical cancer screening -     Cytology - PAP( [place])  B12 deficiency -     B12; Future     Problem List Items Addressed This Visit       Other   B12 deficiency    Repeat B12 today Last check 2021, reports 3IM injections in past      Relevant Orders   B12   Elevated liver enzymes    Onset 2020, worse in last 1year Negative Hep. B and Hep. C Recent onset of postprandial ABD pain and diarrhea, triggered by all food type. No melena.  Hepatic Function Latest Ref Rng & Units 12/23/2020 08/03/2020 08/03/2020  Total Protein 6.0 - 8.5 g/dL 7.5 7.1 7.1  Albumin 3.9 - 5.0 g/dL 5.1(H) 4.7 4.5  AST 0 - 40 IU/L 84(H) 53(H) 48(H)  ALT 0 - 32 IU/L 134(H) 63(H) 67(H)  Alk Phosphatase 44 - 121 IU/L 86 81 85  Total Bilirubin 0.0 - 1.2 mg/dL 0.4 0.5 0.5  Bilirubin, Direct 0.00 - 0.40 mg/dL 0.11 0.15 0.12   Check ABD [place] Consider referral to GI is normal ABD [place]      Relevant Orders   US Abdomen Limited RUQ (LIVER/GB)   Leucocytosis    First noted in 2019 at 11.9 asymptomatic Check B12 and iron.  CBC Latest Ref Rng & Units 12/23/2020 07/14/2020 06/24/2020  WBC 3.4 - 10.8 x10E3/uL 15.1(H)  13.3(H) 16.0(H)  Hemoglobin 11.1 - 15.9 g/dL 15.0 14.3 14.6  Hematocrit 34.0 - 46.6 % 43.3 41.4 42.2  Platelets 150 - 450 x10E3/uL 364 369 341        Relevant Orders   Pathologist smear review   Iron, TIBC and Ferritin Panel   B12   Other Visit Diagnoses     Encounter for preventative adult health care exam with abnormal findings    -  Primary   Relevant Orders   TSH  Cytology - PAP( [place])   Postprandial epigastric pain       Relevant Orders   US Abdomen Limited RUQ (LIVER/GB)   Encounter for lipid screening for cardiovascular disease       Relevant Orders   Lipid panel   Encounter for Papanicolaou smear for cervical cancer screening       Relevant Orders   Cytology - PAP( [place])       Follow up: Return in about 6 months (around 06/29/2021) for CPE (fasting).  Wilfred Lacy, NP

## 2020-12-30 NOTE — Assessment & Plan Note (Addendum)
Repeat B12 today Last check 2021, reports 3IM injections in past

## 2020-12-31 LAB — STRATIFY JCV AB (W/ INDEX) W/ RFLX
Index Value: 0.08
Stratify JCV (TM) Ab w/Reflex Inhibition: NEGATIVE

## 2021-01-01 LAB — CYTOLOGY - PAP
Adequacy: ABSENT
Diagnosis: NEGATIVE

## 2021-01-09 ENCOUNTER — Other Ambulatory Visit: Payer: Self-pay | Admitting: Neurology

## 2021-01-18 ENCOUNTER — Ambulatory Visit
Admission: RE | Admit: 2021-01-18 | Discharge: 2021-01-18 | Disposition: A | Discharge status: Home / Self Care | Payer: 59 | Source: Ambulatory Visit | Attending: Nurse Practitioner | Admitting: Nurse Practitioner

## 2021-01-18 DIAGNOSIS — R1013 Epigastric pain: Secondary | ICD-10-CM

## 2021-01-18 DIAGNOSIS — R748 Abnormal levels of other serum enzymes: Secondary | ICD-10-CM

## 2021-01-20 ENCOUNTER — Encounter: Payer: Self-pay | Admitting: Nurse Practitioner

## 2021-01-20 DIAGNOSIS — R748 Abnormal levels of other serum enzymes: Secondary | ICD-10-CM

## 2021-01-20 DIAGNOSIS — K769 Liver disease, unspecified: Secondary | ICD-10-CM

## 2021-01-20 DIAGNOSIS — R1013 Epigastric pain: Secondary | ICD-10-CM

## 2021-01-20 NOTE — Addendum Note (Signed)
Addended by: Wilfred Lacy L on: 01/20/2021 03:25 PM   Modules accepted: Orders

## 2021-02-06 ENCOUNTER — Other Ambulatory Visit: Payer: Self-pay | Admitting: Neurology

## 2021-02-25 ENCOUNTER — Ambulatory Visit
Admission: RE | Admit: 2021-02-25 | Discharge: 2021-02-25 | Disposition: A | Discharge status: Home / Self Care | Payer: 59 | Source: Ambulatory Visit | Attending: Nurse Practitioner | Admitting: Nurse Practitioner

## 2021-02-25 DIAGNOSIS — R748 Abnormal levels of other serum enzymes: Secondary | ICD-10-CM

## 2021-02-25 DIAGNOSIS — R1013 Epigastric pain: Secondary | ICD-10-CM

## 2021-02-25 DIAGNOSIS — K769 Liver disease, unspecified: Secondary | ICD-10-CM

## 2021-02-25 MED ORDER — GADOBENATE DIMEGLUMINE 529 MG/ML IV SOLN
17.0000 mL | Freq: Once | INTRAVENOUS | Status: AC | PRN
  Administered 2021-02-25: 17 mL via INTRAVENOUS

## 2021-03-03 ENCOUNTER — Ambulatory Visit: Payer: 59 | Admitting: Nurse Practitioner

## 2021-03-05 ENCOUNTER — Other Ambulatory Visit: Payer: Self-pay | Admitting: Family Medicine

## 2021-03-05 NOTE — Telephone Encounter (Signed)
° °  Notes to clinic:  PCP not in this practice, please assess.      Requested Prescriptions  Pending Prescriptions Disp Refills   metoprolol succinate (TOPROL-XL) 50 MG 24 hr tablet [Pharmacy Med Name: METOPROLOL SUCC ER 50 MG TAB] 30 tablet 5    Sig: TAKE 1 TABLET (50 MG TOTAL) BY MOUTH AT BEDTIME. TAKE WITH OR IMMEDIATELY FOLLOWING A MEAL.     Cardiovascular:  Beta Blockers Failed - 03/05/2021  1:34 AM      Failed - Valid encounter within last 6 months    Recent Outpatient Visits           10 months ago Paroxysmal supraventricular tachycardia [facility])   [facility] [place], Wendee Beavers, [place]   1 year ago Annual physical exam   [facility] Carles Collet M, [place]   2 years ago Syncope, unspecified syncope type   [facility] [place], [place], [place]   2 years ago Sore throat   [facility] Carles Collet M, [place]   2 years ago Anxiety and depression   [place], Wendee Beavers, PA-C       Future Appointments             In 3 months [place], Charlene Brooke, NP [facility] [employer], [place] BP in normal range    BP Readings from Last 1 Encounters:  12/30/20 128/86          Passed - Last Heart Rate in normal range    Pulse Readings from Last 1 Encounters:  12/30/20 88

## 2021-03-29 ENCOUNTER — Telehealth: Payer: Self-pay | Admitting: Nurse Practitioner

## 2021-03-29 DIAGNOSIS — Z3040 Encounter for surveillance of contraceptives, unspecified: Secondary | ICD-10-CM

## 2021-03-29 MED ORDER — NORETHINDRONE 0.35 MG PO TABS: 1.0000 | ORAL_TABLET | Freq: Every day | ORAL | 11 refills | Status: DC

## 2021-03-29 NOTE — Telephone Encounter (Signed)
[facility] faxed refill request for the following medications:  norethindrone (CAMILA) 0.35 MG tablet   Please advise.

## 2021-04-18 ENCOUNTER — Encounter: Payer: Self-pay | Admitting: Cardiology

## 2021-04-19 MED ORDER — METOPROLOL SUCCINATE ER 50 MG PO TB24: 50.0000 mg | ORAL_TABLET | Freq: Every day | ORAL | 0 refills | Status: DC

## 2021-06-24 ENCOUNTER — Ambulatory Visit: Payer: 59 | Admitting: Neurology

## 2021-06-24 ENCOUNTER — Telehealth: Payer: Self-pay

## 2021-06-24 ENCOUNTER — Encounter: Payer: Self-pay | Admitting: Neurology

## 2021-06-24 VITALS — BP 132/79 | HR 92 | Ht 61.75 in | Wt 168.5 lb

## 2021-06-24 DIAGNOSIS — Z79899 Other long term (current) drug therapy: Secondary | ICD-10-CM | POA: Diagnosis not present

## 2021-06-24 DIAGNOSIS — G35 Multiple sclerosis: Secondary | ICD-10-CM

## 2021-06-24 DIAGNOSIS — E538 Deficiency of other specified B group vitamins: Secondary | ICD-10-CM

## 2021-06-24 DIAGNOSIS — R748 Abnormal levels of other serum enzymes: Secondary | ICD-10-CM

## 2021-06-24 DIAGNOSIS — E559 Vitamin D deficiency, unspecified: Secondary | ICD-10-CM | POA: Diagnosis not present

## 2021-06-24 NOTE — Telephone Encounter (Signed)
JCV collected and placed in [facility] box for pick up.  ?

## 2021-06-24 NOTE — Progress Notes (Signed)
? ?[facility] ? ?PATIENT: Janet Duran ?DOB: 12/11/1995 ? ?REFERRING DOCTOR OR PCP: Gurney Maxin, MD ?SOURCE: Patient, notes from Dr. Melrose Nakayama, imaging and lab reports, MRI images personally reviewed. ? ?_________________________________ ? ? ?HISTORICAL ? ?CHIEF COMPLAINT:  ?Chief Complaint  ?Patient presents with  ? Follow-up  ?  RM 1, alone. MS DMT: Tysabri.   ? ? ?HISTORY OF PRESENT ILLNESS:  ?Janet Duran is a 26 y.o. woman with relapsing remitting multiple sclerosis. ? ?Update 06/24/2021 ?She is on Tysabri and tolerates it well.  No exacerbations.  No new neurologic symptoms.   JCV Ab was 0.08 12/2020 ? ?Gait is unchanged with mild stumbling at times.  She can go downstairs without the rails on stairs but usually will hold on for safety.  She has sone intermittent leg and hand tingling that is not painful.    She denies weakness in her arms or legs but has some spasticity that has been sable since early in the disease course.    She notes urinary urgency improved with Detrol LA     When hot or tired some of the symptoms are worse.   ? ?She has fatigue, physical > cognitive     She sleeps well most nights -- 8 hours on average..    She notes depression and anxiety, not helped by a couple different med's    She notes mild brain fog and has had trouble coming up with the right word at times.    She works at [employer].    ? ?She is taking vitamin D supplements 50000 weekly.  Vitamin D was deficient at 15.3 and she took supplements for 6 months and then stopped.  Repeat vitamin D was 26.1 so she will be started..  She had three B12 injectons for deficiency (184) and is now taking pills.  This has not been rechecked. ? ?She has some migraine headache.   These have not bothered her too much.  She occasionally takes rizatriptan with benefit most of the time.  Frequency is lower since BP better controlled. ? ?LFTs were elevated.   Hepatitis labs were fine.   She had an U/S and  MRI and no pathology ? ? ?MS history: ?She was diagnosed with MS in March 2021 after presenting with fuzzy vision in both eyes and numbness in her right hand.   She had a migraine at the time.   Symptoms improved after 15 minutes.   She saw her PCP for suspected complicated migraine and was referred to Dr. Melrose Nakayama.   She had a brain MRI which was consistent with MS.   She also had a cervical spine MRI.   To confirm the diagnosis, she underwent an LP.  It showed oligoclonal bands confirming the diagnosis of MS.    She was started on Tysabri.   She is on Tysabri 300 mg every 6 weeks and tolerates it well.    She has not had not had any exacerbations.   Her next infusion is scheduled for 01/24/2020.   Her last JCV antibody was 0.10 (negative)  08/07/2019. ? ?IMAGING ?MRI of the brain 04/27/2019 showed multiple T2/FLAIR hyperintense foci in the hemispheres, many in the periventricular white matter radially oriented to the ventricles.  The foci was also present in the lateral left thalamus, right pons and right cerebellum.  None of the foci enhanced. ? ?MRI of the cervical spine showed abnormal T2 hyperintense foci to the left adjacent to C6 and centrally adjacent  to C4-C5.  The C6 focus enhanced after contrast. ? ?MRI 01/07/2020 showed T2/FLAIR hyperintense foci in the hemispheres, left thalamus and right cerebellar hemisphere in a pattern and configuration consistent with chronic demyelinating plaque associated with multiple sclerosis.  None of the foci appear to be acute.  They do not enhance.    There is a normal enhancement pattern. ? ?LABS ?Lab work from 05/02/2019 was reviewed.  She is varicella-zoster IgG positive, vitamin D deficient (15.3) Lyme antibody negative, QuantiFERON-TB negative, hep B core antibody negative, hep B surface antibody positive, hep C antibody negative,ANA negative, ACE negative JCV antibody was negative (0.15) ? ?JCV antibody 08/07/2019 was negative (0.10) ? 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 ? ?Family History: ?She is adopted.    ? ? ?REVIEW OF SYSTEMS: ?Constitutional: No fevers, chills, sweats, or change in appetite ?Eyes: No visual changes, double vision, eye pain ?Ear, nose and throat: No hearing loss, ear pain, nasal congestion, sore throat ?Cardiovascular: No chest pain, palpitations ?Respiratory:  No shortness of breath at rest or with exertion.   No wheezes ?GastrointestinaI: No nausea, vomiting, diarrhea, abdominal pain, fecal incontinence ?Genitourinary:  No dysuria, urinary retention or frequency.  No nocturia. ?Musculoskeletal:  No neck pain, back pain ?Integumentary: No rash, pruritus, skin lesions ?Neurological: as above ?Psychiatric: No depression at this time.  No anxiety ?Endocrine: No palpitations, diaphoresis, change in appetite, change in weigh or increased thirst ?Hematologic/Lymphatic:  No anemia, purpura, petechiae. ?Allergic/Immunologic: No itchy/runny eyes, nasal congestion, recent allergic reactions, rashes ? ?ALLERGIES: ?No Known Allergies ? ?HOME MEDICATIONS: ? ?Current Outpatient Medications:  ?  baclofen (LIORESAL) 10 MG tablet, Take 1 tablet (10 mg total) by mouth 3 (three) times daily., Disp: 90 each, Rfl: 11 ?  meloxicam (MOBIC) 7.5 MG tablet, Take 1 tablet (7.5 mg total) by mouth daily., Disp: 90 tablet, Rfl: 3 ?  metoprolol succinate (TOPROL-XL) 50 MG 24 hr tablet, Take 1 tablet (50 mg total) by mouth at bedtime. Take with or immediately following a meal. Please make yearly appt with Dr. Johney Frame for May 2023 for future refills. Thank you 1st attempt, Disp: 90 tablet, Rfl: 0 ?  natalizumab (TYSABRI) 300 MG/15ML injection, Inject 300 mg into the vein every 6 (six) weeks., Disp: , Rfl:  ?  norethindrone (CAMILA) 0.35 MG tablet, Take 1 tablet (0.35 mg total) by mouth daily., Disp: 28 tablet, Rfl: 11 ?  rizatriptan (MAXALT) 10 MG tablet, PLEASE SEE ATTACHED FOR DETAILED DIRECTIONS, Disp: , Rfl:  ?  tolterodine (DETROL LA) 4 MG 24 hr capsule,  TAKE 1 CAPSULE BY MOUTH EVERY DAY, Disp: 90 capsule, Rfl: 3 ?  Vitamin D, Ergocalciferol, (DRISDOL) 1.25 MG (50000 UNIT) CAPS capsule, TAKE 1 CAPSULE (50,000 UNITS TOTAL) BY MOUTH EVERY 7 (SEVEN) DAYS, Disp: 13 capsule, Rfl: 1 ? ?PAST MEDICAL HISTORY: ?Past Medical History:  ?Diagnosis Date  ? Anxiety   ? Depression   ? Ectopic beat   ? Ectopic heartbeat   ? Hypertension   ? Migraine   ? Multiple sclerosis ([place])   ? ? ?PAST SURGICAL HISTORY: ?Past Surgical History:  ?Procedure Laterality Date  ? NO PAST SURGERIES    ? ? ?FAMILY HISTORY: ?Family History  ?Adopted: Yes  ? ? ?SOCIAL HISTORY: ? ?Social History  ? ?Socioeconomic History  ? Marital status: Single  ?  Spouse name: Not on file  ? Number of children: 0  ? Years of education: Bachelors   ? Highest education level: Not on file  ?  Occupational History  ? Occupation: [employer] [place]   ?Tobacco Use  ? Smoking status: Never  ? Smokeless tobacco: Never  ?Vaping Use  ? Vaping Use: Never used  ?Substance and Sexual Activity  ? Alcohol use: Yes  ?  Comment: rare  ? Drug use: Never  ? Sexual activity: Yes  ?  Birth control/protection: Pill  ?Other Topics Concern  ? Not on file  ?Social History Narrative  ? Right handed   ? Lives with boyfriend  ? Caffeine use: Soda daily ( 1 per day)  ? ?Social Determinants of Health  ? ?Financial Resource Strain: Not on file  ?Food Insecurity: Not on file  ?Transportation Needs: Not on file  ?Physical Activity: Not on file  ?Stress: Not on file  ?Social Connections: Not on file  ?Intimate Partner Violence: Not on file  ? ? ? ?PHYSICAL EXAM ? ?Vitals:  ? 06/24/21 1258  ?BP: 132/79  ?Pulse: 92  ?Weight: 168 lb 8 oz (76.4 kg)  ?Height: 5' 1.75" (1.568 m)  ? ? ?Body mass index is 31.07 kg/m?. ? ?No results found. ? ?General: The patient is well-developed and well-nourished and in no acute distress ? ?HEENT:  Head is [place]/AT.  Sclera are anicteric.   ? ?Neck:  The neck is nontender. ?  ?Skin: Extremities are without rash or  edema. ?   ? ?Neurologic Exam ? ?Mental status: The patient is alert and oriented x 3 at the time of the examination. The patient has apparent normal recent and remote memory, with an apparently normal attention span

## 2021-06-25 ENCOUNTER — Other Ambulatory Visit: Payer: Self-pay | Admitting: Neurology

## 2021-06-25 LAB — CBC WITH DIFFERENTIAL/PLATELET
Basophils Absolute: 0.1 10*3/uL (ref 0.0–0.2)
Basos: 1 %
EOS (ABSOLUTE): 0.4 10*3/uL (ref 0.0–0.4)
Eos: 2 %
Hematocrit: 42 % (ref 34.0–46.6)
Hemoglobin: 14.8 g/dL (ref 11.1–15.9)
Immature Grans (Abs): 0.1 10*3/uL (ref 0.0–0.1)
Immature Granulocytes: 1 %
Lymphocytes Absolute: 8 10*3/uL — ABNORMAL HIGH (ref 0.7–3.1)
Lymphs: 49 %
MCH: 30.2 pg (ref 26.6–33.0)
MCHC: 35.2 g/dL (ref 31.5–35.7)
MCV: 86 fL (ref 79–97)
Monocytes Absolute: 1.3 10*3/uL — ABNORMAL HIGH (ref 0.1–0.9)
Monocytes: 8 %
Neutrophils Absolute: 6.3 10*3/uL (ref 1.4–7.0)
Neutrophils: 39 %
Platelets: 406 10*3/uL (ref 150–450)
RBC: 4.9 x10E6/uL (ref 3.77–5.28)
RDW: 13.1 % (ref 11.7–15.4)
WBC: 16.3 10*3/uL — ABNORMAL HIGH (ref 3.4–10.8)

## 2021-06-25 LAB — VITAMIN B12: Vitamin B-12: 247 pg/mL (ref 232–1245)

## 2021-06-25 LAB — VITAMIN D 25 HYDROXY (VIT D DEFICIENCY, FRACTURES): Vit D, 25-Hydroxy: 19.8 ng/mL — ABNORMAL LOW (ref 30.0–100.0)

## 2021-06-25 MED ORDER — VITAMIN D (ERGOCALCIFEROL) 1.25 MG (50000 UNIT) PO CAPS: 50000.0000 [IU] | ORAL_CAPSULE | ORAL | 1 refills | Status: DC

## 2021-06-30 ENCOUNTER — Ambulatory Visit: Payer: 59 | Admitting: Nurse Practitioner

## 2021-06-30 NOTE — Telephone Encounter (Signed)
JCV collected on 06/24/21 ?Index value: 0.10 ?JCV: antibody Negative ?

## 2021-07-01 ENCOUNTER — Ambulatory Visit: Payer: 59 | Admitting: Nurse Practitioner

## 2021-07-01 ENCOUNTER — Telehealth: Payer: Self-pay | Admitting: Nurse Practitioner

## 2021-07-01 NOTE — Telephone Encounter (Signed)
Pt was a no show for her appointment on 07/01/21 with Baldo Ash for an [facility]. I sent a no show letter.  ?

## 2021-07-05 ENCOUNTER — Ambulatory Visit (INDEPENDENT_AMBULATORY_CARE_PROVIDER_SITE_OTHER): Payer: 59 | Admitting: Nurse Practitioner

## 2021-07-05 ENCOUNTER — Encounter: Payer: Self-pay | Admitting: Nurse Practitioner

## 2021-07-05 VITALS — BP 116/76 | HR 80 | Temp 97.4°F | Ht 62.0 in | Wt 165.4 lb

## 2021-07-05 DIAGNOSIS — K7689 Other specified diseases of liver: Secondary | ICD-10-CM

## 2021-07-05 DIAGNOSIS — R739 Hyperglycemia, unspecified: Secondary | ICD-10-CM | POA: Diagnosis not present

## 2021-07-05 DIAGNOSIS — D72829 Elevated white blood cell count, unspecified: Secondary | ICD-10-CM | POA: Diagnosis not present

## 2021-07-05 DIAGNOSIS — R7401 Elevation of levels of liver transaminase levels: Secondary | ICD-10-CM

## 2021-07-05 LAB — TSH: TSH: 1.2 u[IU]/mL (ref 0.35–5.50)

## 2021-07-05 NOTE — Assessment & Plan Note (Addendum)
Denies any GI symptoms, no jaundice. Repeat hepatic panel: resolved    Latest Ref Rng & Units 07/05/2021    2:10 PM 12/23/2020    1:27 PM 08/03/2020    1:05 PM  Hepatic Function  Total Protein 6.0 - 8.3 g/dL 7.5   7.5   7.1     7.1    Albumin 3.5 - 5.2 g/dL 4.8   5.1   4.5     4.7    AST 0 - 37 U/L 20   84   48     53    ALT 0 - 35 U/L 29   134   67     63    Alk Phosphatase 39 - 117 U/L 60   86   85     81    Total Bilirubin 0.2 - 1.2 mg/dL 0.8   0.4   0.5     0.5    Bilirubin, Direct 0.00 - 0.40 mg/dL  0.11   0.12     0.15

## 2021-07-05 NOTE — Assessment & Plan Note (Signed)
MRI ABD 02/2021: 57m lesion in R. Lobe. Possible hemangioma. recommended to repeat in 645month ?Entered order for repaeat MRI ABD w/without. ?

## 2021-07-05 NOTE — Patient Instructions (Signed)
Go to lab for blood draw ?You will be contacted to schedule appt for repeat MRI ABD. ? ?

## 2021-07-05 NOTE — Progress Notes (Signed)
Established Patient Visit  Patient: Janet Duran   DOB: [birth year]   26 y.o. Female  MRN: [id number] Visit Date: 07/09/2021  Subjective:    Chief Complaint  Patient presents with   Follow-up    Pt fasting F/up on LFTs.   HPI Elevated liver transaminase level Denies any GI symptoms, no jaundice. Repeat hepatic panel: resolved    Latest Ref Rng & Units 07/05/2021    2:10 PM 12/23/2020    1:27 PM 08/03/2020    1:05 PM  Hepatic Function  Total Protein 6.0 - 8.3 g/dL 7.5   7.5   7.1     7.1    Albumin 3.5 - 5.2 g/dL 4.8   5.1   4.5     4.7    AST 0 - 37 U/L 20   84   48     53    ALT 0 - 35 U/L 29   134   67     63    Alk Phosphatase 39 - 117 U/L 60   86   85     81    Total Bilirubin 0.2 - 1.2 mg/dL 0.8   0.4   0.5     0.5    Bilirubin, Direct 0.00 - 0.40 mg/dL  0.11   0.12     0.15      Liver nodule MRI ABD 02/2021: 26m lesion in R. Lobe. Possible hemangioma. recommended to repeat in 639month Entered order for repaeat MRI ABD w/without.  Leucocytosis Persistent elevated WBX in last 3y57yrlowest at 11 and highest at 16 Normal B12 No swollen lymph nodes, no fever, no night sweats. Possibly due to medication: Tysabri? Check iron, TSH, and pathologist smear  Reports she has implemented Diet modification and daily exercise for weight loss Wt Readings from Last 3 Encounters:  07/05/21 165 lb 6.4 oz (75 kg)  06/24/21 168 lb 8 oz (76.4 kg)  12/30/20 183 lb 12.8 oz (83.4 kg)    Reviewed medical, surgical, and social history today  Medications: Outpatient Medications Prior to Visit  Medication Sig   baclofen (LIORESAL) 10 MG tablet Take 1 tablet (10 mg total) by mouth 3 (three) times daily.   meloxicam (MOBIC) 7.5 MG tablet Take 1 tablet (7.5 mg total) by mouth daily.   metoprolol succinate (TOPROL-XL) 50 MG 24 hr tablet Take 1 tablet (50 mg total) by mouth at bedtime. Take with or immediately following a meal. Please make yearly appt with Dr.  PemJohney Framer May 2023 for future refills. Thank you 1st attempt   natalizumab (TYSABRI) 300 MG/15ML injection Inject 300 mg into the vein every 6 (six) weeks.   norethindrone (CAMILA) 0.35 MG tablet Take 1 tablet (0.35 mg total) by mouth daily.   rizatriptan (MAXALT) 10 MG tablet PLEASE SEE ATTACHED FOR DETAILED DIRECTIONS   tolterodine (DETROL LA) 4 MG 24 hr capsule TAKE 1 CAPSULE BY MOUTH EVERY DAY   Vitamin D, Ergocalciferol, (DRISDOL) 1.25 MG (50000 UNIT) CAPS capsule Take 1 capsule (50,000 Units total) by mouth every 7 (seven) days.   No facility-administered medications prior to visit.   Reviewed past medical and social history.   ROS per HPI above  Last CBC Lab Results  Component Value Date   WBC 16.3 (H) 06/24/2021   HGB 14.8 06/24/2021   HCT 42.0 06/24/2021   MCV 86 06/24/2021   MCH 30.2 06/24/2021  RDW 13.1 06/24/2021   PLT 406 63/33/5456   Last metabolic panel Lab Results  Component Value Date   GLUCOSE 87 07/05/2021   NA 140 07/05/2021   K 3.9 07/05/2021   CL 104 07/05/2021   CO2 24 07/05/2021   BUN 9 07/05/2021   CREATININE 0.57 07/05/2021   EGFR 130 06/10/2020   CALCIUM 10.0 07/05/2021   PROT 7.5 07/05/2021   ALBUMIN 4.8 07/05/2021   LABGLOB 2.6 06/10/2020   AGRATIO 1.8 06/10/2020   BILITOT 0.8 07/05/2021   ALKPHOS 60 07/05/2021   AST 20 07/05/2021   ALT 29 07/05/2021   Last vitamin B12 and Folate Lab Results  Component Value Date   VITAMINB12 247 06/24/2021      Objective:  BP 116/76 (BP Location: Left Arm, Patient Position: Sitting, Cuff Size: Small)   Pulse 80   Temp (!) 97.4 F (36.3 C) (Temporal)   Ht _0  (1.575 m)   Wt 165 lb 6.4 oz (75 kg)   SpO2 97%   BMI 30.25 kg/m      Physical Exam  Results for orders placed or performed in visit on 07/05/21  TSH  Result Value Ref Range   TSH 1.20 0.35 - 5.50 uIU/mL  Iron, TIBC and Ferritin Panel  Result Value Ref Range   Iron 134 40 - 190 mcg/dL   TIBC 418 250 - 450 mcg/dL (calc)    %SAT 32 16 - 45 % (calc)   Ferritin 31 16 - 154 ng/mL  Pathologist smear review  Result Value Ref Range   Path Review    Lipid panel  Result Value Ref Range   Cholesterol 236 (H) 0 - 200 mg/dL   Triglycerides 192.0 (H) 0.0 - 149.0 mg/dL   HDL 36.80 (L) >39.00 mg/dL   VLDL 38.4 0.0 - 40.0 mg/dL   LDL Cholesterol 160 (H) 0 - 99 mg/dL   Total CHOL/HDL Ratio 6    NonHDL 198.70   Hemoglobin A1c  Result Value Ref Range   Hgb A1c MFr Bld 5.7 4.6 - 6.5 %  Comprehensive metabolic panel  Result Value Ref Range   Sodium 140 135 - 145 mEq/L   Potassium 3.9 3.5 - 5.1 mEq/L   Chloride 104 96 - 112 mEq/L   CO2 24 19 - 32 mEq/L   Glucose, Bld 87 70 - 99 mg/dL   BUN 9 6 - 23 mg/dL   Creatinine, Ser 0.57 0.40 - 1.20 mg/dL   Total Bilirubin 0.8 0.2 - 1.2 mg/dL   Alkaline Phosphatase 60 39 - 117 U/L   AST 20 0 - 37 U/L   ALT 29 0 - 35 U/L   Total Protein 7.5 6.0 - 8.3 g/dL   Albumin 4.8 3.5 - 5.2 g/dL   GFR 125.84 >60.00 mL/min   Calcium 10.0 8.4 - 10.5 mg/dL      Assessment & Plan:    Problem List Items Addressed This Visit       Digestive   Liver nodule    MRI ABD 02/2021: 61m lesion in R. Lobe. Possible hemangioma. recommended to repeat in 635month Entered order for repaeat MRI ABD w/without.       Relevant Orders   MR Abdomen W Wo Contrast   Comprehensive metabolic panel (Completed)     Other   Elevated liver transaminase level    Denies any GI symptoms, no jaundice. Repeat hepatic panel: resolved    Latest Ref Rng & Units 07/05/2021    2:10 PM 12/23/2020  1:27 PM 08/03/2020    1:05 PM  Hepatic Function  Total Protein 6.0 - 8.3 g/dL 7.5   7.5   7.1     7.1    Albumin 3.5 - 5.2 g/dL 4.8   5.1   4.5     4.7    AST 0 - 37 U/L 20   84   48     53    ALT 0 - 35 U/L 29   134   67     63    Alk Phosphatase 39 - 117 U/L 60   86   85     81    Total Bilirubin 0.2 - 1.2 mg/dL 0.8   0.4   0.5     0.5    Bilirubin, Direct 0.00 - 0.40 mg/dL  0.11   0.12     0.15           Relevant Orders   TSH (Completed)   Lipid panel (Completed)   Comprehensive metabolic panel (Completed)   Leucocytosis - Primary    Persistent elevated WBX in last 5yr: lowest at 11 and highest at 16 Normal B12 No swollen lymph nodes, no fever, no night sweats. Possibly due to medication: Tysabri? Check iron, TSH, and pathologist smear       Relevant Orders   Iron, TIBC and Ferritin Panel (Completed)   Pathologist smear review (Completed)   Comprehensive metabolic panel (Completed)   Other Visit Diagnoses     Hyperglycemia       Relevant Orders   Hemoglobin A1c (Completed)   Comprehensive metabolic panel (Completed)      Return in about 6 months (around 01/05/2022) for CPE (fasting).     CWilfred Lacy NP

## 2021-07-05 NOTE — Assessment & Plan Note (Addendum)
Persistent elevated WBX in last 88yr: lowest at 11 and highest at 16 ?Normal B12 ?No swollen lymph nodes, no fever, no night sweats. ?Possibly due to medication: Tysabri? ?Check iron, TSH, and pathologist smear ? ?

## 2021-07-06 LAB — LIPID PANEL
Cholesterol: 236 mg/dL — ABNORMAL HIGH (ref 0–200)
HDL: 36.8 mg/dL — ABNORMAL LOW (ref >39.00)
LDL Cholesterol: 160 mg/dL — ABNORMAL HIGH (ref 0–99)
NonHDL: 198.7
Total CHOL/HDL Ratio: 6
Triglycerides: 192 mg/dL — ABNORMAL HIGH (ref 0.0–149.0)
VLDL: 38.4 mg/dL (ref 0.0–40.0)

## 2021-07-06 LAB — COMPREHENSIVE METABOLIC PANEL
ALT: 29 U/L (ref 0–35)
AST: 20 U/L (ref 0–37)
Albumin: 4.8 g/dL (ref 3.5–5.2)
Alkaline Phosphatase: 60 U/L (ref 39–117)
BUN: 9 mg/dL (ref 6–23)
CO2: 24 mEq/L (ref 19–32)
Calcium: 10 mg/dL (ref 8.4–10.5)
Chloride: 104 mEq/L (ref 96–112)
Creatinine, Ser: 0.57 mg/dL (ref 0.40–1.20)
GFR: 125.84 mL/min (ref >60.00)
Glucose, Bld: 87 mg/dL (ref 70–99)
Potassium: 3.9 mEq/L (ref 3.5–5.1)
Sodium: 140 mEq/L (ref 135–145)
Total Bilirubin: 0.8 mg/dL (ref 0.2–1.2)
Total Protein: 7.5 g/dL (ref 6.0–8.3)

## 2021-07-06 LAB — IRON,TIBC AND FERRITIN PANEL
%SAT: 32 % (calc) (ref 16–45)
Ferritin: 31 ng/mL (ref 16–154)
Iron: 134 ug/dL (ref 40–190)
TIBC: 418 mcg/dL (calc) (ref 250–450)

## 2021-07-06 LAB — PATHOLOGIST SMEAR REVIEW

## 2021-07-06 LAB — HEMOGLOBIN A1C: Hgb A1c MFr Bld: 5.7 % (ref 4.6–6.5)

## 2021-07-08 ENCOUNTER — Telehealth: Payer: Self-pay | Admitting: Neurology

## 2021-07-08 NOTE — Telephone Encounter (Signed)
60 mins MR brain wo & MR cervical wo Dr. Felecia Shelling [facility] [name]: [id number] exp. 06/28/21-08/12/21 scheduled at [facility] 07/20/21 at 9am

## 2021-07-20 ENCOUNTER — Ambulatory Visit: Payer: 59

## 2021-07-20 DIAGNOSIS — G35 Multiple sclerosis: Secondary | ICD-10-CM

## 2021-07-20 DIAGNOSIS — Z79899 Other long term (current) drug therapy: Secondary | ICD-10-CM

## 2021-07-20 NOTE — Telephone Encounter (Signed)
1st no show, fee waived ?

## 2021-07-27 ENCOUNTER — Other Ambulatory Visit: Payer: Self-pay | Admitting: *Deleted

## 2021-07-27 MED ORDER — METOPROLOL SUCCINATE ER 50 MG PO TB24: 50.0000 mg | ORAL_TABLET | Freq: Every day | ORAL | 0 refills | Status: DC

## 2021-08-02 ENCOUNTER — Ambulatory Visit
Admission: RE | Admit: 2021-08-02 | Discharge: 2021-08-02 | Disposition: A | Discharge status: Home / Self Care | Payer: 59 | Source: Ambulatory Visit | Attending: Nurse Practitioner | Admitting: Nurse Practitioner

## 2021-08-02 DIAGNOSIS — K7689 Other specified diseases of liver: Secondary | ICD-10-CM

## 2021-08-02 MED ORDER — GADOBENATE DIMEGLUMINE 529 MG/ML IV SOLN
16.0000 mL | Freq: Once | INTRAVENOUS | Status: AC | PRN
  Administered 2021-08-02: 16 mL via INTRAVENOUS

## 2021-08-30 ENCOUNTER — Other Ambulatory Visit: Payer: Self-pay

## 2021-08-30 MED ORDER — METOPROLOL SUCCINATE ER 50 MG PO TB24: 50.0000 mg | ORAL_TABLET | Freq: Every day | ORAL | 0 refills | Status: DC

## 2021-09-17 ENCOUNTER — Other Ambulatory Visit: Payer: Self-pay

## 2021-09-27 ENCOUNTER — Encounter: Payer: Self-pay | Admitting: Nurse Practitioner

## 2021-09-27 ENCOUNTER — Ambulatory Visit (INDEPENDENT_AMBULATORY_CARE_PROVIDER_SITE_OTHER): Payer: 59 | Admitting: Nurse Practitioner

## 2021-09-27 VITALS — BP 129/73 | Temp 96.9°F | Wt 177.0 lb

## 2021-09-27 DIAGNOSIS — R110 Nausea: Secondary | ICD-10-CM

## 2021-09-27 DIAGNOSIS — Z3A01 Less than 8 weeks gestation of pregnancy: Secondary | ICD-10-CM

## 2021-09-27 HISTORY — DX: Less than 8 weeks gestation of pregnancy: Z3A.01

## 2021-09-27 LAB — POCT URINE PREGNANCY: Preg Test, Ur: POSITIVE — AB

## 2021-09-27 MED ORDER — PROMETHAZINE HCL 12.5 MG PO TABS: 12.5000 mg | ORAL_TABLET | Freq: Three times a day (TID) | ORAL | 0 refills | Status: DC | PRN

## 2021-09-27 NOTE — Assessment & Plan Note (Signed)
Positive pregnancy test at home and in office. Last menstrual period was 08/14/21 which makes her approximately [redacted] weeks pregnant. Reviewed medication list and told her to stop taking her meloxicam and baclofen. She can take tylenol as needed for pain. Start a prenatal vitamin daily. Referral placed to OB/GYN.

## 2021-09-27 NOTE — Patient Instructions (Signed)
It was great to see you!  Stop taking meloxicam and baclofen. I have placed a referral to OB/GYN.   Let's follow-up with any concerns.   If a referral was placed today, you will be contacted for an appointment. Please note that routine referrals can sometimes take up to 3-4 weeks to process. Please call our office if you haven't heard anything after this time frame.  Take care,  Vance Peper, NP  Safe Medications in Pregnancy   Acne: Benzoyl Peroxide Salicylic Acid  Backache/Headache: Tylenol: 2 regular strength every 4 hours OR              2 Extra strength every 6 hours  Colds/Coughs/Allergies: Benadryl (alcohol free) 25 mg every 6 hours as needed Breath right strips Claritin Cepacol throat lozenges Chloraseptic throat spray Cold-Eeze- up to three times per day Cough drops, alcohol free Flonase (by prescription only) Guaifenesin Mucinex Robitussin DM (plain only, alcohol free) Saline nasal spray/drops Sudafed (pseudoephedrine) & Actifed ** use only after [redacted] weeks gestation and if you do not have high blood pressure Tylenol Vicks Vaporub Zinc lozenges Zyrtec   Constipation: Colace Ducolax suppositories Fleet enema Glycerin suppositories Metamucil Milk of magnesia Miralax Senokot Smooth move tea  Diarrhea: Kaopectate Imodium A-D  *NO pepto Bismol  Hemorrhoids: Anusol Anusol HC Preparation H Tucks  Indigestion: Tums Maalox Mylanta Zantac  Pepcid  Insomnia: Benadryl (alcohol free) '25mg'$  every 6 hours as needed Tylenol PM Unisom, no Gelcaps  Leg Cramps: Tums MagGel  Nausea/Vomiting:  Bonine Dramamine Emetrol Ginger extract Sea bands Meclizine  Nausea medication to take during pregnancy:  Unisom (doxylamine succinate 25 mg tablets) Take one tablet daily at bedtime. If symptoms are not adequately controlled, the dose can be increased to a maximum recommended dose of two tablets daily (1/2 tablet in the morning, 1/2 tablet  mid-afternoon and one at bedtime). Vitamin B6 '100mg'$  tablets. Take one tablet twice a day (up to 200 mg per day).  Skin Rashes: Aveeno products Benadryl cream or '25mg'$  every 6 hours as needed Calamine Lotion 1% cortisone cream  Yeast infection: Gyne-lotrimin 7 Monistat 7   **If taking multiple medications, please check labels to avoid duplicating the same active ingredients **take medication as directed on the label ** Do not exceed 4000 mg of tylenol in 24 hours **Do not take medications that contain aspirin or ibuprofen

## 2021-09-27 NOTE — Progress Notes (Signed)
   Acute Office Visit  Subjective:     Patient ID: Janet Duran, female    DOB: 04-05-1995, 26 y.o.   MRN: [id number]  Chief Complaint  Patient presents with   Acute Visit    Pt states there is a possibility of pregnancy due to missed period, feeling nauseous for several days and 3 at-home pregnancy tests were positive.     HPI Patient is in today for positive home pregnancy test. She states her last menstrual period was June 24. She is experiencing some nausea and fatigue. This was not a planned pregnancy, she was taking norethindrone daily.   ROS See pertinent positives and negatives per HPI.     Objective:    BP 129/73   Temp (!) 96.9 F (36.1 C) (Temporal)   Wt 177 lb (80.3 kg)   LMP 09/13/2021 (Approximate)   SpO2 98%   BMI 32.37 kg/m    Physical Exam Vitals and nursing note reviewed.  Constitutional:      General: She is not in acute distress.    Appearance: Normal appearance.  HENT:     Head: Normocephalic.  Eyes:     Conjunctiva/sclera: Conjunctivae normal.  Cardiovascular:     Rate and Rhythm: Normal rate and regular rhythm.     Pulses: Normal pulses.     Heart sounds: Normal heart sounds.  Pulmonary:     Effort: Pulmonary effort is normal.     Breath sounds: Normal breath sounds.  Musculoskeletal:     Cervical back: Normal range of motion.  Skin:    General: Skin is warm.  Neurological:     General: No focal deficit present.     Mental Status: She is alert and oriented to person, place, and time.  Psychiatric:        Mood and Affect: Mood normal.        Behavior: Behavior normal.        Thought Content: Thought content normal.        Judgment: Judgment normal.     Results for orders placed or performed in visit on 09/27/21  POCT urine pregnancy  Result Value Ref Range   Preg Test, Ur Positive (A) Negative      Assessment & Plan:   Problem List Items Addressed This Visit       Other   Less than [redacted] weeks gestation of pregnancy -  Primary    Positive pregnancy test at home and in office. Last menstrual period was 08/14/21 which makes her approximately [redacted] weeks pregnant. Reviewed medication list and told her to stop taking her meloxicam and baclofen. She can take tylenol as needed for pain. Start a prenatal vitamin daily. Referral placed to OB/GYN.       Relevant Orders   Ambulatory referral to Obstetrics / Gynecology   Other Visit Diagnoses     Nausea       Positive pregnancy test. She states zofran does not normally help. Will send in promethazine 12.'5mg'$  prn nausea.    Relevant Orders   POCT urine pregnancy (Completed)       Meds ordered this encounter  Medications   promethazine (PHENERGAN) 12.5 MG tablet    Sig: Take 1 tablet (12.5 mg total) by mouth every 8 (eight) hours as needed for nausea or vomiting.    Dispense:  30 tablet    Refill:  0    Return if symptoms worsen or fail to improve.  Charyl Dancer, NP

## 2021-09-28 ENCOUNTER — Encounter: Payer: Self-pay | Admitting: General Practice

## 2021-09-29 ENCOUNTER — Encounter: Payer: Self-pay | Admitting: Neurology

## 2021-10-04 ENCOUNTER — Ambulatory Visit (INDEPENDENT_AMBULATORY_CARE_PROVIDER_SITE_OTHER): Payer: 59 | Admitting: Neurology

## 2021-10-04 ENCOUNTER — Telehealth: Payer: Self-pay

## 2021-10-04 ENCOUNTER — Encounter: Payer: Self-pay | Admitting: Neurology

## 2021-10-04 VITALS — BP 146/92 | HR 120 | Ht 62.0 in | Wt 177.5 lb

## 2021-10-04 DIAGNOSIS — G43109 Migraine with aura, not intractable, without status migrainosus: Secondary | ICD-10-CM | POA: Diagnosis not present

## 2021-10-04 DIAGNOSIS — G35 Multiple sclerosis: Secondary | ICD-10-CM | POA: Diagnosis not present

## 2021-10-04 DIAGNOSIS — Z79899 Other long term (current) drug therapy: Secondary | ICD-10-CM | POA: Diagnosis not present

## 2021-10-04 DIAGNOSIS — I10 Essential (primary) hypertension: Secondary | ICD-10-CM | POA: Diagnosis not present

## 2021-10-04 MED ORDER — GLATIRAMER ACETATE 40 MG/ML ~~LOC~~ SOSY: PREFILLED_SYRINGE | SUBCUTANEOUS | 11 refills | Status: DC

## 2021-10-04 MED ORDER — LABETALOL HCL 100 MG PO TABS: 100.0000 mg | ORAL_TABLET | Freq: Two times a day (BID) | ORAL | 9 refills | Status: DC

## 2021-10-04 NOTE — Telephone Encounter (Signed)
Received glatiramer start form from Dr. Felecia Shelling. Faxed to [facility]. Received a receipt of confirmation.  Initiated PA for glatiramer on CMM. Sent to [facility]. Key: [id number]. Should have a determination within 3-5 business days.

## 2021-10-04 NOTE — Progress Notes (Signed)
[facility]  PATIENT: Janet Duran DOB: February 14, 1996  REFERRING DOCTOR OR PCP: Gurney Maxin, MD SOURCE: Patient, notes from Dr. Melrose Nakayama, imaging and lab reports, MRI images personally reviewed.  _________________________________   HISTORICAL  CHIEF COMPLAINT:  Chief Complaint  Patient presents with   Follow-up    Rm 2, alone. Here to discuss other DMT options for MS, pt currently [redacted] weeks pregnant. Pt has stopped all medication for the last 2 weeks.     HISTORY OF PRESENT ILLNESS:  Janet Duran is a 26 y.o. woman with relapsing remitting multiple sclerosis.  Update 10/04/2021 She is currently [redacted] weeks pregnant.  She has an appointment to see OB/GYN but has not seen them yet.  She has self discontinued her medications.  She was on Tysabri and tolerated it well.  She had no exacerbations while on it.  She had her last Tysabri 09/08/21.   No exacerbations.  No new neurologic symptoms.   JCV Ab was 0.10 06/24/2021  Gait is unchanged.  She has no falls but will stumble at times.  She does not need to use the rail on the stairs but often will use it anyhow for safety.  She has had some intermittent leg and hand tingling.  No painful dysesthesias.  She denies weakness in her arms or legs but has some spasticity that has been sable since early in the disease course.    She notes urinary urgency improved with Detrol LA     When hot or tired some of the symptoms are worse.    She has fatigue, physical > cognitive     She sleeps well most nights -- 8 hours on average..    She notes depression and anxiety, not helped by a couple different med's    She notes mild brain fog and has had trouble coming up with the right word at times.    She works at [employer].     She is taking vitamin D supplements 50000 weekly.  Vitamin D was deficient at 15.3 and she took supplements for 6 months and then stopped.  Repeat vitamin D was 26.1 so she will be started..  She had  three B12 injectons for deficiency (184) and is now taking pills.  This has not been rechecked.  She has some migraine headache.   These have not bothered her too much.  She occasionally takes rizatriptan with benefit most of the time.  Frequency is lower since BP better controlled.  LFTs were elevated.   Hepatitis labs were fine.   She had an U/S and MRI and no pathology   MS history: She was diagnosed with MS in March 2021 after presenting with fuzzy vision in both eyes and numbness in her right hand.   She had a migraine at the time.   Symptoms improved after 15 minutes.   She saw her PCP for suspected complicated migraine and was referred to Dr. Melrose Nakayama.   She had a brain MRI which was consistent with MS.   She also had a cervical spine MRI.   To confirm the diagnosis, she underwent an LP.  It showed oligoclonal bands confirming the diagnosis of MS.    She was started on Tysabri.   She is on Tysabri 300 mg every 6 weeks and tolerates it well.    She has not had not had any exacerbations.   Her next infusion is scheduled for 01/24/2020.   Her last JCV antibody was 0.10 (  negative)  08/07/2019.  IMAGING MRI of the brain 04/27/2019 showed multiple T2/FLAIR hyperintense foci in the hemispheres, many in the periventricular white matter radially oriented to the ventricles.  The foci was also present in the lateral left thalamus, right pons and right cerebellum.  None of the foci enhanced.  MRI of the cervical spine showed abnormal T2 hyperintense foci to the left adjacent to C6 and centrally adjacent to C4-C5.  The C6 focus enhanced after contrast.  MRI 01/07/2020 showed T2/FLAIR hyperintense foci in the hemispheres, left thalamus and right cerebellar hemisphere in a pattern and configuration consistent with chronic demyelinating plaque associated with multiple sclerosis.  None of the foci appear to be acute.  They do not enhance.    There is a normal enhancement pattern.  LABS Lab work from 05/02/2019 was  reviewed.  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  Family History: She is adopted.      REVIEW OF SYSTEMS: Constitutional: No fevers, chills, sweats, or change in appetite Eyes: No visual changes, double vision, eye pain Ear, nose and throat: No hearing loss, ear pain, nasal congestion, sore throat Cardiovascular: No chest pain, palpitations Respiratory:  No shortness of breath at rest or with exertion.   No wheezes GastrointestinaI: No nausea, vomiting, diarrhea, abdominal pain, fecal incontinence Genitourinary:  No dysuria, urinary retention or frequency.  No nocturia. Musculoskeletal:  No neck pain, back pain Integumentary: No rash, pruritus, skin lesions Neurological: as above Psychiatric: No depression at this time.  No anxiety Endocrine: No palpitations, diaphoresis, change in appetite, change in weigh or increased thirst Hematologic/Lymphatic:  No anemia, purpura, petechiae. Allergic/Immunologic: No itchy/runny eyes, nasal congestion, recent allergic reactions, rashes  ALLERGIES: No Known Allergies  HOME MEDICATIONS:  Current Outpatient Medications:    Glatiramer Acetate 40 MG/ML SOSY, 40 mg subc three times a week., Disp: 11.76 mL, Rfl: 11   labetalol (NORMODYNE) 100 MG tablet, Take 1 tablet (100 mg total) by mouth 2 (two) times daily., Disp: 60 tablet, Rfl: 9  PAST MEDICAL HISTORY: Past Medical History:  Diagnosis Date   Anxiety    Depression    Ectopic beat    Ectopic heartbeat    Hypertension    Migraine    Multiple sclerosis ([place])     PAST SURGICAL HISTORY: Past Surgical History:  Procedure Laterality Date   NO PAST SURGERIES       FAMILY HISTORY: Family History  Adopted: Yes    SOCIAL HISTORY:  Social History   Socioeconomic History   Marital status: Single    Spouse name: Not on file   Number of children: 0   Years of education: Bachelors    Highest education level: Not on file  Occupational History   Occupation: [employer]   Tobacco Use   Smoking status: Never   Smokeless tobacco: Never  Vaping Use   Vaping Use: Never used  Substance and Sexual Activity   Alcohol use: Yes    Comment: rare   Drug use: Never   Sexual activity: Yes    Birth control/protection: Pill  Other Topics Concern   Not on file  Social History Narrative   Right handed    Lives with boyfriend   Caffeine use: [place]  daily ( 1 per day)   Social Determinants of Health   Financial Resource Strain: Not on file  Food Insecurity: Not on file  Transportation Needs: Not on file  Physical Activity: Not on file  Stress: Not on file  Social Connections: Not on file  Intimate Partner Violence: Not on file     PHYSICAL EXAM  Vitals:   10/04/21 1432  BP: (!) 146/92  Pulse: (!) 120  Weight: 177 lb 8 oz (80.5 kg)  Height: '5\' 2"'$  (1.575 m)    Body mass index is 32.47 kg/m.  No results found.  General: The patient is well-developed and well-nourished and in no acute distress  HEENT:  Head is [place]/AT.  Sclera are anicteric.    Neck:  The neck is nontender.   Skin: Extremities are without rash or  edema.    Neurologic Exam  Mental status: The patient is alert and oriented x 3 at the time of the examination. The patient has apparent normal recent and remote memory, with an apparently normal attention span and concentration ability.   Speech is normal.  Cranial nerves: Extraocular movements are full. Facial strength and sensation are normal.  No obvious hearing deficits are noted.  Motor:  Muscle bulk is normal.   Tone is normal. Strength is  5 / 5 in all 4 extremities.   Sensory: Sensory testing is intact  to pinprick, soft touch and vibration sensation in all 4 extremities.  Coordination: Cerebellar testing reveals good finger-nose-finger and heel-to-shin bilaterally.  Gait and station: Station is normal.   Gait was normal.  Tandem gait is minimally wide.  Romberg is negative.  Reflexes: Deep tendon reflexes are symmetric and normal bilaterally.       DIAGNOSTIC DATA (LABS, IMAGING, TESTING) - I reviewed patient records, labs, notes, testing and imaging myself where available.  Lab Results  Component Value Date   WBC 16.3 (H) 06/24/2021   HGB 14.8 06/24/2021   HCT 42.0 06/24/2021   MCV 86 06/24/2021   PLT 406 06/24/2021      Component Value Date/Time   NA 140 07/05/2021 1410   NA 138 06/10/2020 1210   K 3.9 07/05/2021 1410   CL 104 07/05/2021 1410   CO2 24 07/05/2021 1410   GLUCOSE 87 07/05/2021 1410   BUN 9 07/05/2021 1410   BUN 8 06/10/2020 1210   CREATININE 0.57 07/05/2021 1410   CALCIUM 10.0 07/05/2021 1410   PROT 7.5 07/05/2021 1410   PROT 7.5 12/23/2020 1327   ALBUMIN 4.8 07/05/2021 1410   ALBUMIN 5.1 (H) 12/23/2020 1327   AST 20 07/05/2021 1410   ALT 29 07/05/2021 1410   ALKPHOS 60 07/05/2021 1410   BILITOT 0.8 07/05/2021 1410   BILITOT 0.4 12/23/2020 1327   GFRNONAA 128 02/01/2019 1543   GFRAA 147 02/01/2019 1543   Lab Results  Component Value Date   CHOL 236 (H) 07/05/2021   HDL 36.80 (L) 07/05/2021   LDLCALC 160 (H) 07/05/2021   TRIG 192.0 (H) 07/05/2021   CHOLHDL 6 07/05/2021    Lab Results  Component Value Date   TSH 1.20 07/05/2021       ASSESSMENT AND PLAN  Multiple sclerosis (HCC)  High risk medication use  Migraine with aura and without status migrainosus, not intractable  Benign hypertension    1.   We discussed stopping the Tysabri due to the pregnancy.  Her MS is likely average aggressiveness as she has at least 1 spinal cord plaque.  We discussed  options.  Because she is not low aggressiveness, I feel the best course of  action would be for her to switch from Tysabri to Copaxone during the pregnancy.  Afterwards, she can switch back..  2.   She had been on a beta-blocker for hypertension.  Due to the pregnancy, and since she has hypertension with untreated, I switched her to labetalol 100 mg p.o. twice daily.  This appears to be safer during pregnancy.   3.   Stay active 4.    rtc 6 months  Kaelin Bonelli A. Felecia Shelling, MD, [facility] 8/40/3353, 3:17 PM Certified in Neurology, Clinical Neurophysiology, Sleep Medicine and Neuroimaging  [facility] [address] [place], [place] [address] [phone]

## 2021-10-05 NOTE — Telephone Encounter (Signed)
PA for glatiramer was approved by [employer]. "Request Reference Number: [id number]. GLATIRAMER INJ '40MG'$ /ML is approved through 10/05/2022. Your patient may now fill this prescription and it will be covered."

## 2021-10-11 NOTE — Telephone Encounter (Signed)
I called patient to advise her of this. No answer, left a message asking her to call us back. If patient calls back please pass along this message.  I will also send the patient a mychart message.

## 2021-10-11 NOTE — Telephone Encounter (Signed)
[employer]anch [employer] @ [employer]) is asking that a Therapist, sports reach out to pt to make her aware they are trying to reach out to her re: the enrollment form.  Blanch Media states she can be reached at [phone]

## 2021-10-18 ENCOUNTER — Encounter: Payer: Self-pay | Admitting: Family Medicine

## 2021-10-19 ENCOUNTER — Encounter: Payer: Self-pay | Admitting: General Practice

## 2021-10-19 ENCOUNTER — Other Ambulatory Visit: Payer: 59

## 2021-10-19 DIAGNOSIS — O039 Complete or unspecified spontaneous abortion without complication: Secondary | ICD-10-CM

## 2021-10-19 NOTE — Progress Notes (Signed)
Patient sent message to provider stating she might be expierencing miscarriage. Patient is having cramping and bleeding. Patient sent to lab. Anderson Malta [facility]

## 2021-10-20 ENCOUNTER — Telehealth: Payer: Self-pay

## 2021-10-20 LAB — CBC
Hematocrit: 38.1 % (ref 34.0–46.6)
Hemoglobin: 13.4 g/dL (ref 11.1–15.9)
MCH: 30.1 pg (ref 26.6–33.0)
MCHC: 35.2 g/dL (ref 31.5–35.7)
MCV: 86 fL (ref 79–97)
Platelets: 363 10*3/uL (ref 150–450)
RBC: 4.45 x10E6/uL (ref 3.77–5.28)
RDW: 12.6 % (ref 11.7–15.4)
WBC: 14.6 10*3/uL — ABNORMAL HIGH (ref 3.4–10.8)

## 2021-10-20 LAB — ABO AND RH: Rh Factor: POSITIVE

## 2021-10-20 LAB — BETA HCG QUANT (REF LAB): hCG Quant: 3147 m[IU]/mL

## 2021-10-20 NOTE — Telephone Encounter (Signed)
Called patient to inform her that per Dr. Nehemiah Settle, her hormone level looks ok and to schedule her to come in to repeat her HCG. Pt is scheduled for lab on 10/21/21.Understanding was voiced. Maude Gloor l Brodin Gelpi, CMA

## 2021-10-20 NOTE — Telephone Encounter (Signed)
-----   Message from Truett Mainland, DO sent at 10/20/2021  3:48 PM EDT ----- Her hormone level looks okay - can we have her return tomorrow for another HCG?

## 2021-10-21 ENCOUNTER — Ambulatory Visit: Payer: 59

## 2021-10-21 DIAGNOSIS — O039 Complete or unspecified spontaneous abortion without complication: Secondary | ICD-10-CM

## 2021-10-21 NOTE — Progress Notes (Signed)
Patient sent to lab for follow up HCG

## 2021-10-22 ENCOUNTER — Encounter: Payer: Self-pay | Admitting: Neurology

## 2021-10-22 LAB — BETA HCG QUANT (REF LAB): hCG Quant: 1413 m[IU]/mL

## 2021-10-22 NOTE — Progress Notes (Signed)
Hormone levels have dropped. I called patient and talked with her about her experience - did have a lot of cramping and passed tissue and blood clots. Will recheck hCG in 1 week - is out of town next week - will be back the following week. Will check it then.

## 2021-10-26 NOTE — Telephone Encounter (Signed)
Gave print out infusion suite to get pt scheduled for Tysabri infusion.

## 2021-10-26 NOTE — Telephone Encounter (Signed)
Patient will be resuming tysabri.

## 2021-11-03 ENCOUNTER — Ambulatory Visit: Payer: 59

## 2021-11-04 ENCOUNTER — Ambulatory Visit: Payer: 59

## 2021-11-04 DIAGNOSIS — O039 Complete or unspecified spontaneous abortion without complication: Secondary | ICD-10-CM

## 2021-11-04 NOTE — Progress Notes (Signed)
Patient sent to lab for follow up HCG. Kathrene Alu RN

## 2021-11-05 LAB — BETA HCG QUANT (REF LAB): hCG Quant: 15 m[IU]/mL

## 2021-11-08 ENCOUNTER — Other Ambulatory Visit (INDEPENDENT_AMBULATORY_CARE_PROVIDER_SITE_OTHER): Payer: Self-pay

## 2021-11-08 ENCOUNTER — Other Ambulatory Visit: Payer: Self-pay | Admitting: *Deleted

## 2021-11-08 DIAGNOSIS — G35 Multiple sclerosis: Secondary | ICD-10-CM

## 2021-11-08 DIAGNOSIS — Z79899 Other long term (current) drug therapy: Secondary | ICD-10-CM

## 2021-11-08 DIAGNOSIS — Z0289 Encounter for other administrative examinations: Secondary | ICD-10-CM

## 2021-11-08 NOTE — Progress Notes (Signed)
Placed JCV lab in [facility] lock box for routine lab pick up. Results pending. 

## 2021-11-09 LAB — CBC WITH DIFFERENTIAL/PLATELET
Basophils Absolute: 0.1 10*3/uL (ref 0.0–0.2)
Basos: 1 %
EOS (ABSOLUTE): 0.4 10*3/uL (ref 0.0–0.4)
Eos: 3 %
Hematocrit: 42.9 % (ref 34.0–46.6)
Hemoglobin: 14.2 g/dL (ref 11.1–15.9)
Immature Grans (Abs): 0 10*3/uL (ref 0.0–0.1)
Immature Granulocytes: 0 %
Lymphocytes Absolute: 4.6 10*3/uL — ABNORMAL HIGH (ref 0.7–3.1)
Lymphs: 36 %
MCH: 28.9 pg (ref 26.6–33.0)
MCHC: 33.1 g/dL (ref 31.5–35.7)
MCV: 87 fL (ref 79–97)
Monocytes Absolute: 0.8 10*3/uL (ref 0.1–0.9)
Monocytes: 6 %
Neutrophils Absolute: 6.8 10*3/uL (ref 1.4–7.0)
Neutrophils: 54 %
Platelets: 388 10*3/uL (ref 150–450)
RBC: 4.91 x10E6/uL (ref 3.77–5.28)
RDW: 13.2 % (ref 11.7–15.4)
WBC: 12.7 10*3/uL — ABNORMAL HIGH (ref 3.4–10.8)

## 2021-11-11 ENCOUNTER — Ambulatory Visit: Payer: 59 | Admitting: Family Medicine

## 2021-11-15 ENCOUNTER — Telehealth: Payer: Self-pay | Admitting: *Deleted

## 2021-11-15 NOTE — Telephone Encounter (Signed)
JCV ab drawn 11/08/21 negative, index: 0.10.

## 2021-11-17 ENCOUNTER — Encounter: Payer: Self-pay | Admitting: Family Medicine

## 2021-11-17 ENCOUNTER — Ambulatory Visit (INDEPENDENT_AMBULATORY_CARE_PROVIDER_SITE_OTHER): Payer: 59 | Admitting: Family Medicine

## 2021-11-17 VITALS — BP 126/72 | HR 101 | Ht 62.0 in | Wt 179.0 lb

## 2021-11-17 DIAGNOSIS — O039 Complete or unspecified spontaneous abortion without complication: Secondary | ICD-10-CM | POA: Diagnosis not present

## 2021-11-17 NOTE — Progress Notes (Signed)
Patient is not having anymore bleeding.  Kathrene Alu RN

## 2021-11-17 NOTE — Progress Notes (Signed)
   Subjective:    Patient ID: Janet Duran, female    DOB: 1995-03-29, 26 y.o.   MRN: [id number]  HPI Patient seen for follow-up of miscarriage. She stopped bleeding a few days after her conversation.  She has not had any bleeding since then. This was not a planned pregnancy. She is not wanting to get pregnant soon.   Review of Systems     Objective:   Physical Exam Vitals reviewed.  Constitutional:      Appearance: Normal appearance.  Cardiovascular:     Rate and Rhythm: Normal rate and regular rhythm.  Pulmonary:     Effort: Pulmonary effort is normal.     Breath sounds: Normal breath sounds.  Skin:    Capillary Refill: Capillary refill takes less than 2 seconds.  Neurological:     General: No focal deficit present.     Mental Status: She is alert.  Psychiatric:        Mood and Affect: Mood normal.        Behavior: Behavior normal.        Assessment & Plan:  1. Miscarriage Completed. Discussed birth control - was on micronor, but not consistently taking it. Discussed consistency with POP. Gets PAP with PCP. POPs from PCP. Wishes to continue getting those things at this point.

## 2021-11-23 ENCOUNTER — Ambulatory Visit (INDEPENDENT_AMBULATORY_CARE_PROVIDER_SITE_OTHER): Payer: 59 | Admitting: Nurse Practitioner

## 2021-11-23 ENCOUNTER — Encounter: Payer: Self-pay | Admitting: Nurse Practitioner

## 2021-11-23 VITALS — BP 122/90 | HR 113 | Temp 97.2°F | Wt 177.0 lb

## 2021-11-23 DIAGNOSIS — J209 Acute bronchitis, unspecified: Secondary | ICD-10-CM

## 2021-11-23 DIAGNOSIS — H66001 Acute suppurative otitis media without spontaneous rupture of ear drum, right ear: Secondary | ICD-10-CM | POA: Diagnosis not present

## 2021-11-23 LAB — POCT INFLUENZA A/B
Influenza A, POC: NEGATIVE
Influenza B, POC: NEGATIVE

## 2021-11-23 LAB — POC COVID19 BINAXNOW: SARS Coronavirus 2 Ag: NEGATIVE

## 2021-11-23 MED ORDER — ALBUTEROL SULFATE HFA 108 (90 BASE) MCG/ACT IN AERS
1.0000 | INHALATION_SPRAY | Freq: Four times a day (QID) | RESPIRATORY_TRACT | 0 refills | Status: DC | PRN
Start: 2021-11-23 — End: 2023-03-16

## 2021-11-23 MED ORDER — BENZONATATE 100 MG PO CAPS: 100.0000 mg | ORAL_CAPSULE | Freq: Three times a day (TID) | ORAL | 0 refills | Status: DC | PRN

## 2021-11-23 MED ORDER — AMOXICILLIN-POT CLAVULANATE 875-125 MG PO TABS: 1.0000 | ORAL_TABLET | Freq: Two times a day (BID) | ORAL | 0 refills | Status: DC

## 2021-11-23 NOTE — Progress Notes (Signed)
Established Patient Visit  Patient: Janet Duran   DOB: 1995-08-16   26 y.o. Female  MRN: [id number] Visit Date: 11/23/2021  Subjective:    Chief Complaint  Patient presents with   Acute Visit    Cough, Congested,soar Throat. Pressure in Rt ear. Covid test done last week(neg) No vaccine.   Sinus Problem This is a new problem. The current episode started 1 to 4 weeks ago. The problem is unchanged. There has been no fever. The pain is moderate. Associated symptoms include chills, congestion, coughing, ear pain, headaches, shortness of breath, sinus pressure and a sore throat. Pertinent negatives include no diaphoresis, hoarse voice, neck pain, sneezing or swollen glands. Past treatments include oral decongestants. The treatment provided mild relief.   Reviewed medical, surgical, and social history today  Medications: Outpatient Medications Prior to Visit  Medication Sig   natalizumab (TYSABRI) 300 MG/15ML injection Inject into the vein.   [DISCONTINUED] Glatiramer Acetate 40 MG/ML SOSY 40 mg subc three times a week. (Patient not taking: Reported on 11/23/2021)   [DISCONTINUED] labetalol (NORMODYNE) 100 MG tablet Take 1 tablet (100 mg total) by mouth 2 (two) times daily. (Patient not taking: Reported on 11/23/2021)   No facility-administered medications prior to visit.   Reviewed past medical and social history.   ROS per HPI above      Objective:  BP (!) 122/90 (BP Location: Right Arm, Patient Position: Sitting, Cuff Size: Normal)   Pulse (!) 113   Temp (!) 97.2 F (36.2 C) (Temporal)   Wt 177 lb (80.3 kg)   SpO2 98%   BMI 32.37 kg/m      Physical Exam Nursing note reviewed.  Constitutional:      General: She is not in acute distress. HENT:     Head:     Jaw: There is normal jaw occlusion.     Salivary Glands: Right salivary gland is not diffusely enlarged or tender. Left salivary gland is not diffusely enlarged or tender.     Right Ear:  Decreased hearing noted. Swelling and tenderness present. No drainage. A middle ear effusion is present. There is no impacted cerumen. No mastoid tenderness. Tympanic membrane is injected and erythematous. Tympanic membrane is not scarred, perforated, retracted or bulging.     Left Ear: Hearing, tympanic membrane, ear canal and external ear normal. There is no impacted cerumen.     Mouth/Throat:     Pharynx: Uvula midline. Posterior oropharyngeal erythema present. No pharyngeal swelling, oropharyngeal exudate or uvula swelling.     Tonsils: No tonsillar exudate.  Cardiovascular:     Rate and Rhythm: Normal rate and regular rhythm.     Pulses: Normal pulses.     Heart sounds: Normal heart sounds.  Pulmonary:     Effort: Pulmonary effort is normal.     Breath sounds: Normal breath sounds.  Neurological:     Mental Status: She is alert and oriented to person, place, and time.     Results for orders placed or performed in visit on 11/23/21  POC COVID-19  Result Value Ref Range   SARS Coronavirus 2 Ag Negative Negative  POCT Influenza A/B  Result Value Ref Range   Influenza A, POC Negative Negative   Influenza B, POC Negative Negative      Assessment & Plan:    Problem List Items Addressed This Visit   None Visit Diagnoses     Non-recurrent acute  suppurative otitis media of right ear without spontaneous rupture of tympanic membrane    -  Primary   Relevant Medications   amoxicillin-clavulanate (AUGMENTIN) 875-125 MG tablet   Acute bronchitis, unspecified organism       Relevant Medications   benzonatate (TESSALON) 100 MG capsule   albuterol (VENTOLIN HFA) 108 (90 Base) MCG/ACT inhaler   Other Relevant Orders   POC COVID-19 (Completed)   POCT Influenza A/B (Completed)      Return if symptoms worsen or fail to improve.     Wilfred Lacy, NP

## 2021-11-23 NOTE — Patient Instructions (Addendum)
Negative Flu and COVID test Sent augmentin, albuterol, and benzonatate Start OTC zyrtec '10mg'$  daily, and mucinex '600mg'$  BID Stop oral decongestant Maintain adequate oral hydration. Call office if no improvement in 1week.

## 2022-01-03 ENCOUNTER — Encounter: Payer: Self-pay | Admitting: Nurse Practitioner

## 2022-01-03 ENCOUNTER — Ambulatory Visit (INDEPENDENT_AMBULATORY_CARE_PROVIDER_SITE_OTHER): Payer: 59 | Admitting: Nurse Practitioner

## 2022-01-03 VITALS — BP 122/84 | HR 102 | Temp 97.1°F | Ht 62.0 in | Wt 182.2 lb

## 2022-01-03 DIAGNOSIS — R739 Hyperglycemia, unspecified: Secondary | ICD-10-CM

## 2022-01-03 DIAGNOSIS — F331 Major depressive disorder, recurrent, moderate: Secondary | ICD-10-CM

## 2022-01-03 DIAGNOSIS — E559 Vitamin D deficiency, unspecified: Secondary | ICD-10-CM | POA: Diagnosis not present

## 2022-01-03 DIAGNOSIS — E538 Deficiency of other specified B group vitamins: Secondary | ICD-10-CM

## 2022-01-03 DIAGNOSIS — E7800 Pure hypercholesterolemia, unspecified: Secondary | ICD-10-CM | POA: Diagnosis not present

## 2022-01-03 DIAGNOSIS — Z0001 Encounter for general adult medical examination with abnormal findings: Secondary | ICD-10-CM

## 2022-01-03 LAB — LIPID PANEL
Cholesterol: 247 mg/dL — ABNORMAL HIGH (ref 0–200)
HDL: 40.7 mg/dL (ref >39.00)
NonHDL: 205.84
Total CHOL/HDL Ratio: 6
Triglycerides: 224 mg/dL — ABNORMAL HIGH (ref 0.0–149.0)
VLDL: 44.8 mg/dL — ABNORMAL HIGH (ref 0.0–40.0)

## 2022-01-03 LAB — VITAMIN D 25 HYDROXY (VIT D DEFICIENCY, FRACTURES): VITD: 15.55 ng/mL — ABNORMAL LOW (ref 30.00–100.00)

## 2022-01-03 LAB — HEMOGLOBIN A1C: Hgb A1c MFr Bld: 6.1 % (ref 4.6–6.5)

## 2022-01-03 LAB — LDL CHOLESTEROL, DIRECT: Direct LDL: 186 mg/dL

## 2022-01-03 LAB — VITAMIN B12: Vitamin B-12: 159 pg/mL — ABNORMAL LOW (ref 211–911)

## 2022-01-03 MED ORDER — ESCITALOPRAM OXALATE 10 MG PO TABS: 10.0000 mg | ORAL_TABLET | Freq: Every day | ORAL | 5 refills | Status: DC

## 2022-01-03 NOTE — Progress Notes (Signed)
Complete physical exam  Patient: Janet Duran   DOB: 15-Mar-1995   26 y.o. Female  MRN: [id number] Visit Date: 01/03/2022  Subjective:    Chief Complaint  Patient presents with   Annual Exam    CPE Pt fasting  No concerns     Janet Duran is a 26 y.o. female who presents today for a complete physical exam. She reports consuming a general diet.  No consistent exercise regimen  She generally feels fairly well. She reports sleeping fairly well. She does have additional problems to discuss today.  Vision:No Dental:Yes STD Screen:Yes  Most recent fall risk assessment:    07/05/2021    1:43 PM  [place] in the past year? 0  Number falls in past yr: 0  Injury with Fall? 0   Most recent depression screenings:    01/03/2022    9:37 AM 07/05/2021    2:01 PM  PHQ 2/9 Scores  PHQ - 2 Score 6 4  PHQ- 9 Score 21 17      01/03/2022    9:37 AM 07/05/2021    2:02 PM 12/30/2020    1:56 PM 04/25/2018    8:49 AM  GAD 7 : Generalized Anxiety Score  Nervous, Anxious, on Edge _0 Control/stop worrying _1 Worry too much - different things _2 Trouble relaxing _3 Restless _4 Easily annoyed or irritable _5 Afraid - awful might happen 2 0 1 2  Total GAD 7 Score _6 Anxiety Difficulty Extremely difficult Not difficult at all Somewhat difficult Extremely difficult   HPI  Depression Chronic, waxing and waning, worse in last 49month due to work and home. Reports miscarriage 26monthago. No SI/HI/hallucination/ previous IVC. Unknown Fhx-adopted. Previous use of wellbutrin and prozac-ineffective.  Agreed to start lexepro 1038mnd psychology referral. F/up in 20mo72monthtamin D deficiency Repeat vit. D   Past Medical History:  Diagnosis Date   Anxiety    Depression    Ectopic beat    Ectopic heartbeat    Hypertension    Migraine    Multiple sclerosis (HCC)    Past Surgical History:  Procedure Laterality Date    NO PAST SURGERIES     Social History   Socioeconomic History   Marital status: Single    Spouse name: Not on file   Number of children: 0   Years of education: Bachelors    Highest education level: Not on file  Occupational History   Occupation: [employer]   Tobacco Use   Smoking status: Never   Smokeless tobacco: Never  Vaping Use   Vaping Use: Never used  Substance and Sexual Activity   Alcohol use: Yes    Comment: rare   Drug use: Never   Sexual activity: Yes    Birth control/protection: Pill  Other Topics Concern   Not on file  Social History Narrative   Right handed    Lives with boyfriend   Caffeine use: Soda daily ( 1 per day)   Social Determinants of Health   Financial Resource Strain: Not on file  Food Insecurity: Not on file  Transportation Needs: Not on file  Physical Activity: Not on file  Stress: Not on file  Social Connections: Not on file  Intimate Partner Violence: Not on file   Family Status  Relation  Name Status   Mother  Alive   Father  Alive   Family History  Adopted: Yes   No Known Allergies  Patient Care Team: Brennen Camper, Charlene Brooke, NP as PCP - General (Internal Medicine)   Medications: Outpatient Medications Prior to Visit  Medication Sig   albuterol (VENTOLIN HFA) 108 (90 Base) MCG/ACT inhaler Inhale 1-2 puffs into the lungs every 6 (six) hours as needed for wheezing or shortness of breath.   natalizumab (TYSABRI) 300 MG/15ML injection Inject into the vein.   norethindrone (MICRONOR) 0.35 MG tablet Take 1 tablet by mouth daily.   tolterodine (DETROL LA) 4 MG 24 hr capsule Take 4 mg by mouth daily.   Vitamin D, Ergocalciferol, (DRISDOL) 1.25 MG (50000 UNIT) CAPS capsule Take 50,000 Units by mouth every 7 (seven) days.   [DISCONTINUED] amoxicillin-clavulanate (AUGMENTIN) 875-125 MG tablet Take 1 tablet by mouth 2 (two) times daily. (Patient not taking: Reported on 01/03/2022)   [DISCONTINUED] benzonatate (TESSALON) 100 MG  capsule Take 1-2 capsules (100-200 mg total) by mouth 3 (three) times daily as needed. (Patient not taking: Reported on 01/03/2022)   No facility-administered medications prior to visit.    Review of Systems  Constitutional:  Negative for fever.  HENT:  Negative for congestion and sore throat.   Eyes:        Negative for visual changes  Respiratory:  Negative for cough and shortness of breath.   Cardiovascular:  Negative for chest pain, palpitations and leg swelling.  Gastrointestinal:  Negative for blood in stool, constipation and diarrhea.  Genitourinary:  Negative for dysuria, frequency and urgency.  Musculoskeletal:  Negative for myalgias.  Skin:  Negative for rash.  Neurological:  Negative for dizziness and headaches.  Hematological:  Does not bruise/bleed easily.  Psychiatric/Behavioral:  Positive for dysphoric mood and sleep disturbance. Negative for behavioral problems, hallucinations and suicidal ideas. The patient is nervous/anxious. The patient is not hyperactive.     Last CBC Lab Results  Component Value Date   WBC 12.7 (H) 11/08/2021   HGB 14.2 11/08/2021   HCT 42.9 11/08/2021   MCV 87 11/08/2021   MCH 28.9 11/08/2021   RDW 13.2 11/08/2021   PLT 388 44/02/270   Last metabolic panel Lab Results  Component Value Date   GLUCOSE 87 07/05/2021   NA 140 07/05/2021   K 3.9 07/05/2021   CL 104 07/05/2021   CO2 24 07/05/2021   BUN 9 07/05/2021   CREATININE 0.57 07/05/2021   EGFR 130 06/10/2020   CALCIUM 10.0 07/05/2021   PROT 7.5 07/05/2021   ALBUMIN 4.8 07/05/2021   LABGLOB 2.6 06/10/2020   AGRATIO 1.8 06/10/2020   BILITOT 0.8 07/05/2021   ALKPHOS 60 07/05/2021   AST 20 07/05/2021   ALT 29 07/05/2021   Last thyroid functions Lab Results  Component Value Date   TSH 1.20 07/05/2021        Objective:  BP 122/84 (BP Location: Right Arm, Patient Position: Sitting, Cuff Size: Normal)   Pulse (!) 102   Temp (!) 97.1 F (36.2 C) (Temporal)   Ht _0   (1.575 m)   Wt 182 lb 3.2 oz (82.6 kg)   SpO2 98%   BMI 33.32 kg/m     BP Readings from Last 3 Encounters:  01/03/22 122/84  11/23/21 (!) 122/90  11/17/21 126/72   Wt Readings from Last 3 Encounters:  01/03/22 182 lb 3.2 oz (82.6 kg)  11/23/21 177 lb (80.3 kg)  11/17/21 179 lb (81.2 kg)  Physical Exam Vitals and nursing note reviewed.  Constitutional:      General: She is not in acute distress. HENT:     Right Ear: Hearing, ear canal and external ear normal. A middle ear effusion is present. There is no impacted cerumen. No mastoid tenderness. Tympanic membrane is not injected, scarred, perforated, erythematous or retracted.     Left Ear: Hearing, ear canal and external ear normal. A middle ear effusion is present. There is no impacted cerumen. No mastoid tenderness. Tympanic membrane is retracted. Tympanic membrane is not injected, scarred, perforated or erythematous.     Nose: Nose normal.     Mouth/Throat:     Pharynx: No oropharyngeal exudate.  Eyes:     General: No scleral icterus.    Conjunctiva/sclera: Conjunctivae normal.     Pupils: Pupils are equal, round, and reactive to light.  Neck:     Thyroid: No thyroid mass, thyromegaly or thyroid tenderness.  Cardiovascular:     Rate and Rhythm: Normal rate and regular rhythm.     Pulses: Normal pulses.     Heart sounds: Normal heart sounds.  Pulmonary:     Effort: Pulmonary effort is normal.     Breath sounds: Normal breath sounds.  Chest:     Chest wall: No tenderness.  Breasts:    Breasts are symmetrical.     Right: Normal.     Left: Normal.  Abdominal:     General: Bowel sounds are normal. There is no distension.     Palpations: Abdomen is soft.     Tenderness: There is no abdominal tenderness.  Musculoskeletal:        General: No tenderness. Normal range of motion.     Cervical back: Normal range of motion and neck supple.     Right lower leg: No edema.     Left lower leg: No edema.  Lymphadenopathy:      Cervical: No cervical adenopathy.     Upper Body:     Right upper body: No supraclavicular, axillary or pectoral adenopathy.     Left upper body: No supraclavicular, axillary or pectoral adenopathy.  Skin:    General: Skin is warm and dry.  Neurological:     Mental Status: She is alert and oriented to person, place, and time.  Psychiatric:        Behavior: Behavior normal.        Thought Content: Thought content normal.        Judgment: Judgment normal.      No results found for any visits on 01/03/22.    Assessment & Plan:    Routine Health Maintenance and Physical Exam  Immunization History  Administered Date(s) Administered   DTaP 10/03/1995, 02/12/1996, 05/22/1996, 12/09/1998   HIB (PRP-OMP) 10/03/1995, 02/12/1996, 05/22/1996, 12/09/1998   Hepatitis A 07/30/2007, 09/22/2009   Hepatitis B 04-20-1995, 08/29/1995, 02/12/1996   Hpv-Unspecified 07/30/2007, 09/22/2009, 07/23/2013   IPV 10/03/1995, 11/30/1995, 12/09/1998, 06/27/2006   MMR 12/09/1998, 06/27/2006   Meningococcal Conjugate 03/16/2007, 07/23/2013   Td 06/27/2006, 07/23/2013   Tdap 07/23/2013    Health Maintenance  Topic Date Due   COVID-19 Vaccine (1) 01/19/2022 (Originally 07/24/2000)   INFLUENZA VACCINE  05/22/2022 (Originally 09/21/2021)   TETANUS/TDAP  07/24/2023   PAP-Cervical Cytology Screening  12/31/2023   PAP SMEAR-Modifier  12/31/2023   HPV VACCINES  Completed   Hepatitis C Screening  Completed   HIV Screening  Completed   Discussed health benefits of physical activity, and encouraged her to engage in  regular exercise appropriate for her age and condition.  Problem List Items Addressed This Visit       Other   B12 deficiency   Relevant Orders   B12   Depression    Chronic, waxing and waning, worse in last 67month due to work and home. Reports miscarriage 26monthago. No SI/HI/hallucination/ previous IVC. Unknown Fhx-adopted. Previous use of wellbutrin and prozac-ineffective.  Agreed to  start lexepro 1053mnd psychology referral. F/up in 17mo37month  Relevant Medications   escitalopram (LEXAPRO) 10 MG tablet   Other Relevant Orders   Ambulatory referral to Psychology   Vitamin D deficiency    Repeat vit. D      Relevant Orders   Vitamin D (25 hydroxy)   Other Visit Diagnoses     Encounter for preventative adult health care exam with abnormal findings    -  Primary   Pure hypercholesterolemia       Relevant Orders   Lipid panel   Hyperglycemia       Relevant Orders   Hemoglobin A1c      Return in about 4 weeks (around 01/31/2022) for depression and anxiety.     CharWilfred Lacy

## 2022-01-03 NOTE — Addendum Note (Signed)
Addended by: Leana Gamer on: 01/03/2022 03:42 PM   Modules accepted: Orders

## 2022-01-03 NOTE — Assessment & Plan Note (Signed)
Repeat vit. D °

## 2022-01-03 NOTE — Patient Instructions (Addendum)
Start zyrtec 4m and flonase 2sprays in each nostril once a day x 14days.  Start 1/2 tablet once daily for 1 week and then increase to a full tablet once daily continuously  We discussed common side effects such as nausea, drowsiness and weight gain.  Also discussed rare but serious side effect of suicide ideation.  She is instructed to discontinue medication go directly to ED if this occurs.  Pt verbalizes understanding.   Plan follow up in 1 month to evaluate progress.    Start daily exercise and maintain heart healthy diet  Go to lab  Preventive Care [id number] Old, Female Preventive care refers to lifestyle choices and visits with your health care provider that can promote health and wellness. Preventive care visits are also called wellness exams. What can I expect for my preventive care visit? Counseling During your preventive care visit, your health care provider may ask about your: Medical history, including: Past medical problems. Family medical history. Pregnancy history. Current health, including: Menstrual cycle. Method of birth control. Emotional well-being. Home life and relationship well-being. Sexual activity and sexual health. Lifestyle, including: Alcohol, nicotine or tobacco, and drug use. Access to firearms. Diet, exercise, and sleep habits. Work and work [employer] use. Safety issues such as seatbelt and bike helmet use. Physical exam Your health care provider may check your: Height and weight. These may be used to calculate your BMI (body mass index). BMI is a measurement that tells if you are at a healthy weight. Waist circumference. This measures the distance around your waistline. This measurement also tells if you are at a healthy weight and may help predict your risk of certain diseases, such as type 2 diabetes and high blood pressure. Heart rate and blood pressure. Body temperature. Skin for abnormal spots. What immunizations do I  need?  Vaccines are usually given at various ages, according to a schedule. Your health care provider will recommend vaccines for you based on your age, medical history, and lifestyle or other factors, such as travel or where you work. What tests do I need? Screening Your health care provider may recommend screening tests for certain conditions. This may include: Pelvic exam and Pap test. Lipid and cholesterol levels. Diabetes screening. This is done by checking your blood sugar (glucose) after you have not eaten for a while (fasting). Hepatitis B test. Hepatitis C test. HIV (human immunodeficiency virus) test. STI (sexually transmitted infection) testing, if you are at risk. BRCA-related cancer screening. This may be done if you have a family history of breast, ovarian, tubal, or peritoneal cancers. Talk with your health care provider about your test results, treatment options, and if necessary, the need for more tests. Follow these instructions at home: Eating and drinking  Eat a healthy diet that includes fresh fruits and vegetables, whole grains, lean protein, and low-fat dairy products. Take vitamin and mineral supplements as recommended by your health care provider. Do not drink alcohol if: Your health care provider tells you not to drink. You are pregnant, may be pregnant, or are planning to become pregnant. If you drink alcohol: Limit how much you have to 0-1 drink a day. Know how much alcohol is in your drink. In the [place] one drink equals one 12 oz bottle of beer (355 mL), one 5 oz glass of wine (148 mL), or one 1 oz glass of hard liquor (44 mL). Lifestyle Brush your teeth every morning and night with fluoride toothpaste. Floss one time each day. Exercise for at  least 30 minutes 5 or more days each week. Do not use any products that contain nicotine or tobacco. These products include cigarettes, chewing tobacco, and vaping devices, such as e-cigarettes. If you need help  quitting, ask your health care provider. Do not use drugs. If you are sexually active, practice safe sex. Use a condom or other form of protection to prevent STIs. If you do not wish to become pregnant, use a form of birth control. If you plan to become pregnant, see your health care provider for a prepregnancy visit. Find healthy ways to manage stress, such as: Meditation, yoga, or listening to music. Journaling. Talking to a trusted person. Spending time with friends and family. Minimize exposure to UV radiation to reduce your risk of skin cancer. Safety Always wear your seat belt while driving or riding in a vehicle. Do not drive: If you have been drinking alcohol. Do not ride with someone who has been drinking. If you have been using any mind-altering substances or drugs. While texting. When you are tired or distracted. Wear a helmet and other protective equipment during sports activities. If you have firearms in your house, make sure you follow all gun safety procedures. Seek help if you have been physically or sexually abused. What's next? Go to your health care provider once a year for an annual wellness visit. Ask your health care provider how often you should have your eyes and teeth checked. Stay up to date on all vaccines. This information is not intended to replace advice given to you by your health care provider. Make sure you discuss any questions you have with your health care provider. Document Revised: 08/05/2020 Document Reviewed: 08/05/2020 Elsevier Patient Education  [place].

## 2022-01-03 NOTE — Assessment & Plan Note (Signed)
Chronic, waxing and waning, worse in last 19month due to work and home. Reports miscarriage 228monthago. No SI/HI/hallucination/ previous IVC. Unknown Fhx-adopted. Previous use of wellbutrin and prozac-ineffective.  Agreed to start lexepro '10mg'$  and psychology referral. F/up in 39m48month

## 2022-01-05 ENCOUNTER — Ambulatory Visit (INDEPENDENT_AMBULATORY_CARE_PROVIDER_SITE_OTHER): Payer: 59

## 2022-01-05 DIAGNOSIS — E538 Deficiency of other specified B group vitamins: Secondary | ICD-10-CM

## 2022-01-05 MED ORDER — CYANOCOBALAMIN 1000 MCG/ML IJ SOLN
1000.0000 ug | Freq: Once | INTRAMUSCULAR | Status: AC
  Administered 2022-01-05: 1000 ug via INTRAMUSCULAR

## 2022-01-05 NOTE — Progress Notes (Signed)
After obtaining consent, and per orders of [facility], injection of B12  given by Augustina Mood. Patient tolerated injection well and instructed to remain in clinic for 20 minutes afterwards, and to report any adverse reaction to me immediately.

## 2022-01-07 ENCOUNTER — Encounter: Payer: Self-pay | Admitting: Nurse Practitioner

## 2022-01-12 ENCOUNTER — Ambulatory Visit (INDEPENDENT_AMBULATORY_CARE_PROVIDER_SITE_OTHER): Payer: 59

## 2022-01-12 DIAGNOSIS — E538 Deficiency of other specified B group vitamins: Secondary | ICD-10-CM

## 2022-01-12 MED ORDER — CYANOCOBALAMIN 1000 MCG/ML IJ SOLN
1000.0000 ug | Freq: Once | INTRAMUSCULAR | Status: AC
  Administered 2022-01-12: 1000 ug via INTRAMUSCULAR

## 2022-01-12 NOTE — Progress Notes (Signed)
After obtaining consent, and per orders of [facility], injection of B12 given by Lynda Rainwater. Patient instructed to remain in clinic for 20 minutes afterwards, and to report any adverse reaction to me immediately.

## 2022-01-18 ENCOUNTER — Encounter: Payer: Self-pay | Admitting: Nurse Practitioner

## 2022-01-19 ENCOUNTER — Ambulatory Visit (INDEPENDENT_AMBULATORY_CARE_PROVIDER_SITE_OTHER): Payer: 59

## 2022-01-19 DIAGNOSIS — E538 Deficiency of other specified B group vitamins: Secondary | ICD-10-CM

## 2022-01-19 MED ORDER — CYANOCOBALAMIN 1000 MCG/ML IJ SOLN
1000.0000 ug | Freq: Once | INTRAMUSCULAR | Status: AC
  Administered 2022-01-19: 1000 ug via INTRAMUSCULAR

## 2022-01-19 NOTE — Progress Notes (Signed)
After obtaining consent, and per orders of [facility], injection of B12  given by Lynda Rainwater. Patient instructed to remain in clinic for 20 minutes afterwards, and to report any adverse reaction to me immediately.

## 2022-01-20 ENCOUNTER — Ambulatory Visit: Payer: 59 | Admitting: Neurology

## 2022-01-21 ENCOUNTER — Other Ambulatory Visit: Payer: Self-pay | Admitting: Neurology

## 2022-01-24 ENCOUNTER — Other Ambulatory Visit: Payer: Self-pay

## 2022-01-25 ENCOUNTER — Other Ambulatory Visit: Payer: Self-pay | Admitting: Nurse Practitioner

## 2022-01-25 DIAGNOSIS — F331 Major depressive disorder, recurrent, moderate: Secondary | ICD-10-CM

## 2022-01-26 ENCOUNTER — Ambulatory Visit: Payer: 59

## 2022-01-31 ENCOUNTER — Encounter: Payer: Self-pay | Admitting: Nurse Practitioner

## 2022-01-31 ENCOUNTER — Other Ambulatory Visit (INDEPENDENT_AMBULATORY_CARE_PROVIDER_SITE_OTHER): Payer: Self-pay

## 2022-01-31 ENCOUNTER — Ambulatory Visit (INDEPENDENT_AMBULATORY_CARE_PROVIDER_SITE_OTHER): Payer: 59 | Admitting: Nurse Practitioner

## 2022-01-31 ENCOUNTER — Other Ambulatory Visit: Payer: Self-pay | Admitting: *Deleted

## 2022-01-31 ENCOUNTER — Telehealth: Payer: Self-pay | Admitting: *Deleted

## 2022-01-31 VITALS — BP 124/74 | HR 75 | Temp 97.9°F | Ht 62.0 in | Wt 181.0 lb

## 2022-01-31 DIAGNOSIS — G35 Multiple sclerosis: Secondary | ICD-10-CM

## 2022-01-31 DIAGNOSIS — F331 Major depressive disorder, recurrent, moderate: Secondary | ICD-10-CM | POA: Diagnosis not present

## 2022-01-31 DIAGNOSIS — Z79899 Other long term (current) drug therapy: Secondary | ICD-10-CM

## 2022-01-31 DIAGNOSIS — Z0289 Encounter for other administrative examinations: Secondary | ICD-10-CM

## 2022-01-31 DIAGNOSIS — E538 Deficiency of other specified B group vitamins: Secondary | ICD-10-CM

## 2022-01-31 MED ORDER — CYANOCOBALAMIN 1000 MCG/ML IJ SOLN
1000.0000 ug | Freq: Once | INTRAMUSCULAR | Status: AC
  Administered 2022-01-31: 1000 ug via INTRAMUSCULAR

## 2022-01-31 NOTE — Addendum Note (Signed)
Addended by: Wilfred Lacy L on: 01/31/2022 12:58 PM   Modules accepted: Level of Service

## 2022-01-31 NOTE — Assessment & Plan Note (Addendum)
Administer B12 injection today, then switch to monthly injections. Repeat labs in 48month

## 2022-01-31 NOTE — Telephone Encounter (Signed)
Placed JCV lab in [facility] lock box for routine lab pick up. Results pending. 

## 2022-01-31 NOTE — Patient Instructions (Signed)
Maintain lexapro dose and appt with psychology Schedule nurse visit for monthly B12 injection.

## 2022-01-31 NOTE — Assessment & Plan Note (Signed)
Improving mood with lexapro Denies any adverse side effects Has upcoming appt with therapist (1st week in January).  Maintain med dose and appt with therapist

## 2022-01-31 NOTE — Progress Notes (Signed)
Established Patient Visit  Patient: Janet Duran   DOB: 29-Jun-1995   26 y.o. Female  MRN: [id number] Visit Date: 01/31/2022  Subjective:    Chief Complaint  Patient presents with   Office Visit    Anxiety/ depression  B12 injection No concerns    HPI Depression Improving mood with lexapro Denies any adverse side effects Has upcoming appt with therapist (1st week in January).  Maintain med dose and appt with therapist  B12 deficiency Administer B12 injection today, then switch to monthly injections. Repeat labs in 13month     01/31/2022   10:06 AM 01/03/2022    9:37 AM 07/05/2021    2:01 PM  Depression screen PHQ 2/9  Decreased Interest '2 3 2  '$ Down, Depressed, Hopeless '2 3 2  '$ PHQ - 2 Score '4 6 4  '$ Altered sleeping '3 3 3  '$ Tired, decreased energy '3 3 3  '$ Change in appetite '2 3 2  '$ Feeling bad or failure about yourself  '1 2 2  '$ Trouble concentrating '1 2 2  '$ Moving slowly or fidgety/restless '1 2 1  '$ Suicidal thoughts 0 0 0  PHQ-9 Score '15 21 17  '$ Difficult doing work/chores Extremely dIfficult Extremely dIfficult Not difficult at all       01/31/2022   10:06 AM 01/03/2022    9:37 AM 07/05/2021    2:02 PM 12/30/2020    1:56 PM  GAD 7 : Generalized Anxiety Score  Nervous, Anxious, on Edge '2 3 3 2  '$ Control/stop worrying '1 3 3 2  '$ Worry too much - different things '2 3 3 2  '$ Trouble relaxing '1 3 3 2  '$ Restless '1 1 1 1  '$ Easily annoyed or irritable '2 3 3 2  '$ Afraid - awful might happen 1 2 0 1  Total GAD 7 Score '10 18 16 12  '$ Anxiety Difficulty Very difficult Extremely difficult Not difficult at all Somewhat difficult     Reviewed medical, surgical, and social history today  Medications: Outpatient Medications Prior to Visit  Medication Sig   albuterol (VENTOLIN HFA) 108 (90 Base) MCG/ACT inhaler Inhale 1-2 puffs into the lungs every 6 (six) hours as needed for wheezing or shortness of breath.   cyanocobalamin (VITAMIN B12) 1000 MCG/ML injection Inject  1 mL (1,000 mcg total) into the muscle. weekly x 4, then monthly.   escitalopram (LEXAPRO) 10 MG tablet TAKE 1 TABLET BY MOUTH EVERY DAY   natalizumab (TYSABRI) 300 MG/15ML injection Inject into the vein.   norethindrone (MICRONOR) 0.35 MG tablet Take 1 tablet by mouth daily.   tolterodine (DETROL LA) 4 MG 24 hr capsule TAKE 1 CAPSULE BY MOUTH EVERY DAY   Vitamin D, Ergocalciferol, (DRISDOL) 1.25 MG (50000 UNIT) CAPS capsule Take 1 capsule (50,000 Units total) by mouth 2 (two) times a week.   No facility-administered medications prior to visit.   Reviewed past medical and social history.   ROS per HPI above      Objective:  BP 124/74 (BP Location: Right Arm, Patient Position: Sitting, Cuff Size: Normal)   Pulse 75   Temp 97.9 F (36.6 C) (Temporal)   Ht '5\' 2"'$  (1.575 m)   Wt 181 lb (82.1 kg)   SpO2 98%   BMI 33.11 kg/m      Physical Exam Cardiovascular:     Rate and Rhythm: Normal rate.     Pulses: Normal pulses.  Pulmonary:  Effort: Pulmonary effort is normal.  Neurological:     Mental Status: She is alert and oriented to person, place, and time.  Psychiatric:        Mood and Affect: Mood normal.        Behavior: Behavior normal.     No results found for any visits on 01/31/22.    Assessment & Plan:    Problem List Items Addressed This Visit       Other   B12 deficiency    Administer B12 injection today, then switch to monthly injections. Repeat labs in 64month      Depression - Primary    Improving mood with lexapro Denies any adverse side effects Has upcoming appt with therapist (1st week in January).  Maintain med dose and appt with therapist      Return for depression and anxiety,prediabetes, B12 injection, hyperlipidemia (fasting).     CWilfred Lacy NP

## 2022-02-01 LAB — CBC WITH DIFFERENTIAL/PLATELET
Basophils Absolute: 0.2 10*3/uL (ref 0.0–0.2)
Basos: 1 %
EOS (ABSOLUTE): 0.5 10*3/uL — ABNORMAL HIGH (ref 0.0–0.4)
Eos: 3 %
Hematocrit: 42.2 % (ref 34.0–46.6)
Hemoglobin: 14.4 g/dL (ref 11.1–15.9)
Immature Grans (Abs): 0 10*3/uL (ref 0.0–0.1)
Immature Granulocytes: 0 %
Lymphocytes Absolute: 8.3 10*3/uL — ABNORMAL HIGH (ref 0.7–3.1)
Lymphs: 50 %
MCH: 28.8 pg (ref 26.6–33.0)
MCHC: 34.1 g/dL (ref 31.5–35.7)
MCV: 84 fL (ref 79–97)
Monocytes Absolute: 1.2 10*3/uL — ABNORMAL HIGH (ref 0.1–0.9)
Monocytes: 7 %
Neutrophils Absolute: 6.4 10*3/uL (ref 1.4–7.0)
Neutrophils: 39 %
Platelets: 406 10*3/uL (ref 150–450)
RBC: 5 x10E6/uL (ref 3.77–5.28)
RDW: 13.5 % (ref 11.7–15.4)
WBC: 16.5 10*3/uL — ABNORMAL HIGH (ref 3.4–10.8)

## 2022-02-02 ENCOUNTER — Encounter: Payer: Self-pay | Admitting: Neurology

## 2022-02-07 ENCOUNTER — Other Ambulatory Visit: Payer: Self-pay | Admitting: *Deleted

## 2022-02-07 DIAGNOSIS — D72820 Lymphocytosis (symptomatic): Secondary | ICD-10-CM

## 2022-02-08 NOTE — Telephone Encounter (Signed)
JCV ab drawn 01/31/22 negative, index: 0.13

## 2022-02-22 ENCOUNTER — Ambulatory Visit (HOSPITAL_COMMUNITY): Payer: 59 | Admitting: Psychiatry

## 2022-03-03 ENCOUNTER — Ambulatory Visit (INDEPENDENT_AMBULATORY_CARE_PROVIDER_SITE_OTHER): Payer: 59

## 2022-03-03 DIAGNOSIS — E538 Deficiency of other specified B group vitamins: Secondary | ICD-10-CM | POA: Diagnosis not present

## 2022-03-03 MED ORDER — CYANOCOBALAMIN 1000 MCG/ML IJ SOLN
1000.0000 ug | Freq: Once | INTRAMUSCULAR | Status: AC
  Administered 2022-03-03: 1000 ug via INTRAMUSCULAR

## 2022-03-13 ENCOUNTER — Encounter: Payer: Self-pay | Admitting: Neurology

## 2022-03-15 ENCOUNTER — Other Ambulatory Visit: Payer: Self-pay | Admitting: *Deleted

## 2022-03-15 ENCOUNTER — Telehealth: Payer: Self-pay | Admitting: Neurology

## 2022-03-15 DIAGNOSIS — D72820 Lymphocytosis (symptomatic): Secondary | ICD-10-CM

## 2022-03-15 NOTE — Telephone Encounter (Signed)
Referral for Hematology/Oncology sent through [facility] to [facility]. Phone: [phone].

## 2022-03-25 ENCOUNTER — Other Ambulatory Visit: Payer: Self-pay | Admitting: Family

## 2022-03-25 ENCOUNTER — Inpatient Hospital Stay (HOSPITAL_BASED_OUTPATIENT_CLINIC_OR_DEPARTMENT_OTHER): Payer: 59 | Admitting: Family

## 2022-03-25 ENCOUNTER — Inpatient Hospital Stay: Payer: 59 | Attending: Hematology & Oncology

## 2022-03-25 VITALS — BP 114/80 | HR 84 | Temp 98.3°F | Resp 17 | Ht 62.0 in | Wt 184.1 lb

## 2022-03-25 DIAGNOSIS — D72820 Lymphocytosis (symptomatic): Secondary | ICD-10-CM | POA: Diagnosis present

## 2022-03-25 DIAGNOSIS — D72829 Elevated white blood cell count, unspecified: Secondary | ICD-10-CM

## 2022-03-25 DIAGNOSIS — C9110 Chronic lymphocytic leukemia of B-cell type not having achieved remission: Secondary | ICD-10-CM

## 2022-03-25 LAB — CBC WITH DIFFERENTIAL (CANCER CENTER ONLY)
Abs Immature Granulocytes: 0.06 10*3/uL (ref 0.00–0.07)
Basophils Absolute: 0.1 10*3/uL (ref 0.0–0.1)
Basophils Relative: 1 %
Eosinophils Absolute: 0.8 10*3/uL — ABNORMAL HIGH (ref 0.0–0.5)
Eosinophils Relative: 5 %
HCT: 39.8 % (ref 36.0–46.0)
Hemoglobin: 13.9 g/dL (ref 12.0–15.0)
Immature Granulocytes: 0 %
Lymphocytes Relative: 52 %
Lymphs Abs: 8.5 10*3/uL — ABNORMAL HIGH (ref 0.7–4.0)
MCH: 29.7 pg (ref 26.0–34.0)
MCHC: 34.9 g/dL (ref 30.0–36.0)
MCV: 85 fL (ref 80.0–100.0)
Monocytes Absolute: 1.2 10*3/uL — ABNORMAL HIGH (ref 0.1–1.0)
Monocytes Relative: 7 %
Neutro Abs: 5.6 10*3/uL (ref 1.7–7.7)
Neutrophils Relative %: 35 %
Platelet Count: 347 10*3/uL (ref 150–400)
RBC: 4.68 MIL/uL (ref 3.87–5.11)
RDW: 14.2 % (ref 11.5–15.5)
Smear Review: NORMAL
WBC Count: 16.2 10*3/uL — ABNORMAL HIGH (ref 4.0–10.5)
nRBC: 0.4 % — ABNORMAL HIGH (ref 0.0–0.2)

## 2022-03-25 LAB — CMP (CANCER CENTER ONLY)
ALT: 42 U/L (ref 0–44)
AST: 26 U/L (ref 15–41)
Albumin: 4.8 g/dL (ref 3.5–5.0)
Alkaline Phosphatase: 61 U/L (ref 38–126)
Anion gap: 8 (ref 5–15)
BUN: 9 mg/dL (ref 6–20)
CO2: 29 mmol/L (ref 22–32)
Calcium: 10 mg/dL (ref 8.9–10.3)
Chloride: 104 mmol/L (ref 98–111)
Creatinine: 0.71 mg/dL (ref 0.44–1.00)
GFR, Estimated: >60 mL/min (ref >60)
Glucose, Bld: 119 mg/dL — ABNORMAL HIGH (ref 70–99)
Potassium: 4.5 mmol/L (ref 3.5–5.1)
Sodium: 141 mmol/L (ref 135–145)
Total Bilirubin: 0.6 mg/dL (ref 0.3–1.2)
Total Protein: 7.5 g/dL (ref 6.5–8.1)

## 2022-03-25 LAB — LACTATE DEHYDROGENASE: LDH: 164 U/L (ref 98–192)

## 2022-03-25 LAB — SAVE SMEAR(SSMR), FOR PROVIDER SLIDE REVIEW

## 2022-03-25 NOTE — Progress Notes (Signed)
Hematology/Oncology Consultation   Name: Janet Duran      MRN: [id number]    Location: Room/bed info not found  Date: 03/25/2022 Time:11:05 AM   REFERRING PHYSICIAN:  Arlice Colt, MD  REASON FOR CONSULT:  Elevated lymphocyte count   DIAGNOSIS: Elevated lymphocyte count, flow cytometry pending   HISTORY OF PRESENT ILLNESS: Ms. Botello is a very pleasant 27 yo caucasian female with 4 year history of elevated WBC count and recent lymphocytosis.  Lymphocyte count is 52%, neutrophil count 35% and WBC count 16.2.  She has some congestion and sinus drainage that comes and goes. She denies any issue with persistent of recurrent infections.  She has MS and is currently on regular Tysabri injections.  She has numbness and tingling in her hands and feet and occasional fluid retention in her feet and ankles that comes and goes.  She also notes occasional dizziness and SOB with MS.  She states that she was recently diagnosed with any ectopic heart beat and has palpitations sometimes.  She is adopted and does not know her family history.  She had a miscarriage in August 2023 prior to [redacted] weeks gestation. No prior history of pregnancy or miscarriage.  Her cycle has been a little irregular since her miscarriage. Flow is normal with occasional clots.  She has regular migraines.  No history of diabetes or thyroid disease.  She had her wisdom teeth removed without any complications.  No fever, chills, n/v, cough, rash, chest pain, abdominal pain or changes in bowel or bladder habits.  No falls or syncope reported.  No bleeding, bruising or petechiae.  No smoking, ETOH or recreational drug use.  Appetite and hydration are good. She states that her weight is stable at 185 lbs.  She works as a Education officer, museum in foster care.   ROS: All other 10 point review of systems is negative.   PAST MEDICAL HISTORY:   Past Medical History:  Diagnosis Date   Anxiety    Depression    Ectopic beat    Ectopic  heartbeat    Hypertension    Migraine    Multiple sclerosis (HCC)     ALLERGIES: No Known Allergies    MEDICATIONS:  Current Outpatient Medications on File Prior to Visit  Medication Sig Dispense Refill   albuterol (VENTOLIN HFA) 108 (90 Base) MCG/ACT inhaler Inhale 1-2 puffs into the lungs every 6 (six) hours as needed for wheezing or shortness of breath. 6.7 g 0   cyanocobalamin (VITAMIN B12) 1000 MCG/ML injection Inject 1 mL (1,000 mcg total) into the muscle. weekly x 4, then monthly. 1 mL 0   escitalopram (LEXAPRO) 10 MG tablet TAKE 1 TABLET BY MOUTH EVERY DAY 90 tablet 2   natalizumab (TYSABRI) 300 MG/15ML injection Inject into the vein.     norethindrone (MICRONOR) 0.35 MG tablet Take 1 tablet by mouth daily.     tolterodine (DETROL LA) 4 MG 24 hr capsule TAKE 1 CAPSULE BY MOUTH EVERY DAY 90 capsule 0   Vitamin D, Ergocalciferol, (DRISDOL) 1.25 MG (50000 UNIT) CAPS capsule Take 1 capsule (50,000 Units total) by mouth 2 (two) times a week. 5 capsule    No current facility-administered medications on file prior to visit.     PAST SURGICAL HISTORY Past Surgical History:  Procedure Laterality Date   NO PAST SURGERIES      FAMILY HISTORY: Family History  Adopted: Yes    SOCIAL HISTORY:  reports that she has never smoked. She has never used  smokeless tobacco. She reports current alcohol use. She reports that she does not use drugs.  PERFORMANCE STATUS: The patient's performance status is 1 - Symptomatic but completely ambulatory  PHYSICAL EXAM: Most Recent Vital Signs: Blood pressure 114/80, pulse 84, temperature 98.3 F (36.8 C), temperature source Oral, resp. rate 17, height '5\' 2"'$  (1.575 m), weight 184 lb 1.9 oz (83.5 kg), SpO2 100 %. BP 114/80 (BP Location: Left Arm, Patient Position: Sitting)   Pulse 84   Temp 98.3 F (36.8 C) (Oral)   Resp 17   Ht '5\' 2"'$  (1.575 m)   Wt 184 lb 1.9 oz (83.5 kg)   SpO2 100%   BMI 33.68 kg/m   General Appearance:    Alert,  cooperative, no distress, appears stated age  Head:    Normocephalic, without obvious abnormality, atraumatic  Eyes:    PERRL, conjunctiva/corneas clear, EOM's intact, fundi    benign, both eyes        Throat:   Lips, mucosa, and tongue normal; teeth and gums normal  Neck:   Supple, symmetrical, trachea midline, no adenopathy;    thyroid:  no enlargement/tenderness/nodules; no carotid   bruit or JVD  Back:     Symmetric, no curvature, ROM normal, no CVA tenderness  Lungs:     Clear to auscultation bilaterally, respirations unlabored  Chest Wall:    No tenderness or deformity   Heart:    Regular rate and rhythm, S1 and S2 normal, no murmur, rub   or gallop     Abdomen:     Soft, non-tender, bowel sounds active all four quadrants,    no masses, no organomegaly        Extremities:   Extremities normal, atraumatic, no cyanosis or edema  Pulses:   2+ and symmetric all extremities  Skin:   Skin color, texture, turgor normal, no rashes or lesions  Lymph nodes:   Cervical, supraclavicular, and axillary nodes normal  Neurologic:   CNII-XII intact, normal strength, sensation and reflexes    throughout    LABORATORY DATA:  Results for orders placed or performed in visit on 03/25/22 (from the past 48 hour(s))  CBC with Differential ([place] Only)     Status: Abnormal (Preliminary result)   Collection Time: 03/25/22 10:50 AM  Result Value Ref Range   WBC Count 16.2 (H) 4.0 - 10.5 K/uL   RBC 4.68 3.87 - 5.11 MIL/uL   Hemoglobin 13.9 12.0 - 15.0 g/dL   HCT 39.8 36.0 - 46.0 %   MCV 85.0 80.0 - 100.0 fL   MCH 29.7 26.0 - 34.0 pg   MCHC 34.9 30.0 - 36.0 g/dL   RDW 14.2 11.5 - 15.5 %   Platelet Count 347 150 - 400 K/uL   nRBC 0.4 (H) 0.0 - 0.2 %    Comment: Performed at [facility], [address], [place], [place] [address]   Neutrophils Relative % PENDING %   Neutro Abs PENDING 1.7 - 7.7 K/uL   Band Neutrophils PENDING %   Lymphocytes  Relative PENDING %   Lymphs Abs PENDING 0.7 - 4.0 K/uL   Monocytes Relative PENDING %   Monocytes Absolute PENDING 0.1 - 1.0 K/uL   Eosinophils Relative PENDING %   Eosinophils Absolute PENDING 0.0 - 0.5 K/uL   Basophils Relative PENDING %   Basophils Absolute PENDING 0.0 - 0.1 K/uL   WBC Morphology PENDING    RBC Morphology PENDING  Smear Review PENDING    Other PENDING %   nRBC PENDING 0 /100 WBC   Metamyelocytes Relative PENDING %   Myelocytes PENDING %   Promyelocytes Relative PENDING %   Blasts PENDING %   Immature Granulocytes PENDING %   Abs Immature Granulocytes PENDING 0.00 - 0.07 K/uL      RADIOGRAPHY: No results found.     PATHOLOGY: None  ASSESSMENT/PLAN: Ms. Desrosier is a very pleasant 27 yo caucasian female with 4 year history of elevated WBC count and recent lymphocytosis.  Health history, CBC/diff and blood smear reviewed with Dr. Marin Olp. She was noted to have several smudge cells and enlarged lymphocytes on smear.  We have added a flow cytometry to her lab work to assess for possible CLL.  Follow-up pending results.   All questions were answered. The patient knows to call the clinic with any problems, questions or concerns. We can certainly see the patient much sooner if necessary.  The patient was discussed with Dr. Marin Olp and he is in agreement with the aforementioned.   Lottie Dawson, NP

## 2022-03-28 ENCOUNTER — Encounter: Payer: Self-pay | Admitting: Neurology

## 2022-03-30 ENCOUNTER — Encounter: Payer: Self-pay | Admitting: Family

## 2022-03-31 LAB — SURGICAL PATHOLOGY

## 2022-04-01 ENCOUNTER — Telehealth: Payer: Self-pay | Admitting: Family

## 2022-04-01 ENCOUNTER — Other Ambulatory Visit: Payer: Self-pay | Admitting: Family

## 2022-04-01 DIAGNOSIS — C9110 Chronic lymphocytic leukemia of B-cell type not having achieved remission: Secondary | ICD-10-CM

## 2022-04-01 DIAGNOSIS — D72829 Elevated white blood cell count, unspecified: Secondary | ICD-10-CM

## 2022-04-01 NOTE — Progress Notes (Deleted)
Pt here for monthly B12 injection per [facility].  B12 1094mg given IM in the *** deltoid, and pt tolerated injection well.  Next B12 injection scheduled for

## 2022-04-01 NOTE — Telephone Encounter (Signed)
Left message with call back number to go over recent flow cytometry result which was negative. We will see her for follow-up again in 6 months and if all is well at that time we will release her back to her PCP.

## 2022-04-04 ENCOUNTER — Telehealth: Payer: Self-pay | Admitting: *Deleted

## 2022-04-04 NOTE — Telephone Encounter (Signed)
Patient called for her results.  Reviewed Flow Pathology with Dr Marin Olp which was negative.  Per Willey Blade note, we can f/u with her in 6 months and if ok at that time can go back to PCP.  This information was given to patient.  She appreciated the info and call

## 2022-04-05 ENCOUNTER — Ambulatory Visit: Payer: 59 | Admitting: Nurse Practitioner

## 2022-04-05 ENCOUNTER — Encounter: Payer: Self-pay | Admitting: Nurse Practitioner

## 2022-04-05 VITALS — BP 118/76 | HR 89 | Temp 98.2°F | Resp 16 | Ht 62.0 in | Wt 186.0 lb

## 2022-04-05 DIAGNOSIS — E538 Deficiency of other specified B group vitamins: Secondary | ICD-10-CM | POA: Diagnosis not present

## 2022-04-05 DIAGNOSIS — G35 Multiple sclerosis: Secondary | ICD-10-CM

## 2022-04-05 DIAGNOSIS — R739 Hyperglycemia, unspecified: Secondary | ICD-10-CM

## 2022-04-05 DIAGNOSIS — F3341 Major depressive disorder, recurrent, in partial remission: Secondary | ICD-10-CM

## 2022-04-05 DIAGNOSIS — C9110 Chronic lymphocytic leukemia of B-cell type not having achieved remission: Secondary | ICD-10-CM

## 2022-04-05 DIAGNOSIS — E559 Vitamin D deficiency, unspecified: Secondary | ICD-10-CM

## 2022-04-05 DIAGNOSIS — E781 Pure hyperglyceridemia: Secondary | ICD-10-CM

## 2022-04-05 LAB — VITAMIN D 25 HYDROXY (VIT D DEFICIENCY, FRACTURES): VITD: 27.53 ng/mL — ABNORMAL LOW (ref 30.00–100.00)

## 2022-04-05 LAB — LIPID PANEL
Cholesterol: 217 mg/dL — ABNORMAL HIGH (ref 0–200)
HDL: 38.6 mg/dL — ABNORMAL LOW (ref >39.00)
NonHDL: 177.97
Total CHOL/HDL Ratio: 6
Triglycerides: 305 mg/dL — ABNORMAL HIGH (ref 0.0–149.0)
VLDL: 61 mg/dL — ABNORMAL HIGH (ref 0.0–40.0)

## 2022-04-05 LAB — VITAMIN B12: Vitamin B-12: >1500 pg/mL — ABNORMAL HIGH (ref 211–911)

## 2022-04-05 LAB — HEMOGLOBIN A1C: Hgb A1c MFr Bld: 5.7 % (ref 4.6–6.5)

## 2022-04-05 LAB — LDL CHOLESTEROL, DIRECT: Direct LDL: 125 mg/dL

## 2022-04-05 MED ORDER — VITAMIN B-12 1000 MCG PO TABS: 1000.0000 ug | ORAL_TABLET | Freq: Every day | ORAL | 3 refills | Status: DC

## 2022-04-05 MED ORDER — CYANOCOBALAMIN 1000 MCG/ML IJ SOLN
1000.0000 ug | Freq: Once | INTRAMUSCULAR | Status: AC
  Administered 2022-04-05: 1000 ug via INTRAMUSCULAR

## 2022-04-05 MED ORDER — VITAMIN D (ERGOCALCIFEROL) 1.25 MG (50000 UNIT) PO CAPS: 50000.0000 [IU] | ORAL_CAPSULE | ORAL | 0 refills | Status: DC

## 2022-04-05 MED ORDER — FENOFIBRATE 145 MG PO TABS: 145.0000 mg | ORAL_TABLET | Freq: Every day | ORAL | 1 refills | Status: DC

## 2022-04-05 NOTE — Assessment & Plan Note (Addendum)
Repeat B12 today: improved. Normal B12: switch IM injection to oral B12 supplement 1079mg daily.

## 2022-04-05 NOTE — Assessment & Plan Note (Signed)
Stable mood with lexapro She has upcoming appt with therapist

## 2022-04-05 NOTE — Assessment & Plan Note (Signed)
Persistent Abnormal lipid panel:need to start fenobibrate 153m daily. New rx sent Schedule fasting lab appt for repeat lipid panel in 184monthake dietary modifications as indication of paper provided

## 2022-04-05 NOTE — Assessment & Plan Note (Addendum)
Repeat hgbA1c: improved 6.1 to 5.7% Provided printed information on necessary dietary modifications

## 2022-04-05 NOTE — Assessment & Plan Note (Addendum)
Under the care of neurology Current use of tysabri Bladder control improved with use of detrol Denies any muscle weakness or paresthesia or change in vision

## 2022-04-05 NOTE — Progress Notes (Addendum)
Established Patient Visit  Patient: Janet Duran   DOB: August 06, 1995   27 y.o. Female  MRN: [id number] Visit Date: 04/05/2022  Subjective:    Chief Complaint  Patient presents with   Office Visit    Depression and B12 injection    HPI Depression Stable mood with lexapro She has upcoming appt with therapist  Multiple sclerosis ([place]) Under the care of neurology Current use of tysabri Bladder control improved with use of detrol Denies any muscle weakness or paresthesia or change in vision  B12 deficiency Repeat B12 today: improved. Normal B12: switch IM injection to oral B12 supplement 1089mg daily.  Vitamin D deficiency Repeat vit. D: improved Decreased to 50000Iu weekly  Hyperglycemia Repeat hgbA1c: improved 6.1 to 5.7% Provided printed information on necessary dietary modifications  CLL (ch[place] lymphocytic [place]) ([place] Pathology smear 06/2021: Leukocytosis due to absolute lymphocytosis. A few atypical lymphs. Myeloid population consists predominantly of mature segmented neutrophils with mild reactive changes. Platelet clumps noted on smear-count appears increased. RBCs demonstrate trace polychromasia.    Latest Ref Rng & Units 03/25/2022   10:50 AM 01/31/2022    1:22 PM 11/08/2021   11:41 AM  CBC  WBC 4.0 - 10.5 K/uL 16.2  16.5  12.7   Hemoglobin 12.0 - 15.0 g/dL 13.9  14.4  14.2   Hematocrit 36.0 - 46.0 % 39.8  42.2  42.9   Platelets 150 - 400 K/uL 347  406  388     Followed by heamatology   Hypertriglyceridemia Persistent Abnormal lipid panel:need to start fenobibrate 1449mdaily. New rx sent Schedule fasting lab appt for repeat lipid panel in 65m365monthke dietary modifications as indication of paper provided   Reviewed medical, surgical, and social history today  Medications: Outpatient Medications Prior to Visit  Medication Sig   albuterol (VENTOLIN HFA) 108 (90 Base) MCG/ACT inhaler Inhale 1-2 puffs into the lungs every 6 (six)  hours as needed for wheezing or shortness of breath.   escitalopram (LEXAPRO) 10 MG tablet TAKE 1 TABLET BY MOUTH EVERY DAY   natalizumab (TYSABRI) 300 MG/15ML injection Inject into the vein.   norethindrone (MICRONOR) 0.35 MG tablet Take 1 tablet by mouth daily.   tolterodine (DETROL LA) 4 MG 24 hr capsule TAKE 1 CAPSULE BY MOUTH EVERY DAY   [DISCONTINUED] cyanocobalamin (VITAMIN B12) 1000 MCG/ML injection Inject 1 mL (1,000 mcg total) into the muscle. weekly x 4, then monthly.   [DISCONTINUED] Vitamin D, Ergocalciferol, (DRISDOL) 1.25 MG (50000 UNIT) CAPS capsule Take 1 capsule (50,000 Units total) by mouth 2 (two) times a week.   No facility-administered medications prior to visit.   Reviewed past medical and social history.   ROS per HPI above      Objective:  BP 118/76 (BP Location: Left Arm, Patient Position: Sitting, Cuff Size: Normal)   Pulse 89   Temp 98.2 F (36.8 C) (Temporal)   Resp 16   Ht 5' 2"$  (1.575 m)   Wt 186 lb (84.4 kg)   SpO2 98%   BMI 34.02 kg/m      Physical Exam Vitals reviewed.  Cardiovascular:     Rate and Rhythm: Normal rate and regular rhythm.     Pulses: Normal pulses.     Heart sounds: Normal heart sounds.  Pulmonary:     Effort: Pulmonary effort is normal.     Breath sounds: Normal breath sounds.  Neurological:  Mental Status: She is alert and oriented to person, place, and time.  Psychiatric:        Mood and Affect: Mood normal.        Behavior: Behavior normal.        Thought Content: Thought content normal.     Results for orders placed or performed in visit on 04/05/22  B12  Result Value Ref Range   Vitamin B-12 >1500 (H) 211 - 911 pg/mL  Lipid panel  Result Value Ref Range   Cholesterol 217 (H) 0 - 200 mg/dL   Triglycerides 305.0 (H) 0.0 - 149.0 mg/dL   HDL 38.60 (L) >39.00 mg/dL   VLDL 61.0 (H) 0.0 - 40.0 mg/dL   Total CHOL/HDL Ratio 6    NonHDL 177.97   Hemoglobin A1c  Result Value Ref Range   Hgb A1c MFr Bld 5.7  4.6 - 6.5 %  VITAMIN D 25 Hydroxy (Vit-D Deficiency, Fractures)  Result Value Ref Range   VITD 27.53 (L) 30.00 - 100.00 ng/mL  LDL cholesterol, direct  Result Value Ref Range   Direct LDL 125.0 mg/dL      Assessment & Plan:    Problem List Items Addressed This Visit       Nervous and Auditory   Multiple sclerosis ([place])    Under the care of neurology Current use of tysabri Bladder control improved with use of detrol Denies any muscle weakness or paresthesia or change in vision        Other   B12 deficiency - Primary    Repeat B12 today: improved. Normal B12: switch IM injection to oral B12 supplement 10107mg daily.      Relevant Medications   cyanocobalamin (VITAMIN B12) 1000 MCG tablet   Other Relevant Orders   B12 (Completed)   CLL (chronic lymphocytic leukemia) ([place]    Pathology smear 06/2021: Leukocytosis due to absolute lymphocytosis. A few atypical lymphs. Myeloid population consists predominantly of mature segmented neutrophils with mild reactive changes. Platelet clumps noted on smear-count appears increased. RBCs demonstrate trace polychromasia.    Latest Ref Rng & Units 03/25/2022   10:50 AM 01/31/2022    1:22 PM 11/08/2021   11:41 AM  CBC  WBC 4.0 - 10.5 K/uL 16.2  16.5  12.7   Hemoglobin 12.0 - 15.0 g/dL 13.9  14.4  14.2   Hematocrit 36.0 - 46.0 % 39.8  42.2  42.9   Platelets 150 - 400 K/uL 347  406  388     Followed by heamatology       Depression    Stable mood with lexapro She has upcoming appt with therapist      Hyperglycemia    Repeat hgbA1c: improved 6.1 to 5.7% Provided printed information on necessary dietary modifications      Relevant Orders   Hemoglobin A1c (Completed)   Hypertriglyceridemia    Persistent Abnormal lipid panel:need to start fenobibrate 1425mdaily. New rx sent Schedule fasting lab appt for repeat lipid panel in 14m54monthke dietary modifications as indication of paper provided       Relevant Medications    fenofibrate (TRICOR) 145 MG tablet   Vitamin D deficiency    Repeat vit. D: improved Decreased to 50000Iu weekly      Relevant Medications   Vitamin D, Ergocalciferol, (DRISDOL) 1.25 MG (50000 UNIT) CAPS capsule   Other Relevant Orders   VITAMIN D 25 Hydroxy (Vit-D Deficiency, Fractures) (Completed)   Return in about 6 months (around 10/04/2022) for depression and anxiety,  hyperlipidemia (fasting).     Wilfred Lacy, NP

## 2022-04-05 NOTE — Assessment & Plan Note (Addendum)
Pathology smear 06/2021: Leukocytosis due to absolute lymphocytosis. A few atypical lymphs. Myeloid population consists predominantly of mature segmented neutrophils with mild reactive changes. Platelet clumps noted on smear-count appears increased. RBCs demonstrate trace polychromasia.    Latest Ref Rng & Units 03/25/2022   10:50 AM 01/31/2022    1:22 PM 11/08/2021   11:41 AM  CBC  WBC 4.0 - 10.5 K/uL 16.2  16.5  12.7   Hemoglobin 12.0 - 15.0 g/dL 13.9  14.4  14.2   Hematocrit 36.0 - 46.0 % 39.8  42.2  42.9   Platelets 150 - 400 K/uL 347  406  388     Followed by heamatology

## 2022-04-05 NOTE — Progress Notes (Signed)
Abnormal:  improved Vit. D ok to switch vit D dose to once a week Normal B12: switch IM injection to oral B12 supplement 1053mg daily. Improved hgbA1c: 6.1 to 5.7% Persistent Abnormal lipid panel:need to start fenobibrate 1478mdaily. New rx sent Schedule fasting lab appt for repeat lipid panel in 2m56monthke dietary modifications as indication of paper provided

## 2022-04-05 NOTE — Addendum Note (Signed)
Addended by: Leana Gamer on: 04/05/2022 03:26 PM   Modules accepted: Orders

## 2022-04-05 NOTE — Patient Instructions (Addendum)
Maintain current medications Schedule nurse visit for monthly B12 injection Go to lab  Prediabetes Eating Plan Prediabetes is a condition that causes blood sugar (glucose) levels to be higher than normal. This increases the risk for developing type 2 diabetes (type 2 diabetes mellitus). Working with a health care provider or nutrition specialist (dietitian) to make diet and lifestyle changes can help prevent the onset of diabetes. These changes may help you: Control your blood glucose levels. Improve your cholesterol levels. Manage your blood pressure. What are tips for following this plan? Reading food labels Read food labels to check the amount of fat, salt (sodium), and sugar in prepackaged foods. Avoid foods that have: Saturated fats. Trans fats. Added sugars. Avoid foods that have more than 300 milligrams (mg) of sodium per serving. Limit your sodium intake to less than 2,300 mg each day. Shopping Avoid buying pre-made and processed foods. Avoid buying drinks with added sugar. Cooking Cook with olive oil. Do not use butter, lard, or ghee. Bake, broil, grill, steam, or boil foods. Avoid frying. Meal planning  Work with your dietitian to create an eating plan that is right for you. This may include tracking how many calories you take in each day. Use a food diary, notebook, or mobile application to track what you eat at each meal. Consider following a Mediterranean diet. This includes: Eating several servings of fresh fruits and vegetables each day. Eating fish at least twice a week. Eating one serving each day of whole grains, beans, nuts, and seeds. Using olive oil instead of other fats. Limiting alcohol. Limiting red meat. Using nonfat or low-fat dairy products. Consider following a plant-based diet. This includes dietary choices that focus on eating mostly vegetables and fruit, grains, beans, nuts, and seeds. If you have high blood pressure, you may need to limit your sodium  intake or follow a diet such as the DASH (Dietary Approaches to Stop Hypertension) eating plan. The DASH diet aims to lower high blood pressure. Lifestyle Set weight loss goals with help from your health care team. It is recommended that most people with prediabetes lose 7% of their body weight. Exercise for at least 30 minutes 5 or more days a week. Attend a support group or seek support from a mental health counselor. Take over-the-counter and prescription medicines only as told by your health care provider. What foods are recommended? Fruits Berries. Bananas. Apples. Oranges. Grapes. Papaya. Mango. Pomegranate. Kiwi. Grapefruit. Cherries. Vegetables Lettuce. Spinach. Peas. Beets. Cauliflower. Cabbage. Broccoli. Carrots. Tomatoes. Squash. Eggplant. Herbs. Peppers. Onions. Cucumbers. Brussels sprouts. Grains Whole grains, such as whole-wheat or whole-grain breads, crackers, cereals, and pasta. Unsweetened oatmeal. Bulgur. Barley. Quinoa. Brown rice. Corn or whole-wheat flour tortillas or taco shells. Meats and other proteins Seafood. Poultry without skin. Lean cuts of pork and beef. Tofu. Eggs. Nuts. Beans. Dairy Low-fat or fat-free dairy products, such as yogurt, cottage cheese, and cheese. Beverages Water. Tea. Coffee. Sugar-free or diet soda. Seltzer water. Low-fat or nonfat milk. Milk alternatives, such as soy or almond milk. Fats and oils Olive oil. Canola oil. Sunflower oil. Grapeseed oil. Avocado. Walnuts. Sweets and desserts Sugar-free or low-fat pudding. Sugar-free or low-fat ice cream and other frozen treats. Seasonings and condiments Herbs. Sodium-free spices. Mustard. Relish. Low-salt, low-sugar ketchup. Low-salt, low-sugar barbecue sauce. Low-fat or fat-free mayonnaise. The items listed above may not be a complete list of recommended foods and beverages. Contact a dietitian for more information. What foods are not recommended? Fruits Fruits canned with  syrup. Vegetables Canned  vegetables. Frozen vegetables with butter or cream sauce. Grains Refined white flour and flour products, such as bread, pasta, snack foods, and cereals. Meats and other proteins Fatty cuts of meat. Poultry with skin. Breaded or fried meat. Processed meats. Dairy Full-fat yogurt, cheese, or milk. Beverages Sweetened drinks, such as iced tea and soda. Fats and oils Butter. Lard. Ghee. Sweets and desserts Baked goods, such as cake, cupcakes, pastries, cookies, and cheesecake. Seasonings and condiments Spice mixes with added salt. Ketchup. Barbecue sauce. Mayonnaise. The items listed above may not be a complete list of foods and beverages that are not recommended. Contact a dietitian for more information. Where to find more information American Diabetes Association: www.diabetes.org Summary You may need to make diet and lifestyle changes to help prevent the onset of diabetes. These changes can help you control blood sugar, improve cholesterol levels, and manage blood pressure. Set weight loss goals with help from your health care team. It is recommended that most people with prediabetes lose 7% of their body weight. Consider following a Mediterranean diet. This includes eating plenty of fresh fruits and vegetables, whole grains, beans, nuts, seeds, fish, and low-fat dairy, and using olive oil instead of other fats. This information is not intended to replace advice given to you by your health care provider. Make sure you discuss any questions you have with your health care provider. Document Revised: 05/09/2019 Document Reviewed: 05/09/2019 Elsevier Patient Education  [place].

## 2022-04-05 NOTE — Assessment & Plan Note (Addendum)
Repeat vit. D: improved Decreased to 50000Iu weekly

## 2022-04-06 ENCOUNTER — Ambulatory Visit: Payer: 59

## 2022-04-06 DIAGNOSIS — E538 Deficiency of other specified B group vitamins: Secondary | ICD-10-CM

## 2022-04-06 LAB — FLOW CYTOMETRY

## 2022-04-07 ENCOUNTER — Ambulatory Visit: Payer: 59 | Admitting: Neurology

## 2022-04-07 ENCOUNTER — Encounter: Payer: Self-pay | Admitting: Neurology

## 2022-04-07 ENCOUNTER — Other Ambulatory Visit: Payer: Self-pay | Admitting: Nurse Practitioner

## 2022-04-07 VITALS — BP 130/85 | HR 91 | Ht 62.0 in | Wt 186.6 lb

## 2022-04-07 DIAGNOSIS — Z6834 Body mass index (BMI) 34.0-34.9, adult: Secondary | ICD-10-CM

## 2022-04-07 DIAGNOSIS — E669 Obesity, unspecified: Secondary | ICD-10-CM | POA: Diagnosis not present

## 2022-04-07 DIAGNOSIS — Z79899 Other long term (current) drug therapy: Secondary | ICD-10-CM

## 2022-04-07 DIAGNOSIS — G35 Multiple sclerosis: Secondary | ICD-10-CM

## 2022-04-07 DIAGNOSIS — D72820 Lymphocytosis (symptomatic): Secondary | ICD-10-CM | POA: Diagnosis not present

## 2022-04-07 DIAGNOSIS — G43109 Migraine with aura, not intractable, without status migrainosus: Secondary | ICD-10-CM

## 2022-04-07 DIAGNOSIS — Z3040 Encounter for surveillance of contraceptives, unspecified: Secondary | ICD-10-CM

## 2022-04-07 DIAGNOSIS — R5383 Other fatigue: Secondary | ICD-10-CM

## 2022-04-07 MED ORDER — PHENTERMINE HCL 30 MG PO CAPS: 30.0000 mg | ORAL_CAPSULE | ORAL | 5 refills | Status: DC

## 2022-04-07 NOTE — Progress Notes (Signed)
[facility]  PATIENT: Janet Duran DOB: Apr 03, 1995  REFERRING DOCTOR OR PCP: Gurney Maxin, MD SOURCE: Patient, notes from Dr. Melrose Nakayama, imaging and lab reports, MRI images personally reviewed.  _________________________________   HISTORICAL  CHIEF COMPLAINT:  Chief Complaint  Patient presents with   Follow-up    Pt in room 11, here for follow up MS. Pt reports no changes, stable now.    HISTORY OF PRESENT ILLNESS:  Janet Duran is a 27 y.o. woman with relapsing remitting multiple sclerosis.  Update 04/07/2022: She is back on Tysabri and next  infusion is 04/25/2022.   No exacerbations.  No new neurologic symptoms.   JCV Ab was 0.13 on 01/31/2022   She has had elevated lymphocyte and saw hematology.  Flow cytometry was normal.    Gait is unchanged with occ stumbling but no falls.  She sometimes uses the rail on the stairs but often will use it anyhow for safety.  She has had some intermittent leg and hand tingling.  No painful dysesthesias.  She denies weakness in her arms or legs but has some spasticity that has been sable since early in the disease course.    She notes urinary urgency improved with Detrol LA     When hot or tired some of the symptoms are worse.    She has fatigue, physical > cognitive     She sleeps well most nights -- 8 hours on average..    She notes depression and anxiety, not helped by a couple different med's    She notes mild brain fog and has had trouble coming up with the right word at times.    She works at [employer].     She is taking vitamin D supplements 50000 weekly.    She had B12 injectons for deficiency (184) and is now taking pills.     She has some migraine headache.   These have not bothered her too much.  She occasionally takes rizatriptan with benefit most of the time.  Frequency is lower since BP better controlled.  LFTs were elevated.   Hepatitis labs were fine.   She had an U/S and MRI and no  pathology  Weight is stable.      MS history: She was diagnosed with MS in March 2021 after presenting with fuzzy vision in both eyes and numbness in her right hand.   She had a migraine at the time.   Symptoms improved after 15 minutes.   She saw her PCP for suspected complicated migraine and was referred to Dr. Melrose Nakayama.   She had a brain MRI which was consistent with MS.   She also had a cervical spine MRI.   To confirm the diagnosis, she underwent an LP.  It showed oligoclonal bands confirming the diagnosis of MS.    She was started on Tysabri.   She is on Tysabri 300 mg every 6 weeks and tolerates it well.    She has not had not had any exacerbations.   Her next infusion is scheduled for 01/24/2020.   Her last JCV antibody was 0.10 (negative)  08/07/2019.  IMAGING MRI of the brain 04/27/2019 showed multiple T2/FLAIR hyperintense foci in the hemispheres, many in the periventricular white matter radially oriented to the ventricles.  The foci was also present in the lateral left thalamus, right pons and right cerebellum.  None of the foci enhanced.  MRI of the cervical spine showed abnormal T2 hyperintense foci to the  left adjacent to C6 and centrally adjacent to C4-C5.  The C6 focus enhanced after contrast.  MRI 01/07/2020 showed T2/FLAIR hyperintense foci in the hemispheres, left thalamus and right cerebellar hemisphere in a pattern and configuration consistent with chronic demyelinating plaque associated with multiple sclerosis.  None of the foci appear to be acute.  They do not enhance.    There is a normal enhancement pattern.  LABS Lab work from 05/02/2019 was reviewed.  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  Family History: She is adopted.      REVIEW OF SYSTEMS: Constitutional: No fevers, chills, sweats, or change in appetite Eyes: No visual changes, double vision, eye pain Ear, nose and throat: No hearing loss, ear pain, nasal congestion, sore throat Cardiovascular: No chest pain, palpitations Respiratory:  No shortness of breath at rest or with exertion.   No wheezes GastrointestinaI: No nausea, vomiting, diarrhea, abdominal pain, fecal incontinence Genitourinary:  No dysuria, urinary retention or frequency.  No nocturia. Musculoskeletal:  No neck pain, back pain Integumentary: No rash, pruritus, skin lesions Neurological: as above Psychiatric: No depression at this time.  No anxiety Endocrine: No palpitations, diaphoresis, change in appetite, change in weigh or increased thirst Hematologic/Lymphatic:  No anemia, purpura, petechiae. Allergic/Immunologic: No itchy/runny eyes, nasal congestion, recent allergic reactions, rashes  ALLERGIES: No Known Allergies  HOME MEDICATIONS:  Current Outpatient Medications:    albuterol (VENTOLIN HFA) 108 (90 Base) MCG/ACT inhaler, Inhale 1-2 puffs into the lungs every 6 (six) hours as needed for wheezing or shortness of breath., Disp: 6.7 g, Rfl: 0   cyanocobalamin (VITAMIN B12) 1000 MCG tablet, Take 1 tablet (1,000 mcg total) by mouth daily., Disp: 90 tablet, Rfl: 3   escitalopram (LEXAPRO) 10 MG tablet, TAKE 1 TABLET BY MOUTH EVERY DAY, Disp: 90 tablet, Rfl: 2   fenofibrate (TRICOR) 145 MG tablet, Take 1 tablet (145 mg total) by mouth daily., Disp: 90 tablet, Rfl: 1   natalizumab (TYSABRI) 300 MG/15ML injection, Inject into the vein., Disp: , Rfl:    norethindrone (MICRONOR) 0.35 MG tablet, Take 1 tablet by mouth daily., Disp: , Rfl:    phentermine 30 MG capsule, Take 1 capsule (30 mg total) by mouth every morning., Disp: 30 capsule, Rfl: 5   tolterodine (DETROL LA) 4 MG 24 hr capsule, TAKE 1 CAPSULE BY  MOUTH EVERY DAY, Disp: 90 capsule, Rfl: 0   Vitamin D, Ergocalciferol, (DRISDOL) 1.25 MG (50000 UNIT) CAPS capsule, Take 1 capsule (50,000 Units total) by mouth every 7 (seven) days., Disp: 12 capsule, Rfl: 0  PAST MEDICAL HISTORY: Past Medical History:  Diagnosis Date   Anxiety    Depression    Ectopic beat    Ectopic heartbeat    Hypertension    Less than [redacted] weeks gestation of pregnancy 09/27/2021   Migraine    Multiple sclerosis ([place])    Urinary urgency 12/26/2019    PAST SURGICAL HISTORY: Past Surgical History:  Procedure Laterality Date   NO PAST SURGERIES      FAMILY HISTORY: Family History  Adopted: Yes    SOCIAL HISTORY:  Social History   Socioeconomic History   Marital status: Single    Spouse name: Not on file   Number of children:  0   Years of education: Bachelors    Highest education level: Not on file  Occupational History   Occupation: [employer]   Tobacco Use   Smoking status: Never   Smokeless tobacco: Never  Vaping Use   Vaping Use: Never used  Substance and Sexual Activity   Alcohol use: Yes    Comment: rare   Drug use: Never   Sexual activity: Yes    Birth control/protection: Pill  Other Topics Concern   Not on file  Social History Narrative   Right handed    Lives with boyfriend   Caffeine use: Soda daily ( 1 per day)   Social Determinants of Health   Financial Resource Strain: Low Risk  (03/25/2022)   Overall Financial Resource Strain (CARDIA)    Difficulty of Paying Living Expenses: Not hard at all  Food Insecurity: No Food Insecurity (03/25/2022)   Hunger Vital Sign    Worried About Running Out of Food in the Last Year: Never true    Ran Out of Food in the Last Year: Never true  Transportation Needs: No Transportation Needs (03/25/2022)   PRAPARE - Hydrologist (Medical): No    Lack of Transportation (Non-Medical): No  Physical Activity: Insufficiently Active (03/25/2022)   Exercise Vital Sign     Days of Exercise per Week: 2 days    Minutes of Exercise per Session: 30 min  Stress: No Stress Concern Present (03/25/2022)   [place]    Feeling of Stress : Not at all  Social Connections: Not on file  Intimate Partner Violence: Not At Risk (03/25/2022)   Humiliation, Afraid, Rape, and Kick questionnaire    Fear of Current or Ex-Partner: No    Emotionally Abused: No    Physically Abused: No    Sexually Abused: No     PHYSICAL EXAM  Vitals:   04/07/22 1317  BP: 130/85  Pulse: 91  Weight: 186 lb 9.6 oz (84.6 kg)  Height: 5' 2"$  (1.575 m)    Body mass index is 34.13 kg/m.  No results found.  General: The patient is well-developed and well-nourished and in no acute distress  HEENT:  Head is [place]/AT.  Sclera are anicteric.    Neck:  The neck is nontender.   Skin: Extremities are without rash or  edema.    Neurologic Exam  Mental status: The patient is alert and oriented x 3 at the time of the examination. The patient has apparent normal recent and remote memory, with an apparently normal attention span and concentration ability.   Speech is normal.  Cranial nerves: Extraocular movements are full. Facial strength and sensation are normal.  No obvious hearing deficits are noted.  Motor:  Muscle bulk is normal.   Tone is normal. Strength is  5 / 5 in all 4 extremities.   Sensory: Sensory testing is intact to pinprick, soft touch and vibration sensation in all 4 extremities.  Coordination: Cerebellar testing reveals good finger-nose-finger and heel-to-shin bilaterally.  Gait and station: Station is normal.   Gait was normal.  Tandem gait is minimally wide.  Romberg is negative.  Reflexes: Deep tendon reflexes are symmetric and normal bilaterally.       DIAGNOSTIC DATA (LABS, IMAGING, TESTING) - I reviewed patient records, labs, notes, testing and imaging myself where available.  Lab Results   Component Value Date   WBC 16.2 (H) 03/25/2022   HGB 13.9 03/25/2022  HCT 39.8 03/25/2022   MCV 85.0 03/25/2022   PLT 347 03/25/2022      Component Value Date/Time   NA 141 03/25/2022 1050   NA 138 06/10/2020 1210   K 4.5 03/25/2022 1050   CL 104 03/25/2022 1050   CO2 29 03/25/2022 1050   GLUCOSE 119 (H) 03/25/2022 1050   BUN 9 03/25/2022 1050   BUN 8 06/10/2020 1210   CREATININE 0.71 03/25/2022 1050   CALCIUM 10.0 03/25/2022 1050   PROT 7.5 03/25/2022 1050   PROT 7.5 12/23/2020 1327   ALBUMIN 4.8 03/25/2022 1050   ALBUMIN 5.1 (H) 12/23/2020 1327   AST 26 03/25/2022 1050   ALT 42 03/25/2022 1050   ALKPHOS 61 03/25/2022 1050   BILITOT 0.6 03/25/2022 1050   GFRNONAA >60 03/25/2022 1050   GFRAA 147 02/01/2019 1543   Lab Results  Component Value Date   CHOL 217 (H) 04/05/2022   HDL 38.60 (L) 04/05/2022   LDLCALC 160 (H) 07/05/2021   LDLDIRECT 125.0 04/05/2022   TRIG 305.0 (H) 04/05/2022   CHOLHDL 6 04/05/2022    Lab Results  Component Value Date   TSH 1.20 07/05/2021       ASSESSMENT AND PLAN  Multiple sclerosis (HCC)  Class 1 obesity without serious comorbidity with body mass index (BMI) of 34.0 to 34.9 in adult, unspecified obesity type  Elevated lymphocyte count  High risk medication use  Migraine with aura and without status migrainosus, not intractable  Other fatigue    1.  Continue Tysabri and check JCV q 6 months or so  2.    Trial of phentermine to help with eigh loss and fatigue 3.   Stay active 4.    rtc 6 months  Inri Sobieski A. Felecia Shelling, MD, [facility] [id number], [id number] PM Certified in Neurology, Clinical Neurophysiology, Sleep Medicine and Neuroimaging  [facility] [address] [place], [place] [address] [phone]

## 2022-04-14 ENCOUNTER — Ambulatory Visit: Payer: 59 | Admitting: Neurology

## 2022-04-25 ENCOUNTER — Telehealth: Payer: Self-pay | Admitting: *Deleted

## 2022-04-25 ENCOUNTER — Other Ambulatory Visit: Payer: Self-pay | Admitting: *Deleted

## 2022-04-25 ENCOUNTER — Other Ambulatory Visit: Payer: Self-pay

## 2022-04-25 DIAGNOSIS — G35 Multiple sclerosis: Secondary | ICD-10-CM

## 2022-04-25 DIAGNOSIS — Z79899 Other long term (current) drug therapy: Secondary | ICD-10-CM

## 2022-04-25 NOTE — Telephone Encounter (Signed)
Placed JCV lab in [facility] lock box for routine lab pick up. Results pending. 

## 2022-04-26 LAB — CBC WITH DIFFERENTIAL/PLATELET
Basophils Absolute: 0.1 10*3/uL (ref 0.0–0.2)
Basos: 1 %
EOS (ABSOLUTE): 0.7 10*3/uL — ABNORMAL HIGH (ref 0.0–0.4)
Eos: 5 %
Hematocrit: 41.4 % (ref 34.0–46.6)
Hemoglobin: 13.7 g/dL (ref 11.1–15.9)
Immature Grans (Abs): 0 10*3/uL (ref 0.0–0.1)
Immature Granulocytes: 0 %
Lymphocytes Absolute: 7.5 10*3/uL — ABNORMAL HIGH (ref 0.7–3.1)
Lymphs: 52 %
MCH: 29.1 pg (ref 26.6–33.0)
MCHC: 33.1 g/dL (ref 31.5–35.7)
MCV: 88 fL (ref 79–97)
Monocytes Absolute: 1.2 10*3/uL — ABNORMAL HIGH (ref 0.1–0.9)
Monocytes: 8 %
Neutrophils Absolute: 5 10*3/uL (ref 1.4–7.0)
Neutrophils: 34 %
Platelets: 388 10*3/uL (ref 150–450)
RBC: 4.71 x10E6/uL (ref 3.77–5.28)
RDW: 13.2 % (ref 11.7–15.4)
WBC: 14.6 10*3/uL — ABNORMAL HIGH (ref 3.4–10.8)

## 2022-05-03 ENCOUNTER — Other Ambulatory Visit: Payer: Self-pay | Admitting: Neurology

## 2022-05-03 NOTE — Telephone Encounter (Signed)
Pt last seen on 04/07/22 Follow up scheduled on 10/13/22

## 2022-05-05 ENCOUNTER — Ambulatory Visit: Payer: 59

## 2022-07-25 ENCOUNTER — Other Ambulatory Visit: Payer: Self-pay

## 2022-07-25 ENCOUNTER — Telehealth: Payer: Self-pay

## 2022-07-25 DIAGNOSIS — G35 Multiple sclerosis: Secondary | ICD-10-CM

## 2022-07-25 DIAGNOSIS — Z79899 Other long term (current) drug therapy: Secondary | ICD-10-CM

## 2022-07-25 NOTE — Telephone Encounter (Signed)
Put JVC in [facility] box 07/25/2022

## 2022-07-26 LAB — CBC WITH DIFFERENTIAL/PLATELET
Basophils Absolute: 0.2 10*3/uL (ref 0.0–0.2)
Basos: 1 %
EOS (ABSOLUTE): 0.6 10*3/uL — ABNORMAL HIGH (ref 0.0–0.4)
Eos: 3 %
Hematocrit: 40 % (ref 34.0–46.6)
Hemoglobin: 13.8 g/dL (ref 11.1–15.9)
Immature Grans (Abs): 0 10*3/uL (ref 0.0–0.1)
Immature Granulocytes: 0 %
Lymphocytes Absolute: 7.3 10*3/uL — ABNORMAL HIGH (ref 0.7–3.1)
Lymphs: 46 %
MCH: 30.1 pg (ref 26.6–33.0)
MCHC: 34.5 g/dL (ref 31.5–35.7)
MCV: 87 fL (ref 79–97)
Monocytes Absolute: 1.2 10*3/uL — ABNORMAL HIGH (ref 0.1–0.9)
Monocytes: 7 %
Neutrophils Absolute: 6.9 10*3/uL (ref 1.4–7.0)
Neutrophils: 43 %
Platelets: 440 10*3/uL (ref 150–450)
RBC: 4.59 x10E6/uL (ref 3.77–5.28)
RDW: 12.9 % (ref 11.7–15.4)
WBC: 16.2 10*3/uL — ABNORMAL HIGH (ref 3.4–10.8)

## 2022-07-30 ENCOUNTER — Other Ambulatory Visit: Payer: Self-pay | Admitting: Neurology

## 2022-08-01 NOTE — Telephone Encounter (Signed)
Last seen on 04/07/22 per note "She notes urinary urgency improved with Detrol LA " Follow up scheduled on 10/13/22 Last filled on 06/30/22 #30 tablets (30 day supply)

## 2022-08-09 ENCOUNTER — Encounter: Payer: Self-pay | Admitting: Neurology

## 2022-09-21 ENCOUNTER — Encounter (INDEPENDENT_AMBULATORY_CARE_PROVIDER_SITE_OTHER): Payer: Self-pay

## 2022-10-02 ENCOUNTER — Other Ambulatory Visit: Payer: Self-pay | Admitting: Nurse Practitioner

## 2022-10-02 DIAGNOSIS — E781 Pure hyperglyceridemia: Secondary | ICD-10-CM

## 2022-10-04 ENCOUNTER — Encounter: Payer: Self-pay | Admitting: Nurse Practitioner

## 2022-10-04 ENCOUNTER — Other Ambulatory Visit: Payer: Self-pay | Admitting: Nurse Practitioner

## 2022-10-04 ENCOUNTER — Ambulatory Visit: Payer: 59 | Admitting: Nurse Practitioner

## 2022-10-04 VITALS — BP 124/82 | HR 84 | Temp 98.4°F | Resp 16 | Ht 62.0 in | Wt 168.8 lb

## 2022-10-04 DIAGNOSIS — Z3041 Encounter for surveillance of contraceptive pills: Secondary | ICD-10-CM | POA: Diagnosis not present

## 2022-10-04 DIAGNOSIS — E781 Pure hyperglyceridemia: Secondary | ICD-10-CM | POA: Diagnosis not present

## 2022-10-04 DIAGNOSIS — E538 Deficiency of other specified B group vitamins: Secondary | ICD-10-CM

## 2022-10-04 DIAGNOSIS — R739 Hyperglycemia, unspecified: Secondary | ICD-10-CM | POA: Diagnosis not present

## 2022-10-04 DIAGNOSIS — F331 Major depressive disorder, recurrent, moderate: Secondary | ICD-10-CM

## 2022-10-04 DIAGNOSIS — L700 Acne vulgaris: Secondary | ICD-10-CM | POA: Insufficient documentation

## 2022-10-04 LAB — LIPID PANEL
Cholesterol: 195 mg/dL (ref 0–200)
HDL: 30.8 mg/dL — ABNORMAL LOW (ref >39.00)
LDL Cholesterol: 145 mg/dL — ABNORMAL HIGH (ref 0–99)
NonHDL: 164.3
Total CHOL/HDL Ratio: 6
Triglycerides: 97 mg/dL (ref 0.0–149.0)
VLDL: 19.4 mg/dL (ref 0.0–40.0)

## 2022-10-04 LAB — HEMOGLOBIN A1C: Hgb A1c MFr Bld: 5.8 % (ref 4.6–6.5)

## 2022-10-04 LAB — VITAMIN B12: Vitamin B-12: 1042 pg/mL — ABNORMAL HIGH (ref 211–911)

## 2022-10-04 MED ORDER — BENZOYL PEROXIDE-ERYTHROMYCIN 5-3 % EX GEL
Freq: Two times a day (BID) | CUTANEOUS | 1 refills | Status: DC
Start: 2022-10-04 — End: 2023-05-02

## 2022-10-04 MED ORDER — NORETHINDRONE 0.35 MG PO TABS
1.0000 | ORAL_TABLET | Freq: Every day | ORAL | 3 refills | Status: DC
Start: 2022-10-04 — End: 2023-12-21

## 2022-10-04 MED ORDER — ESCITALOPRAM OXALATE 20 MG PO TABS
20.0000 mg | ORAL_TABLET | Freq: Every day | ORAL | 3 refills | Status: DC
Start: 2022-10-04 — End: 2023-08-31

## 2022-10-04 NOTE — Progress Notes (Signed)
Established Patient Visit  Patient: Janet Duran   DOB: 1996/01/16   27 y.o. Female  MRN: [id number] Visit Date: 10/04/2022  Subjective:    Chief Complaint  Patient presents with   Anxiety   Depression    6 mos f/u    HPI Hypertriglyceridemia Repeat lipid panel Maintain fenofibrate dose  Hyperglycemia Repeat hgbA1c  Depression Minimal improvement with lexapro 10mg  Did not schedule appointment with psychology as discussed No SI/HI/hallucination No illicit drug use, no ALCOHOL use.  Agreed to increased lexapro dose 20mg  every day and schedule appointment with psychology F/up in 110month  B12 deficiency Last B12 at >1500 Advised to switch IM to oral B12 supplement Repeat B12 level today  Acne vulgaris Chronic,  on face, chest, back, and upper legs. No improvement with Neutrogena OVER THE COUNTER body wash.  Sent benzamycin gel Entered dermatology referral to discuss use of accutane  Body acne:   Reviewed medical, surgical, and social history today  Medications: Outpatient Medications Prior to Visit  Medication Sig   albuterol (VENTOLIN HFA) 108 (90 Base) MCG/ACT inhaler Inhale 1-2 puffs into the lungs every 6 (six) hours as needed for wheezing or shortness of breath.   cyanocobalamin (VITAMIN B12) 1000 MCG tablet Take 1 tablet (1,000 mcg total) by mouth daily.   fenofibrate (TRICOR) 145 MG tablet TAKE 1 TABLET BY MOUTH EVERY DAY   natalizumab (TYSABRI) 300 MG/15ML injection Inject into the vein.   phentermine 30 MG capsule Take 1 capsule (30 mg total) by mouth every morning.   tolterodine (DETROL LA) 4 MG 24 hr capsule TAKE 1 CAPSULE BY MOUTH EVERY DAY   Vitamin D, Ergocalciferol, (DRISDOL) 1.25 MG (50000 UNIT) CAPS capsule Take 1 capsule (50,000 Units total) by mouth every 7 (seven) days.   [DISCONTINUED] escitalopram (LEXAPRO) 10 MG tablet TAKE 1 TABLET BY MOUTH EVERY DAY   [DISCONTINUED] norethindrone (MICRONOR) 0.35 MG tablet TAKE 1 TABLET  BY MOUTH EVERY DAY   No facility-administered medications prior to visit.   Reviewed past medical and social history.   ROS per HPI above  Last CBC Lab Results  Component Value Date   WBC 16.2 (H) 07/25/2022   HGB 13.8 07/25/2022   HCT 40.0 07/25/2022   MCV 87 07/25/2022   MCH 30.1 07/25/2022   RDW 12.9 07/25/2022   PLT 440 07/25/2022   Last metabolic panel Lab Results  Component Value Date   GLUCOSE 119 (H) 03/25/2022   NA 141 03/25/2022   K 4.5 03/25/2022   CL 104 03/25/2022   CO2 29 03/25/2022   BUN 9 03/25/2022   CREATININE 0.71 03/25/2022   GFRNONAA >60 03/25/2022   CALCIUM 10.0 03/25/2022   PROT 7.5 03/25/2022   ALBUMIN 4.8 03/25/2022   LABGLOB 2.6 06/10/2020   AGRATIO 1.8 06/10/2020   BILITOT 0.6 03/25/2022   ALKPHOS 61 03/25/2022   AST 26 03/25/2022   ALT 42 03/25/2022   ANIONGAP 8 03/25/2022   Last hemoglobin A1c Lab Results  Component Value Date   HGBA1C 5.7 04/05/2022   Last thyroid functions Lab Results  Component Value Date   TSH 1.20 07/05/2021   Last vitamin D Lab Results  Component Value Date   VD25OH 27.53 (L) 04/05/2022   Last vitamin B12 and Folate Lab Results  Component Value Date   VITAMINB12 >1500 (H) 04/05/2022      Objective:  BP 124/82 (BP Location: Left Arm, Patient Position:  Sitting, Cuff Size: Normal)   Pulse 84   Temp 98.4 F (36.9 C) (Temporal)   Resp 16   Ht 5\' 2"  (1.575 m)   Wt 168 lb 12.8 oz (76.6 kg)   LMP 09/30/2022 (Exact Date)   SpO2 98%   BMI 30.87 kg/m      Physical Exam Cardiovascular:     Rate and Rhythm: Normal rate and regular rhythm.     Pulses: Normal pulses.     Heart sounds: Normal heart sounds.  Pulmonary:     Effort: Pulmonary effort is normal.     Breath sounds: Normal breath sounds.  Neurological:     Mental Status: She is alert and oriented to person, place, and time.  Psychiatric:        Mood and Affect: Mood normal.        Behavior: Behavior normal.        Thought Content:  Thought content normal.     No results found for any visits on 10/04/22.    Assessment & Plan:    Problem List Items Addressed This Visit       Musculoskeletal and Integument   Acne vulgaris    Chronic,  on face, chest, back, and upper legs. No improvement with Neutrogena OVER THE COUNTER body wash.  Sent benzamycin gel Entered dermatology referral to discuss use of accutane       Relevant Medications   benzoyl peroxide-erythromycin (BENZAMYCIN) gel   Other Relevant Orders   Ambulatory referral to Dermatology     Other   B12 deficiency    Last B12 at >1500 Advised to switch IM to oral B12 supplement Repeat B12 level today      Relevant Orders   B12   Depression - Primary    Minimal improvement with lexapro 10mg  Did not schedule appointment with psychology as discussed No SI/HI/hallucination No illicit drug use, no ALCOHOL use.  Agreed to increased lexapro dose 20mg  every day and schedule appointment with psychology F/up in 73month      Relevant Medications   escitalopram (LEXAPRO) 20 MG tablet   Hyperglycemia    Repeat hgbA1c      Relevant Orders   Hemoglobin A1c   Hypertriglyceridemia    Repeat lipid panel Maintain fenofibrate dose      Relevant Orders   Lipid panel   Other Visit Diagnoses     Encounter for surveillance of contraceptive pills       Relevant Medications   norethindrone (MICRONOR) 0.35 MG tablet      Return in about 3 months (around 01/04/2023) for depression and anxiety, HTN, hyperlipidemia (fasting).     Alysia Penna, NP

## 2022-10-04 NOTE — Patient Instructions (Addendum)
Go to lab Continue Heart healthy diet and daily exercise. Increase lexapro to 20mg  daily Hold B12 supplement

## 2022-10-04 NOTE — Assessment & Plan Note (Signed)
Last B12 at >1500 Advised to switch IM to oral B12 supplement Repeat B12 level today

## 2022-10-04 NOTE — Assessment & Plan Note (Signed)
Repeat hgbA1c 

## 2022-10-04 NOTE — Assessment & Plan Note (Signed)
Chronic,  on face, chest, back, and upper legs. No improvement with Neutrogena OVER THE COUNTER body wash.  Sent benzamycin gel Entered dermatology referral to discuss use of accutane

## 2022-10-04 NOTE — Assessment & Plan Note (Signed)
Minimal improvement with lexapro 10mg  Did not schedule appointment with psychology as discussed No SI/HI/hallucination No illicit drug use, no ALCOHOL use.  Agreed to increased lexapro dose 20mg  every day and schedule appointment with psychology F/up in 33month

## 2022-10-04 NOTE — Assessment & Plan Note (Signed)
Repeat lipid panel Maintain fenofibrate dose

## 2022-10-04 NOTE — Assessment & Plan Note (Signed)
Slight increase in HgbA1c: 5.7 to 5.8%  Improved lipid panel: stop fenofibrate No tobacco use, no HYPERTENSION, no known CVD. maintain heart healthy diet: eliminate added sugars, restrict fat to <10 to 15% (preferably <5%) of total calories, and abstain from alcohol.

## 2022-10-05 ENCOUNTER — Encounter: Payer: Self-pay | Admitting: Family

## 2022-10-05 ENCOUNTER — Other Ambulatory Visit: Payer: Self-pay

## 2022-10-05 ENCOUNTER — Inpatient Hospital Stay: Payer: 59 | Attending: Hematology & Oncology

## 2022-10-05 ENCOUNTER — Inpatient Hospital Stay: Payer: 59 | Admitting: Family

## 2022-10-05 VITALS — BP 134/86 | HR 93 | Temp 98.8°F | Resp 17 | Ht 62.0 in | Wt 169.4 lb

## 2022-10-05 DIAGNOSIS — D72829 Elevated white blood cell count, unspecified: Secondary | ICD-10-CM

## 2022-10-05 DIAGNOSIS — Z79899 Other long term (current) drug therapy: Secondary | ICD-10-CM

## 2022-10-05 DIAGNOSIS — G35 Multiple sclerosis: Secondary | ICD-10-CM

## 2022-10-05 LAB — CMP (CANCER CENTER ONLY)
ALT: 22 U/L (ref 0–44)
AST: 14 U/L — ABNORMAL LOW (ref 15–41)
Albumin: 5 g/dL (ref 3.5–5.0)
Alkaline Phosphatase: 45 U/L (ref 38–126)
Anion gap: 8 (ref 5–15)
BUN: 12 mg/dL (ref 6–20)
CO2: 29 mmol/L (ref 22–32)
Calcium: 9.6 mg/dL (ref 8.9–10.3)
Chloride: 104 mmol/L (ref 98–111)
Creatinine: 0.83 mg/dL (ref 0.44–1.00)
GFR, Estimated: >60 mL/min (ref >60)
Glucose, Bld: 119 mg/dL — ABNORMAL HIGH (ref 70–99)
Potassium: 3.9 mmol/L (ref 3.5–5.1)
Sodium: 141 mmol/L (ref 135–145)
Total Bilirubin: 0.6 mg/dL (ref 0.3–1.2)
Total Protein: 7.3 g/dL (ref 6.5–8.1)

## 2022-10-05 LAB — SAVE SMEAR(SSMR), FOR PROVIDER SLIDE REVIEW

## 2022-10-05 LAB — CBC WITH DIFFERENTIAL (CANCER CENTER ONLY)
Abs Immature Granulocytes: 0.04 10*3/uL (ref 0.00–0.07)
Basophils Absolute: 0.1 10*3/uL (ref 0.0–0.1)
Basophils Relative: 1 %
Eosinophils Absolute: 0.4 10*3/uL (ref 0.0–0.5)
Eosinophils Relative: 3 %
HCT: 39.9 % (ref 36.0–46.0)
Hemoglobin: 14.1 g/dL (ref 12.0–15.0)
Immature Granulocytes: 0 %
Lymphocytes Relative: 56 %
Lymphs Abs: 8 10*3/uL — ABNORMAL HIGH (ref 0.7–4.0)
MCH: 29.9 pg (ref 26.0–34.0)
MCHC: 35.3 g/dL (ref 30.0–36.0)
MCV: 84.5 fL (ref 80.0–100.0)
Monocytes Absolute: 1.2 10*3/uL — ABNORMAL HIGH (ref 0.1–1.0)
Monocytes Relative: 8 %
Neutro Abs: 4.6 10*3/uL (ref 1.7–7.7)
Neutrophils Relative %: 32 %
Platelet Count: 395 10*3/uL (ref 150–400)
RBC: 4.72 MIL/uL (ref 3.87–5.11)
RDW: 13.6 % (ref 11.5–15.5)
Smear Review: NORMAL
WBC Count: 14.3 10*3/uL — ABNORMAL HIGH (ref 4.0–10.5)
WBC Morphology: ABNORMAL
nRBC: 0 % (ref 0.0–0.2)

## 2022-10-05 LAB — LACTATE DEHYDROGENASE: LDH: 141 U/L (ref 98–192)

## 2022-10-05 NOTE — Progress Notes (Signed)
Hematology and Oncology Follow Up Visit  AMEN MIKULA [id number] 1995-07-22 27 y.o. 10/05/2022   Principle Diagnosis:  Elevated WBC count- reactive   Current Therapy:   Observation   Interim History:  Ms. Bero is here today for follow-up. She is doing well and has no complaints at this time.  WBC count is stable at 14.3! She has had no issue with infection.  No adenopathy noted.  No fever, chills, n/v, cough, rash, SOB, chest pain, abdominal pain or changes in bowel or bladder habits.   She notes occasional dizziness and tingling in her hands and feet which she associated with MS.  She does the Tysabri injection every 6 weeks.  No falls or syncope reported.  No swelling in her extremities.  Appetite and hydration are good. Weight is stable at 169 lbs.   ECOG Performance Status: 0 - Asymptomatic  Medications:  Allergies as of 10/05/2022   No Known Allergies      Medication List        Accurate as of October 05, 2022 10:07 AM. If you have any questions, ask your nurse or doctor.          albuterol 108 (90 Base) MCG/ACT inhaler Commonly known as: VENTOLIN HFA Inhale 1-2 puffs into the lungs every 6 (six) hours as needed for wheezing or shortness of breath.   benzoyl peroxide-erythromycin gel Commonly known as: BENZAMYCIN Apply topically 2 (two) times daily. Use once a day if causes excessive dryness   cyanocobalamin 1000 MCG tablet Commonly known as: VITAMIN B12 Take 1 tablet (1,000 mcg total) by mouth daily.   escitalopram 20 MG tablet Commonly known as: LEXAPRO Take 1 tablet (20 mg total) by mouth daily.   natalizumab 300 MG/15ML injection Commonly known as: TYSABRI Inject into the vein.   norethindrone 0.35 MG tablet Commonly known as: MICRONOR Take 1 tablet (0.35 mg total) by mouth daily.   phentermine 30 MG capsule Take 1 capsule (30 mg total) by mouth every morning.   tolterodine 4 MG 24 hr capsule Commonly known as: DETROL LA TAKE 1  CAPSULE BY MOUTH EVERY DAY   Vitamin D (Ergocalciferol) 1.25 MG (50000 UNIT) Caps capsule Commonly known as: DRISDOL Take 1 capsule (50,000 Units total) by mouth every 7 (seven) days.        Allergies: No Known Allergies  Past Medical History, Surgical history, Social history, and Family History were reviewed and updated.  Review of Systems: All other 10 point review of systems is negative.   Physical Exam:  height is 5\' 2"  (1.575 m) and weight is 169 lb 6.4 oz (76.8 kg). Her oral temperature is 98.8 F (37.1 C). Her blood pressure is 134/86 and her pulse is 93. Her respiration is 17 and oxygen saturation is 100%.   Wt Readings from Last 3 Encounters:  10/05/22 169 lb 6.4 oz (76.8 kg)  10/04/22 168 lb 12.8 oz (76.6 kg)  04/07/22 186 lb 9.6 oz (84.6 kg)    Ocular: Sclerae unicteric, pupils equal, round and reactive to light Ear-nose-throat: Oropharynx clear, dentition fair Lymphatic: No cervical or supraclavicular adenopathy Lungs no rales or rhonchi, good excursion bilaterally Heart regular rate and rhythm, no murmur appreciated Abd soft, nontender, positive bowel sounds MSK no focal spinal tenderness, no joint edema Neuro: non-focal, well-oriented, appropriate affect Breasts: Deferred   Lab Results  Component Value Date   WBC 14.3 (H) 10/05/2022   HGB 14.1 10/05/2022   HCT 39.9 10/05/2022   MCV 84.5 10/05/2022  PLT 395 10/05/2022   Lab Results  Component Value Date   FERRITIN 31 07/05/2021   IRON 134 07/05/2021   TIBC 418 07/05/2021   IRONPCTSAT 32 07/05/2021   Lab Results  Component Value Date   RBC 4.72 10/05/2022   No results found for: "KPAFRELGTCHN", "LAMBDASER", "KAPLAMBRATIO" Lab Results  Component Value Date   IGGSERUM 926 05/14/2019   No results found for: "TOTALPROTELP", "ALBUMINELP", "A1GS", "A2GS", "BETS", "BETA2SER", "GAMS", "MSPIKE", "SPEI"   Chemistry      Component Value Date/Time   NA 141 03/25/2022 1050   NA 138 06/10/2020 1210    K 4.5 03/25/2022 1050   CL 104 03/25/2022 1050   CO2 29 03/25/2022 1050   BUN 9 03/25/2022 1050   BUN 8 06/10/2020 1210   CREATININE 0.71 03/25/2022 1050      Component Value Date/Time   CALCIUM 10.0 03/25/2022 1050   ALKPHOS 61 03/25/2022 1050   AST 26 03/25/2022 1050   ALT 42 03/25/2022 1050   BILITOT 0.6 03/25/2022 1050       Impression and Plan: Ms. Thurner is a very pleasant 27 yo caucasian female with 4 year history of elevated WBC count felt to be reactive.  Work up was negative for any lymphoproliferative disease.  CBC again reviewed with Dr. Myna Hidalgo. Counts have remained stable.  At this time we w ill release her back to her PCP.  We can certainly see her again if needed for any future hem/onc issue that may arise.   Eileen Stanford, NP 8/14/202410:07 AM

## 2022-10-13 ENCOUNTER — Encounter: Payer: Self-pay | Admitting: Neurology

## 2022-10-13 ENCOUNTER — Ambulatory Visit (INDEPENDENT_AMBULATORY_CARE_PROVIDER_SITE_OTHER): Payer: 59 | Admitting: Neurology

## 2022-10-13 VITALS — BP 127/84 | HR 109 | Ht 62.0 in | Wt 169.0 lb

## 2022-10-13 DIAGNOSIS — D72820 Lymphocytosis (symptomatic): Secondary | ICD-10-CM

## 2022-10-13 DIAGNOSIS — Z79899 Other long term (current) drug therapy: Secondary | ICD-10-CM

## 2022-10-13 DIAGNOSIS — G43109 Migraine with aura, not intractable, without status migrainosus: Secondary | ICD-10-CM | POA: Diagnosis not present

## 2022-10-13 DIAGNOSIS — G35 Multiple sclerosis: Secondary | ICD-10-CM

## 2022-10-13 DIAGNOSIS — R5383 Other fatigue: Secondary | ICD-10-CM | POA: Diagnosis not present

## 2022-10-13 DIAGNOSIS — E559 Vitamin D deficiency, unspecified: Secondary | ICD-10-CM

## 2022-10-13 MED ORDER — SOLIFENACIN SUCCINATE 10 MG PO TABS: ORAL_TABLET | ORAL | 3 refills | Status: DC

## 2022-10-13 MED ORDER — PHENTERMINE HCL 30 MG PO CAPS: 30.0000 mg | ORAL_CAPSULE | ORAL | 5 refills | Status: DC

## 2022-10-13 NOTE — Progress Notes (Signed)
[facility]  PATIENT: Janet Duran DOB: [birth year]  REFERRING DOCTOR OR PCP: Theora Master, MD SOURCE: Patient, notes from Dr. Malvin Johns, imaging and lab reports, MRI images personally reviewed.  _________________________________   HISTORICAL  CHIEF COMPLAINT:  Chief Complaint  Patient presents with   Follow-up    Pt in room 10. Here for MS follow up. Pt reports MS is stable. Pt has noticed random bruise on arms and front of legs, just noticed this week.     HISTORY OF PRESENT ILLNESS:  Janet Duran is a 27 y.o. woman with relapsing remitting multiple sclerosis.  Update 10/13/2022: She is on Tysabri and tolerating well..   No exacerbations.  No new neurologic symptoms.   JCV Ab was negative in June 2024.   She has had elevated lymphocyte and saw hematology.  Flow cytometry was normal and this is likely due to the Tysabri therapy..    Currently, she reports her gait is doing fairly well with just mildly reduced balance.  She sometimes uses the rail on the stairs but often will use it anyhow for safety.  She keeps up with others on long walks. She has had some intermittent foot tingling.  No painful dysesthesias.  She denies weakness in her arms or legs but has some spasticity that has been sable since early in the disease course.      She notes urinary urgency improved with Detrol LA.  Co-pay is high.     When hot or tired some of the symptoms are worse.    Vision is doing well  She has fatigue, physical > cognitive     She sleeps well most nights..    She notes depression and anxiety, a little better this year.   She is on Lexapro    She notes mild brain fog and has had trouble coming up with the right word at times.    Phentermine helps fatigue a bit and she lost 10 pounds.    She works at [employer].     She is taking vitamin D supplements 50000 weekly.    She no longer takes B12 - levels were good earlier this yea  She has some  migraine headache.   These have not bothered her too much.  She occasionally takes rizatriptan with benefit most of the time.  Frequency is lower since BP better controlled.  In the past.  LFTs were elevated in 2022 but fine now   Hepatitis labs were fine.   She had an U/S and MRI and no pathology.  Her lymphocytes counts have been running high - likely due to natalizumab.    Weight is stable.      MS history: She was diagnosed with MS in March 2021 after presenting with fuzzy vision in both eyes and numbness in her right hand.   She had a migraine at the time.   Symptoms improved after 15 minutes.   She saw her PCP for suspected complicated migraine and was referred to Dr. Malvin Johns.   She had a brain MRI which was consistent with MS.   She also had a cervical spine MRI.   To confirm the diagnosis, she underwent an LP.  It showed oligoclonal bands confirming the diagnosis of MS.    She was started on Tysabri.   She is on Tysabri 300 mg every 6 weeks and tolerates it well.    She has not had not had any exacerbations.   Her next infusion  is scheduled for 01/24/2020.   Her last JCV antibody was 0.10 (negative)  08/07/2019.  IMAGING MRI of the cervical spine 07/20/2021 shows a T2 hyperintense focus to the left adjacent to C6.  This focus was acute on the 2021 MRI.  Another focus that had been mentioned at C4-C5 centrally is not seen on the current MRI.Marland Kitchen   Minimal degenerative changes at C4-C5.    MRI of the brain 08/02/2021 showed multiple T2/FLAIR hyperintense foci in the periventricular, juxtacortical and deep white matter of the cerebral hemispheres. Other foci are noted in the left lateral thalamus/internal capsule and in the right cerebellar hemisphere. Compared to the study dated 01/07/2020, there are no new lesions.   MRI of the brain 04/27/2019 showed multiple T2/FLAIR hyperintense foci in the hemispheres, many in the periventricular white matter radially oriented to the ventricles.  The foci was also  present in the lateral left thalamus, right pons and right cerebellum.  None of the foci enhanced.  MRI of the cervical spine showed abnormal T2 hyperintense foci to the left adjacent to C6 and centrally adjacent to C4-C5.  The C6 focus enhanced after contrast.  MRI 01/07/2020 showed T2/FLAIR hyperintense foci in the hemispheres, left thalamus and right cerebellar hemisphere in a pattern and configuration consistent with chronic demyelinating plaque associated with multiple sclerosis.  None of the foci appear to be acute.  They do not enhance.    There is a normal enhancement pattern.  LABS Lab work from 05/02/2019 was reviewed.  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  Family History: She is adopted.      REVIEW OF SYSTEMS: Constitutional: No fevers, chills, sweats, or change in appetite Eyes: No visual changes, double vision, eye pain Ear, nose and throat: No hearing loss, ear pain, nasal congestion, sore throat Cardiovascular: No chest pain, palpitations Respiratory:  No shortness of breath at rest or with exertion.   No wheezes GastrointestinaI: No nausea, vomiting, diarrhea, abdominal pain, fecal incontinence Genitourinary:  No dysuria, urinary retention or frequency.  No nocturia. Musculoskeletal:  No neck pain, back pain Integumentary: No rash, pruritus, skin lesions Neurological: as above Psychiatric: No depression at this time.  No anxiety Endocrine: No palpitations, diaphoresis, change in appetite, change in weigh or increased thirst Hematologic/Lymphatic:  No anemia, purpura, petechiae. Allergic/Immunologic: No itchy/runny eyes, nasal congestion,  recent allergic reactions, rashes  ALLERGIES: No Known Allergies  HOME MEDICATIONS:  Current Outpatient Medications:    albuterol (VENTOLIN HFA) 108 (90 Base) MCG/ACT inhaler, Inhale 1-2 puffs into the lungs every 6 (six) hours as needed for wheezing or shortness of breath., Disp: 6.7 g, Rfl: 0   benzoyl peroxide-erythromycin (BENZAMYCIN) gel, Apply topically 2 (two) times daily. Use once a day if causes excessive dryness, Disp: 46.6 g, Rfl: 1   escitalopram (LEXAPRO) 20 MG tablet, Take 1 tablet (20 mg total) by mouth daily., Disp: 90 tablet, Rfl: 3   natalizumab (TYSABRI) 300 MG/15ML injection, Inject into the vein., Disp: , Rfl:    norethindrone (MICRONOR) 0.35 MG tablet, Take 1 tablet (0.35 mg total) by mouth daily., Disp: 72 tablet, Rfl: 3   solifenacin (VESICARE) 10 MG tablet, One po qd, Disp: 90 tablet, Rfl: 3   Vitamin D, Ergocalciferol, (DRISDOL)  1.25 MG (50000 UNIT) CAPS capsule, Take 1 capsule (50,000 Units total) by mouth every 7 (seven) days., Disp: 12 capsule, Rfl: 0   cyanocobalamin (VITAMIN B12) 1000 MCG tablet, Take 1 tablet (1,000 mcg total) by mouth daily. (Patient not taking: Reported on 10/13/2022), Disp: 90 tablet, Rfl: 3   phentermine 30 MG capsule, Take 1 capsule (30 mg total) by mouth every morning., Disp: 30 capsule, Rfl: 5  PAST MEDICAL HISTORY: Past Medical History:  Diagnosis Date   Anxiety    Depression    Ectopic beat    Ectopic heartbeat    Hypertension    Less than [redacted] weeks gestation of pregnancy 09/27/2021   Migraine    Multiple sclerosis (HCC)    Urinary urgency 12/26/2019    PAST SURGICAL HISTORY: Past Surgical History:  Procedure Laterality Date   NO PAST SURGERIES      FAMILY HISTORY: Family History  Adopted: Yes    SOCIAL HISTORY:  Social History   Socioeconomic History   Marital status: Single    Spouse name: Not on file   Number of children: 0   Years of education: Bachelors    Highest education level: Not on file   Occupational History   Occupation: [place] [place] [employer]   Tobacco Use   Smoking status: Never   Smokeless tobacco: Never  Vaping Use   Vaping status: Never Used  Substance and Sexual Activity   Alcohol use: Yes    Comment: rare   Drug use: Never   Sexual activity: Yes    Birth control/protection: Pill  Other Topics Concern   Not on file  Social History Narrative   Right handed    Lives with boyfriend   Caffeine use: Soda daily ( 1 per day)   Social Determinants of Health   Financial Resource Strain: Low Risk  (03/25/2022)   Overall Financial Resource Strain (CARDIA)    Difficulty of Paying Living Expenses: Not hard at all  Food Insecurity: No Food Insecurity (03/25/2022)   Hunger Vital Sign    Worried About Running Out of Food in the Last Year: Never true    Ran Out of Food in the Last Year: Never true  Transportation Needs: No Transportation Needs (03/25/2022)   PRAPARE - Administrator, Civil Service (Medical): No    Lack of Transportation (Non-Medical): No  Physical Activity: Insufficiently Active (03/25/2022)   Exercise Vital Sign    Days of Exercise per Week: 2 days    Minutes of Exercise per Session: 30 min  Stress: No Stress Concern Present (03/25/2022)   [employer] of Occupational Health - Occupational Stress Questionnaire    Feeling of Stress : Not at all  Social Connections: Unknown (07/06/2021)   Received from [facility]   Social Network    Social Network: Not on file  Intimate Partner Violence: Not At Risk (03/25/2022)   Humiliation, Afraid, Rape, and Kick questionnaire    Fear of Current or Ex-Partner: No    Emotionally Abused: No    Physically Abused: No    Sexually Abused: No     PHYSICAL EXAM  Vitals:   10/13/22 1355  BP: 127/84  Pulse: (!) 109  Weight: 169 lb (76.7 kg)  Height: 5\' 2"  (1.575 m)    Body mass index is 30.91 kg/m.  No results found.  General: The patient is well-developed and well-nourished and  in no acute distress  HEENT:  Head is /AT.  Sclera are anicteric.  Neck:  The neck is nontender.   Skin: Extremities are without rash or  edema.    Neurologic Exam  Mental status: The patient is alert and oriented x 3 at the time of the examination. The patient has apparent normal recent and remote memory, with an apparently normal attention span and concentration ability.   Speech is normal.  Cranial nerves: Extraocular movements are full. Facial strength and sensation are normal.  No obvious hearing deficits are noted.  Motor:  Muscle bulk is normal.   Tone is normal. Strength is  5 / 5 in all 4 extremities.   Sensory: Sensory testing is intact to pinprick, soft touch and vibration sensation in all 4 extremities.  Coordination: Cerebellar testing reveals good finger-nose-finger and heel-to-shin bilaterally.  Gait and station: Station is normal.   Gait was normal.  Tandem gait is minimally wide.  The Romberg is negative.  Reflexes: Deep tendon reflexes are symmetric and normal bilaterally.       DIAGNOSTIC DATA (LABS, IMAGING, TESTING) - I reviewed patient records, labs, notes, testing and imaging myself where available.  Lab Results  Component Value Date   WBC 14.3 (H) 10/05/2022   HGB 14.1 10/05/2022   HCT 39.9 10/05/2022   MCV 84.5 10/05/2022   PLT 395 10/05/2022      Component Value Date/Time   NA 141 10/05/2022 0943   NA 138 06/10/2020 1210   K 3.9 10/05/2022 0943   CL 104 10/05/2022 0943   CO2 29 10/05/2022 0943   GLUCOSE 119 (H) 10/05/2022 0943   BUN 12 10/05/2022 0943   BUN 8 06/10/2020 1210   CREATININE 0.83 10/05/2022 0943   CALCIUM 9.6 10/05/2022 0943   PROT 7.3 10/05/2022 0943   PROT 7.5 12/23/2020 1327   ALBUMIN 5.0 10/05/2022 0943   ALBUMIN 5.1 (H) 12/23/2020 1327   AST 14 (L) 10/05/2022 0943   ALT 22 10/05/2022 0943   ALKPHOS 45 10/05/2022 0943   BILITOT 0.6 10/05/2022 0943   GFRNONAA >60 10/05/2022 0943   GFRAA 147 02/01/2019 1543    Lab Results  Component Value Date   CHOL 195 10/04/2022   HDL 30.80 (L) 10/04/2022   LDLCALC 145 (H) 10/04/2022   LDLDIRECT 125.0 04/05/2022   TRIG 97.0 10/04/2022   CHOLHDL 6 10/04/2022    Lab Results  Component Value Date   TSH 1.20 07/05/2021       ASSESSMENT AND PLAN  Multiple sclerosis (HCC)  High risk medication use  Other fatigue  Migraine with aura and without status migrainosus, not intractable  Elevated lymphocyte count  Vitamin D deficiency    1.  Continue Tysabri 300 mg every 4 weeks and check JCV q 6 months or so  2.    Continue phentermine to help with weight loss and fatigue 3.   Stay active and exercise as tolerated. 4.  Change Detrol to Vesicare as co-pay will be less.  5.    rtc 6 months  Marjean Imperato A. Epimenio Foot, MD, Edwin Cap 10/13/2022, 8:07 PM Certified in Neurology, Clinical Neurophysiology, Sleep Medicine and Neuroimaging  [facility] [address], [address] [place], [place] [address] [phone]

## 2022-10-25 ENCOUNTER — Ambulatory Visit: Payer: 59 | Admitting: Nurse Practitioner

## 2022-10-27 ENCOUNTER — Encounter: Payer: Self-pay | Admitting: Neurology

## 2022-10-27 ENCOUNTER — Telehealth: Payer: Self-pay

## 2022-10-27 NOTE — Telephone Encounter (Signed)
LVM letting pt know that her letter is in her chart

## 2022-10-27 NOTE — Telephone Encounter (Signed)
Pt called and spoke with Khadijah S. regarding this call.

## 2022-10-27 NOTE — Telephone Encounter (Signed)
Called pt and let her know that Dr. Epimenio Foot said he reconmmends that she does not get the yellow fever vaccine and that he can write her an exempt letter so she doesn't have to get the yellow fever vaccine. Pt stated that she would like a exemption letter.

## 2022-10-27 NOTE — Telephone Encounter (Signed)
Is this something you are willing to do?

## 2022-10-27 NOTE — Telephone Encounter (Signed)
Pt called wanting to know if this can be done today since the letter is needed as soon as possible. Please advise.

## 2022-10-30 ENCOUNTER — Other Ambulatory Visit: Payer: Self-pay | Admitting: Nurse Practitioner

## 2022-10-30 DIAGNOSIS — F331 Major depressive disorder, recurrent, moderate: Secondary | ICD-10-CM

## 2022-11-08 ENCOUNTER — Encounter: Payer: Self-pay | Admitting: *Deleted

## 2022-11-24 ENCOUNTER — Other Ambulatory Visit (HOSPITAL_COMMUNITY)
Admission: RE | Admit: 2022-11-24 | Discharge: 2022-11-24 | Disposition: A | Discharge status: Home / Self Care | Payer: 59 | Source: Ambulatory Visit | Attending: Nurse Practitioner | Admitting: Nurse Practitioner

## 2022-11-24 ENCOUNTER — Encounter: Payer: Self-pay | Admitting: Nurse Practitioner

## 2022-11-24 ENCOUNTER — Ambulatory Visit: Payer: 59 | Admitting: Nurse Practitioner

## 2022-11-24 VITALS — BP 120/79 | HR 83 | Temp 98.0°F | Ht 62.0 in | Wt 167.2 lb

## 2022-11-24 DIAGNOSIS — Z1159 Encounter for screening for other viral diseases: Secondary | ICD-10-CM | POA: Diagnosis not present

## 2022-11-24 NOTE — Patient Instructions (Signed)
Go to lab

## 2022-11-24 NOTE — Progress Notes (Signed)
Acute Office Visit  Subjective:    Patient ID: Janet Duran, female    DOB: [birth year], 27 y.o.   MRN: [id number]  Chief Complaint  Patient presents with   Gynecologic Exam    Pt wants an std screening. She denies symptoms. Declined flu vaccine    Gynecologic Exam   Patient is in today for STD screen. She denies any vaginal or systemic symptoms. Also denies any known exposure.  Outpatient Medications Prior to Visit  Medication Sig   albuterol (VENTOLIN HFA) 108 (90 Base) MCG/ACT inhaler Inhale 1-2 puffs into the lungs every 6 (six) hours as needed for wheezing or shortness of breath.   benzoyl peroxide-erythromycin (BENZAMYCIN) gel Apply topically 2 (two) times daily. Use once a day if causes excessive dryness   escitalopram (LEXAPRO) 20 MG tablet Take 1 tablet (20 mg total) by mouth daily.   natalizumab (TYSABRI) 300 MG/15ML injection Inject into the vein.   norethindrone (MICRONOR) 0.35 MG tablet Take 1 tablet (0.35 mg total) by mouth daily.   phentermine 30 MG capsule Take 1 capsule (30 mg total) by mouth every morning.   solifenacin (VESICARE) 10 MG tablet One po qd   Vitamin D, Ergocalciferol, (DRISDOL) 1.25 MG (50000 UNIT) CAPS capsule Take 1 capsule (50,000 Units total) by mouth every 7 (seven) days.   [DISCONTINUED] cyanocobalamin (VITAMIN B12) 1000 MCG tablet Take 1 tablet (1,000 mcg total) by mouth daily. (Patient not taking: Reported on 10/13/2022)   No facility-administered medications prior to visit.    Reviewed past medical and social history.  Review of Systems Per HPI     Objective:    Physical Exam Vitals and nursing note reviewed.  Cardiovascular:     Rate and Rhythm: Normal rate.     Pulses: Normal pulses.  Pulmonary:     Effort: Pulmonary effort is normal.  Neurological:     Mental Status: She is alert and oriented to person, place, and time.  Psychiatric:        Mood and Affect: Mood normal.        Behavior: Behavior normal.         Thought Content: Thought content normal.    BP 120/79   Pulse 83   Temp 98 F (36.7 C) (Temporal)   Ht 5\' 2"  (1.575 m)   Wt 167 lb 3.2 oz (75.8 kg)   SpO2 99%   BMI 30.58 kg/m    No results found for any visits on 11/24/22.     Assessment & Plan:   Problem List Items Addressed This Visit   None Visit Diagnoses     Encounter for screening for viral disease    -  Primary   Relevant Orders   HIV Antibody (routine testing w rflx)   RPR   Urine cytology ancillary only   Hepatitis C antibody        No orders of the defined types were placed in this encounter.  Return if symptoms worsen or fail to improve.    Alysia Penna, NP

## 2022-11-24 NOTE — Progress Notes (Deleted)
Established Patient Visit  Patient: Janet Duran   DOB: [birth year]   27 y.o. Female  MRN: [id number] Visit Date: 11/24/2022  Subjective:    Chief Complaint  Patient presents with   Gynecologic Exam    Pt wants an std screening. She denies symptoms. Declined flu vaccine   HPI No problem-specific Assessment & Plan notes found for this encounter.  BP Readings from Last 3 Encounters:  11/24/22 120/79  10/13/22 127/84  10/05/22 134/86    Wt Readings from Last 3 Encounters:  11/24/22 167 lb 3.2 oz (75.8 kg)  10/13/22 169 lb (76.7 kg)  10/05/22 169 lb 6.4 oz (76.8 kg)    Reviewed medical, surgical, and social history today  Medications: Outpatient Medications Prior to Visit  Medication Sig   albuterol (VENTOLIN HFA) 108 (90 Base) MCG/ACT inhaler Inhale 1-2 puffs into the lungs every 6 (six) hours as needed for wheezing or shortness of breath.   benzoyl peroxide-erythromycin (BENZAMYCIN) gel Apply topically 2 (two) times daily. Use once a day if causes excessive dryness   escitalopram (LEXAPRO) 20 MG tablet Take 1 tablet (20 mg total) by mouth daily.   natalizumab (TYSABRI) 300 MG/15ML injection Inject into the vein.   norethindrone (MICRONOR) 0.35 MG tablet Take 1 tablet (0.35 mg total) by mouth daily.   phentermine 30 MG capsule Take 1 capsule (30 mg total) by mouth every morning.   solifenacin (VESICARE) 10 MG tablet One po qd   Vitamin D, Ergocalciferol, (DRISDOL) 1.25 MG (50000 UNIT) CAPS capsule Take 1 capsule (50,000 Units total) by mouth every 7 (seven) days.   cyanocobalamin (VITAMIN B12) 1000 MCG tablet Take 1 tablet (1,000 mcg total) by mouth daily. (Patient not taking: Reported on 10/13/2022)   No facility-administered medications prior to visit.   Reviewed past medical and social history.   ROS per HPI above  {Show previous labs (optional):23779}    Objective:  BP 120/79   Pulse 83   Temp 98 F (36.7 C) (Temporal)   Ht 5\' 2"  (1.575 m)    Wt 167 lb 3.2 oz (75.8 kg)   SpO2 99%   BMI 30.58 kg/m      Physical Exam  No results found for any visits on 11/24/22.    Assessment & Plan:    Problem List Items Addressed This Visit   None  No follow-ups on file.     Alysia Penna, NP

## 2022-11-25 LAB — HIV ANTIBODY (ROUTINE TESTING W REFLEX): HIV 1&2 Ab, 4th Generation: NONREACTIVE

## 2022-11-25 LAB — RPR: RPR Ser Ql: NONREACTIVE

## 2022-11-25 LAB — HEPATITIS C ANTIBODY: Hepatitis C Ab: NONREACTIVE

## 2022-11-28 ENCOUNTER — Other Ambulatory Visit: Payer: Self-pay

## 2022-11-28 ENCOUNTER — Other Ambulatory Visit: Payer: Self-pay | Admitting: *Deleted

## 2022-11-28 ENCOUNTER — Telehealth: Payer: Self-pay | Admitting: *Deleted

## 2022-11-28 DIAGNOSIS — G35 Multiple sclerosis: Secondary | ICD-10-CM

## 2022-11-28 DIAGNOSIS — Z79899 Other long term (current) drug therapy: Secondary | ICD-10-CM

## 2022-11-28 LAB — URINE CYTOLOGY ANCILLARY ONLY
Chlamydia: NEGATIVE
Comment: NEGATIVE
Comment: NEGATIVE
Comment: NORMAL
Neisseria Gonorrhea: NEGATIVE
Trichomonas: NEGATIVE

## 2022-11-28 NOTE — Telephone Encounter (Signed)
Placed JCV lab in [facility] lock box for routine lab pick up. Results pending. 

## 2022-11-29 LAB — CBC WITH DIFFERENTIAL/PLATELET
Basophils Absolute: 0.1 10*3/uL (ref 0.0–0.2)
Basos: 1 %
EOS (ABSOLUTE): 0.3 10*3/uL (ref 0.0–0.4)
Eos: 2 %
Hematocrit: 40.8 % (ref 34.0–46.6)
Hemoglobin: 13.6 g/dL (ref 11.1–15.9)
Immature Grans (Abs): 0 10*3/uL (ref 0.0–0.1)
Immature Granulocytes: 0 %
Lymphocytes Absolute: 7.1 10*3/uL — ABNORMAL HIGH (ref 0.7–3.1)
Lymphs: 51 %
MCH: 29.4 pg (ref 26.6–33.0)
MCHC: 33.3 g/dL (ref 31.5–35.7)
MCV: 88 fL (ref 79–97)
Monocytes Absolute: 1 10*3/uL — ABNORMAL HIGH (ref 0.1–0.9)
Monocytes: 7 %
Neutrophils Absolute: 5.4 10*3/uL (ref 1.4–7.0)
Neutrophils: 39 %
Platelets: 421 10*3/uL (ref 150–450)
RBC: 4.63 x10E6/uL (ref 3.77–5.28)
RDW: 13 % (ref 11.7–15.4)
WBC: 14 10*3/uL — ABNORMAL HIGH (ref 3.4–10.8)

## 2022-12-06 ENCOUNTER — Telehealth: Payer: Self-pay

## 2022-12-06 NOTE — Telephone Encounter (Signed)
Faxed Tysabri form to [employer] at [phone] on 12/06/2022

## 2022-12-28 ENCOUNTER — Ambulatory Visit: Payer: 59 | Admitting: Nurse Practitioner

## 2022-12-28 VITALS — BP 106/80 | HR 86 | Temp 98.4°F | Ht 62.0 in | Wt 161.4 lb

## 2022-12-28 DIAGNOSIS — J029 Acute pharyngitis, unspecified: Secondary | ICD-10-CM

## 2022-12-28 DIAGNOSIS — H6690 Otitis media, unspecified, unspecified ear: Secondary | ICD-10-CM

## 2022-12-28 DIAGNOSIS — N926 Irregular menstruation, unspecified: Secondary | ICD-10-CM

## 2022-12-28 LAB — POC COVID19 BINAXNOW: SARS Coronavirus 2 Ag: NEGATIVE

## 2022-12-28 LAB — POCT INFLUENZA A/B
Influenza A, POC: NEGATIVE
Influenza B, POC: NEGATIVE

## 2022-12-28 LAB — POCT URINE PREGNANCY: Preg Test, Ur: NEGATIVE

## 2022-12-28 MED ORDER — AMOXICILLIN-POT CLAVULANATE 875-125 MG PO TABS: 1.0000 | ORAL_TABLET | Freq: Two times a day (BID) | ORAL | 0 refills | Status: DC

## 2022-12-28 NOTE — Progress Notes (Signed)
Acute Office Visit  Subjective:     Patient ID: Janet Duran, female    DOB: 04-Mar-1995, 27 y.o.   MRN: [id number]  Chief Complaint  Patient presents with   Sore Throat    With stuffy nose, bilateral ear pain with itching, muscle aches, request pregnancy test    Sore Throat    Patient is in today for nasal congestion, ear pain, and muscle aches.   Discussed the use of AI scribe software for clinical note transcription with the patient, who gave verbal consent to proceed.  History of Present Illness   The patient presents with a 2-3 day history of upper respiratory symptoms including a sore throat, stuffy nose, and itchy ears. She also reports body aches and mild coughing. She denies fever and exposure to sick contacts. She has not taken any medications for symptom relief. The patient also reports headaches. She has not done a COVID test at home. She has a history of recurrent ear infections typically associated with illness.  In addition, she reports a late menstrual period with spotting. She was concerned about a potential pregnancy, and would like a pregnancy test today.     ROS See pertinent positives and negatives per HPI.     Objective:    BP 106/80 (BP Location: Left Arm)   Pulse 86   Temp 98.4 F (36.9 C) (Oral)   Ht 5\' 2"  (1.575 m)   Wt 161 lb 6.4 oz (73.2 kg)   LMP 11/22/2022 (Exact Date)   SpO2 99%   BMI 29.52 kg/m    Physical Exam Vitals and nursing note reviewed.  Constitutional:      General: She is not in acute distress.    Appearance: Normal appearance.  HENT:     Head: Normocephalic.     Right Ear: Tympanic membrane, ear canal and external ear normal.     Left Ear: External ear normal. Tympanic membrane is erythematous.     Nose:     Right Sinus: No maxillary sinus tenderness or frontal sinus tenderness.     Left Sinus: No maxillary sinus tenderness or frontal sinus tenderness.     Mouth/Throat:     Mouth: Mucous membranes are moist.      Pharynx: Posterior oropharyngeal erythema present. No oropharyngeal exudate.  Eyes:     Conjunctiva/sclera: Conjunctivae normal.  Cardiovascular:     Rate and Rhythm: Normal rate and regular rhythm.     Pulses: Normal pulses.     Heart sounds: Normal heart sounds.  Pulmonary:     Effort: Pulmonary effort is normal.     Breath sounds: Normal breath sounds.  Musculoskeletal:     Cervical back: Normal range of motion and neck supple. No tenderness.  Lymphadenopathy:     Cervical: No cervical adenopathy.  Skin:    General: Skin is warm.  Neurological:     General: No focal deficit present.     Mental Status: She is alert and oriented to person, place, and time.  Psychiatric:        Mood and Affect: Mood normal.        Behavior: Behavior normal.        Thought Content: Thought content normal.        Judgment: Judgment normal.     Results for orders placed or performed in visit on 12/28/22  POCT urine pregnancy  Result Value Ref Range   Preg Test, Ur Negative Negative  POCT Influenza A/B  Result Value Ref  Range   Influenza A, POC Negative Negative   Influenza B, POC Negative Negative  POC COVID-19 BinaxNow  Result Value Ref Range   SARS Coronavirus 2 Ag Negative Negative        Assessment & Plan:   Assessment and Plan    Otitis Media Noted in left ear. Will start augmentin BID x7 days. She can alternate tylenol and ibuprofen as needed for pain.    Sore Throat POC Flu and Covid-19 negative. Encourage fluids, rest. Can take flonase and alternate tylenol and ibuprofen as needed for pain. Follow-up if not improving.   Late Menstrual Period   Her pregnancy test is negative. We will continue to monitor her menstrual cycle.      Meds ordered this encounter  Medications   amoxicillin-clavulanate (AUGMENTIN) 875-125 MG tablet    Sig: Take 1 tablet by mouth 2 (two) times daily.    Dispense:  14 tablet    Refill:  0    Return if symptoms worsen or fail to  improve.  Gerre Scull, NP

## 2022-12-28 NOTE — Patient Instructions (Signed)
It was great to see you!  Start augmentin 1 tablet twice a day with food for your ear infection.   Drink plenty of fluids and get rest  You can take ibuprofen or tylenol as needed for pain  You can use flonase or allergy medication as needed.   Let's follow-up if your symptoms worsen or any concerns.   Take care,  Rodman Pickle, NP

## 2023-01-04 ENCOUNTER — Other Ambulatory Visit: Payer: Self-pay | Admitting: Neurology

## 2023-01-04 NOTE — Telephone Encounter (Signed)
Last seen on 10/13/22 Follow up scheduled on 05/02/23

## 2023-01-05 ENCOUNTER — Ambulatory Visit: Payer: 59 | Admitting: Nurse Practitioner

## 2023-01-05 ENCOUNTER — Encounter: Payer: Self-pay | Admitting: Nurse Practitioner

## 2023-01-05 VITALS — BP 131/88 | HR 66 | Temp 98.4°F | Resp 18 | Ht 62.0 in | Wt 167.6 lb

## 2023-01-05 DIAGNOSIS — K760 Fatty (change of) liver, not elsewhere classified: Secondary | ICD-10-CM | POA: Diagnosis not present

## 2023-01-05 DIAGNOSIS — R739 Hyperglycemia, unspecified: Secondary | ICD-10-CM

## 2023-01-05 DIAGNOSIS — F331 Major depressive disorder, recurrent, moderate: Secondary | ICD-10-CM

## 2023-01-05 DIAGNOSIS — E781 Pure hyperglyceridemia: Secondary | ICD-10-CM | POA: Diagnosis not present

## 2023-01-05 LAB — HEMOGLOBIN A1C: Hgb A1c MFr Bld: 5.5 % (ref 4.6–6.5)

## 2023-01-05 LAB — LIPID PANEL
Cholesterol: 220 mg/dL — ABNORMAL HIGH (ref 0–200)
HDL: 44.3 mg/dL (ref >39.00)
LDL Cholesterol: 159 mg/dL — ABNORMAL HIGH (ref 0–99)
NonHDL: 175.84
Total CHOL/HDL Ratio: 5
Triglycerides: 85 mg/dL (ref 0.0–149.0)
VLDL: 17 mg/dL (ref 0.0–40.0)

## 2023-01-05 NOTE — Assessment & Plan Note (Signed)
No change in mood with Lexapro 20mg . She declined need for therapy sessions and change in med at this time F/up in 3months

## 2023-01-05 NOTE — Progress Notes (Signed)
Established Patient Visit  Patient: Janet Duran   DOB: Jun 22, 1995   27 y.o. Female  MRN: [id number] Visit Date: 01/05/2023  Subjective:    Chief Complaint  Patient presents with   OFFICE VISIT     3 month follow up    Depression    Anxiety    Hyperlipidemia   Hypertension   HPI Hypertriglyceridemia Repeat lipid panel Denies any adverse effects with fenofibrate  Depression No change in mood with Lexapro 20mg . She declined need for therapy sessions and change in med at this time F/up in 3months   Hepatic steatosis Per MRI ABDOMEN 02/2021: Hyperenhancing 5 mm focus in the inferior aspect of the right lobe of the liver, corresponding with the lesion seen on prior ultrasound. While the focus demonstrates nonspecific imaging characteristics it almost certainly reflects a benign finding with primary differential considerations including focal nodular hyperplasia or hepatic adenoma and less likely considerations including intravascular shunt or hepatic hemangioma. Consider assessment of stability and possible further characterization with follow-up hepatic protocol abdominal MRI in 6 months with and without EOVIST IV contrast. Diffuse hepatic Steatosis.     Latest Ref Rng & Units 10/05/2022    9:43 AM 03/25/2022   10:50 AM 07/05/2021    2:10 PM  Hepatic Function  Total Protein 6.5 - 8.1 g/dL 7.3  7.5  7.5   Albumin 3.5 - 5.0 g/dL 5.0  4.8  4.8   AST 15 - 41 U/L 14  26  20    ALT 0 - 44 U/L 22  42  29   Alk Phosphatase 38 - 126 U/L 45  61  60   Total Bilirubin 0.3 - 1.2 mg/dL 0.6  0.6  0.8         11/91/4782    8:48 AM 10/04/2022    9:33 AM 03/25/2022   11:04 AM  Depression screen PHQ 2/9  Decreased Interest 2 3 1   Down, Depressed, Hopeless 2 3 1   PHQ - 2 Score 4 6 2   Altered sleeping 3 3   Tired, decreased energy 3 3   Change in appetite 3 2   Feeling bad or failure about yourself  1 1   Trouble concentrating 3 3   Moving slowly or fidgety/restless 2  1   Suicidal thoughts 0 0   PHQ-9 Score 19 19   Difficult doing work/chores Extremely dIfficult Extremely dIfficult        01/05/2023    8:49 AM 10/04/2022    9:33 AM 01/31/2022   10:06 AM 01/03/2022    9:37 AM  GAD 7 : Generalized Anxiety Score  Nervous, Anxious, on Edge 2 3 2 3   Control/stop worrying 2 3 1 3   Worry too much - different things 2 3 2 3   Trouble relaxing 2 3 1 3   Restless 1 3 1 1   Easily annoyed or irritable 3 3 2 3   Afraid - awful might happen 1 3 1 2   Total GAD 7 Score 13 21 10 18   Anxiety Difficulty Extremely difficult Extremely difficult Very difficult Extremely difficult   Reviewed medical, surgical, and social history today  Medications: Outpatient Medications Prior to Visit  Medication Sig   albuterol (VENTOLIN HFA) 108 (90 Base) MCG/ACT inhaler Inhale 1-2 puffs into the lungs every 6 (six) hours as needed for wheezing or shortness of breath.   benzoyl peroxide-erythromycin (BENZAMYCIN) gel Apply topically 2 (two) times daily.  Use once a day if causes excessive dryness   escitalopram (LEXAPRO) 20 MG tablet Take 1 tablet (20 mg total) by mouth daily.   natalizumab (TYSABRI) 300 MG/15ML injection Inject into the vein.   norethindrone (MICRONOR) 0.35 MG tablet Take 1 tablet (0.35 mg total) by mouth daily.   phentermine 30 MG capsule Take 1 capsule (30 mg total) by mouth every morning.   solifenacin (VESICARE) 10 MG tablet TAKE 1 TABLET BY MOUTH EVERY DAY   Vitamin D, Ergocalciferol, (DRISDOL) 1.25 MG (50000 UNIT) CAPS capsule Take 1 capsule (50,000 Units total) by mouth every 7 (seven) days.   [DISCONTINUED] amoxicillin-clavulanate (AUGMENTIN) 875-125 MG tablet Take 1 tablet by mouth 2 (two) times daily.   [DISCONTINUED] fenofibrate (TRICOR) 145 MG tablet Take 145 mg by mouth daily.   No facility-administered medications prior to visit.   Reviewed past medical and social history.   ROS per HPI above      Objective:  BP 131/88 (BP Location: Left Arm,  Patient Position: Sitting, Cuff Size: Normal)   Pulse 66   Temp 98.4 F (36.9 C) (Temporal)   Resp 18   Ht 5\' 2"  (1.575 m)   Wt 167 lb 9.6 oz (76 kg)   LMP 11/22/2022 (Exact Date)   SpO2 99%   BMI 30.65 kg/m      Physical Exam Vitals and nursing note reviewed.  Cardiovascular:     Rate and Rhythm: Normal rate and regular rhythm.     Pulses: Normal pulses.     Heart sounds: Normal heart sounds.  Pulmonary:     Effort: Pulmonary effort is normal.     Breath sounds: Normal breath sounds.  Neurological:     Mental Status: She is alert and oriented to person, place, and time.     Results for orders placed or performed in visit on 01/05/23  Hemoglobin A1c  Result Value Ref Range   Hgb A1c MFr Bld 5.5 4.6 - 6.5 %  Lipid panel  Result Value Ref Range   Cholesterol 220 (H) 0 - 200 mg/dL   Triglycerides 96.0 0.0 - 149.0 mg/dL   HDL 45.40 >98.11 mg/dL   VLDL 91.4 0.0 - 78.2 mg/dL   LDL Cholesterol 956 (H) 0 - 99 mg/dL   Total CHOL/HDL Ratio 5    NonHDL 175.84       Assessment & Plan:    Problem List Items Addressed This Visit     Depression - Primary    No change in mood with Lexapro 20mg . She declined need for therapy sessions and change in med at this time F/up in 3months       Hepatic steatosis    Per MRI ABDOMEN 02/2021: Hyperenhancing 5 mm focus in the inferior aspect of the right lobe of the liver, corresponding with the lesion seen on prior ultrasound. While the focus demonstrates nonspecific imaging characteristics it almost certainly reflects a benign finding with primary differential considerations including focal nodular hyperplasia or hepatic adenoma and less likely considerations including intravascular shunt or hepatic hemangioma. Consider assessment of stability and possible further characterization with follow-up hepatic protocol abdominal MRI in 6 months with and without EOVIST IV contrast. Diffuse hepatic Steatosis.     Latest Ref Rng & Units 10/05/2022     9:43 AM 03/25/2022   10:50 AM 07/05/2021    2:10 PM  Hepatic Function  Total Protein 6.5 - 8.1 g/dL 7.3  7.5  7.5   Albumin 3.5 - 5.0 g/dL 5.0  4.8  4.8   AST 15 - 41 U/L 14  26  20    ALT 0 - 44 U/L 22  42  29   Alk Phosphatase 38 - 126 U/L 45  61  60   Total Bilirubin 0.3 - 1.2 mg/dL 0.6  0.6  0.8          Hyperglycemia   Relevant Orders   Hemoglobin A1c (Completed)   Hypertriglyceridemia    Repeat lipid panel Denies any adverse effects with fenofibrate      Relevant Orders   Lipid panel (Completed)   Return in about 3 months (around 04/07/2023) for depression and anxiety, hyperglycemia, hyperlipidemia (fasting).     Alysia Penna, NP

## 2023-01-05 NOTE — Patient Instructions (Signed)
Schedule appointment with therapist Maintain current med doses Go to lab

## 2023-01-05 NOTE — Assessment & Plan Note (Addendum)
Repeat lipid panel Denies any adverse effects with fenofibrate

## 2023-01-05 NOTE — Assessment & Plan Note (Addendum)
Per MRI ABDOMEN 02/2021: Hyperenhancing 5 mm focus in the inferior aspect of the right lobe of the liver, corresponding with the lesion seen on prior ultrasound. While the focus demonstrates nonspecific imaging characteristics it almost certainly reflects a benign finding with primary differential considerations including focal nodular hyperplasia or hepatic adenoma and less likely considerations including intravascular shunt or hepatic hemangioma. Consider assessment of stability and possible further characterization with follow-up hepatic protocol abdominal MRI in 6 months with and without EOVIST IV contrast. Diffuse hepatic Steatosis.     Latest Ref Rng & Units 10/05/2022    9:43 AM 03/25/2022   10:50 AM 07/05/2021    2:10 PM  Hepatic Function  Total Protein 6.5 - 8.1 g/dL 7.3  7.5  7.5   Albumin 3.5 - 5.0 g/dL 5.0  4.8  4.8   AST 15 - 41 U/L 14  26  20    ALT 0 - 44 U/L 22  42  29   Alk Phosphatase 38 - 126 U/L 45  61  60   Total Bilirubin 0.3 - 1.2 mg/dL 0.6  0.6  0.8

## 2023-01-19 ENCOUNTER — Encounter: Payer: Self-pay | Admitting: Neurology

## 2023-02-20 ENCOUNTER — Telehealth: Payer: Self-pay

## 2023-02-20 ENCOUNTER — Other Ambulatory Visit: Payer: Self-pay

## 2023-02-20 DIAGNOSIS — G35 Multiple sclerosis: Secondary | ICD-10-CM

## 2023-02-20 NOTE — Telephone Encounter (Signed)
Placed JCV in [facility] on 02/20/2023

## 2023-02-21 LAB — CBC WITH DIFFERENTIAL/PLATELET
Basophils Absolute: 0.1 10*3/uL (ref 0.0–0.2)
Basos: 1 %
EOS (ABSOLUTE): 0.3 10*3/uL (ref 0.0–0.4)
Eos: 3 %
Hematocrit: 38.3 % (ref 34.0–46.6)
Hemoglobin: 12.9 g/dL (ref 11.1–15.9)
Immature Grans (Abs): 0 10*3/uL (ref 0.0–0.1)
Immature Granulocytes: 0 %
Lymphocytes Absolute: 5.2 10*3/uL — ABNORMAL HIGH (ref 0.7–3.1)
Lymphs: 43 %
MCH: 29.7 pg (ref 26.6–33.0)
MCHC: 33.7 g/dL (ref 31.5–35.7)
MCV: 88 fL (ref 79–97)
Monocytes Absolute: 1 10*3/uL — ABNORMAL HIGH (ref 0.1–0.9)
Monocytes: 8 %
Neutrophils Absolute: 5.4 10*3/uL (ref 1.4–7.0)
Neutrophils: 45 %
Platelets: 413 10*3/uL (ref 150–450)
RBC: 4.34 x10E6/uL (ref 3.77–5.28)
RDW: 12.6 % (ref 11.7–15.4)
WBC: 12 10*3/uL — ABNORMAL HIGH (ref 3.4–10.8)

## 2023-03-16 ENCOUNTER — Ambulatory Visit
Admission: RE | Admit: 2023-03-16 | Discharge: 2023-03-16 | Disposition: A | Discharge status: Home / Self Care | Payer: 59 | Source: Ambulatory Visit | Attending: Emergency Medicine | Admitting: Emergency Medicine

## 2023-03-16 VITALS — BP 145/79 | HR 113 | Temp 99.9°F | Resp 15

## 2023-03-16 DIAGNOSIS — J111 Influenza due to unidentified influenza virus with other respiratory manifestations: Secondary | ICD-10-CM | POA: Diagnosis not present

## 2023-03-16 LAB — POC COVID19/FLU A&B COMBO
Covid Antigen, POC: NEGATIVE
Influenza A Antigen, POC: NEGATIVE
Influenza B Antigen, POC: NEGATIVE

## 2023-03-16 MED ORDER — IPRATROPIUM BROMIDE 0.06 % NA SOLN: 2.0000 | Freq: Three times a day (TID) | NASAL | 1 refills | Status: DC

## 2023-03-16 MED ORDER — PROMETHAZINE-DM 6.25-15 MG/5ML PO SYRP: 5.0000 mL | ORAL_SOLUTION | Freq: Four times a day (QID) | ORAL | 0 refills | Status: DC | PRN

## 2023-03-16 NOTE — Discharge Instructions (Addendum)
Your rapid influenza antigen test today was negative.  No further influenza testing is indicated.     Your COVID-19 test is negative.  Please consider retesting in the next 2 to 3 days, particularly if you are not feeling any better.  You are welcome to return here to urgent care to have it done or you can take a home COVID-19 test.     If both your COVID-19 tests are negative, then you can safely assume that your illness is due to one of the many less serious illnesses circulating in our community right now.     Conservative care is recommended with rest, drinking plenty of clear fluids, eating only when hungry, taking supportive medications for your symptoms and avoiding being around other people.  Please remain at home until you are fever free for 24 hours without the use of antifever medications such as Tylenol and ibuprofen.   Please read below to learn more about the medications, dosages and frequencies that I recommend to help alleviate your symptoms and to get you feeling better soon:   Advil, Motrin (ibuprofen): This is a good anti-inflammatory medication which addresses aches, pains and inflammation of the upper airways that causes sinus and nasal congestion as well as in the lower airways which makes your cough feel tight and sometimes burn.  I recommend that you take between 400 to 600 mg every 6-8 hours as needed.  Please do not take more than 2400 mg of ibuprofen in a 24-hour period and please do not take high doses of ibuprofen for more than 3 days in a row as this can lead to stomach ulcers.   Tylenol (acetaminophen): This is a good fever reducer.  If your body temperature rises above 101.5 as measured with a thermometer, it is recommended that you take 1,000 mg every 8 hours until your temperature falls below 101.5.  Please be careful when taking Tylenol (acetaminophen) along with other medications containing acetaminophen or Tylenol.  The maximum dose of Tylenol in a 24-hour period is  3000 mg, taking more than 3000 mg per day can damage your liver.         Atrovent (ipratropium): This is an excellent nasal decongestant spray that does not cause rebound congestion.  Please instill 2 sprays into each nare with each use, you may use this up to 3 times daily.  Once you find that you are forgetting to use the spray more often that you remember to use it, you will know that you no longer need it.  I have sent a prescription for this medication to your pharmacy because it cannot be purchased over-the-counter.   Promethazine DM: Promethazine is both a nasal decongestant that dries up mucous membranes and an antinausea medication.  Promethazine often makes most patients feel fairly sleepy.  "DM" is dextromethorphan, a single symptom reliever which is a cough suppressant found in many over-the-counter cough medications and combination cold preparations.  Please take 5 mL before bedtime to minimize your cough which will help you sleep better.  I have sent a prescription for this medication to your pharmacy because it cannot be purchased over-the-counter.   If symptoms have not meaningfully improved in the next 5 to 7 days, please return for repeat evaluation or follow-up with your regular provider.  If symptoms have worsened in the next 3 to 5 days, please return for repeat evaluation or follow-up with your regular provider.    Thank you for visiting urgent care today.  We  appreciate the opportunity to participate in your care.

## 2023-03-16 NOTE — ED Triage Notes (Signed)
Pt with c/o flu like symptoms for about 2 days. States she has tried tylenol cold and flu with no relief. Denies fever at home.

## 2023-03-16 NOTE — ED Provider Notes (Signed)
[facility]    CSN: [id number] Arrival date & time: 03/16/23  1602    HISTORY   Chief Complaint  Patient presents with   Flu like symptoms   HPI Janet Duran is a pleasant, 28 y.o. female who presents to urgent care today. Patient complains of nonproductive cough, body aches, headache, fatigue for the past 2 days.  Patient states has been taking Tylenol cold and flu without meaningful relief.  Patient states she is unaware of having fever.  Denies nausea, vomiting, diarrhea, sore throat, known sick contacts.  The history is provided by the patient.   Past Medical History:  Diagnosis Date   Anxiety    Depression    Ectopic beat    Ectopic heartbeat    Hypertension    Less than [redacted] weeks gestation of pregnancy 09/27/2021   Migraine    Multiple sclerosis (HCC)    Urinary urgency 12/26/2019   Patient Active Problem List   Diagnosis Date Noted   Hepatic steatosis 01/05/2023   Acne vulgaris 10/04/2022   Hypertriglyceridemia 04/05/2022   Hyperglycemia 04/05/2022   Benign hypertension 10/04/2021   Liver nodule 07/05/2021   CLL (chronic lymphocytic leukemia) (HCC) 12/30/2020   Elevated liver transaminase level 06/24/2020   High risk medication use 12/26/2019   Depression 09/03/2019   Class 1 obesity without serious comorbidity with body mass index (BMI) of 34.0 to 34.9 in adult 09/03/2019   B12 deficiency 05/27/2019   Multiple sclerosis (HCC) 05/05/2019   Vitamin D deficiency 05/05/2019   Migraine without aura and without status migrainosus, not intractable 03/04/2019   Ectopic heartbeat    Past Surgical History:  Procedure Laterality Date   NO PAST SURGERIES     OB History     Gravida  1   Para      Term      Preterm      AB  1   Living         SAB  1   IAB      Ectopic      Multiple      Live Births             Home Medications    Prior to Admission medications   Medication Sig Start Date End Date Taking? Authorizing  Provider  benzoyl peroxide-erythromycin (BENZAMYCIN) gel Apply topically 2 (two) times daily. Use once a day if causes excessive dryness 10/04/22   Nche, Bonna Gains, NP  escitalopram (LEXAPRO) 20 MG tablet Take 1 tablet (20 mg total) by mouth daily. 10/04/22   Nche, Bonna Gains, NP  natalizumab (TYSABRI) 300 MG/15ML injection Inject into the vein.    [provider]  norethindrone (MICRONOR) 0.35 MG tablet Take 1 tablet (0.35 mg total) by mouth daily. 10/04/22   Nche, Bonna Gains, NP  phentermine 30 MG capsule Take 1 capsule (30 mg total) by mouth every morning. 10/13/22   Sater, Pearletha Furl, MD  solifenacin (VESICARE) 10 MG tablet TAKE 1 TABLET BY MOUTH EVERY DAY 01/04/23   Sater, Pearletha Furl, MD  Vitamin D, Ergocalciferol, (DRISDOL) 1.25 MG (50000 UNIT) CAPS capsule Take 1 capsule (50,000 Units total) by mouth every 7 (seven) days. 04/05/22   Nche, Bonna Gains, NP    Family History Family History  Adopted: Yes   Social History Social History   Tobacco Use   Smoking status: Never   Smokeless tobacco: Never  Vaping Use   Vaping status: Never Used  Substance Use Topics  Alcohol use: Yes    Comment: rare   Drug use: Never   Allergies   Patient has no known allergies.  Review of Systems Review of Systems Pertinent findings revealed after performing a 14 point review of systems has been noted in the history of present illness.  Physical Exam Vital Signs BP (!) 145/79 (BP Location: Right Arm)   Pulse (!) 113   Temp 99.9 F (37.7 C) (Oral)   Resp 15   LMP 03/15/2023 (Exact Date)   SpO2 96%   No data found.  Physical Exam Vitals and nursing note reviewed.  Constitutional:      General: She is not in acute distress.    Appearance: Normal appearance. She is ill-appearing.  HENT:     Head: Normocephalic and atraumatic.     Salivary Glands: Right salivary gland is not diffusely enlarged or tender. Left salivary gland is not diffusely enlarged or tender.     Right  Ear: Tympanic membrane, ear canal and external ear normal. No drainage. No middle ear effusion. There is no impacted cerumen. Tympanic membrane is not erythematous or bulging.     Left Ear: Tympanic membrane, ear canal and external ear normal. No drainage.  No middle ear effusion. There is no impacted cerumen. Tympanic membrane is not erythematous or bulging.     Nose: Congestion and rhinorrhea present. No nasal deformity, septal deviation or mucosal edema. Rhinorrhea is clear.     Right Turbinates: Not enlarged, swollen or pale.     Left Turbinates: Not enlarged, swollen or pale.     Right Sinus: No maxillary sinus tenderness or frontal sinus tenderness.     Left Sinus: No maxillary sinus tenderness or frontal sinus tenderness.     Mouth/Throat:     Lips: Pink. No lesions.     Mouth: Mucous membranes are moist. No oral lesions.     Pharynx: Uvula midline. Pharyngeal swelling, posterior oropharyngeal erythema and uvula swelling present.     Tonsils: No tonsillar exudate. 0 on the right. 0 on the left.  Eyes:     General: Lids are normal.        Right eye: No discharge.        Left eye: No discharge.     Extraocular Movements: Extraocular movements intact.     Conjunctiva/sclera: Conjunctivae normal.     Right eye: Right conjunctiva is not injected.     Left eye: Left conjunctiva is not injected.  Neck:     Trachea: Trachea and phonation normal.  Cardiovascular:     Rate and Rhythm: Normal rate and regular rhythm.     Pulses: Normal pulses.     Heart sounds: Normal heart sounds. No murmur heard.    No friction rub. No gallop.  Pulmonary:     Effort: Pulmonary effort is normal. No accessory muscle usage, prolonged expiration or respiratory distress.     Breath sounds: Normal breath sounds. No stridor, decreased air movement or transmitted upper airway sounds. No decreased breath sounds, wheezing, rhonchi or rales.     Comments: Turbulent breath sounds throughout without wheeze, rale,  rhonchi. Chest:     Chest wall: No tenderness.  Abdominal:     General: Abdomen is flat. Bowel sounds are normal.     Palpations: Abdomen is soft.  Musculoskeletal:        General: Normal range of motion.     Cervical back: Normal range of motion and neck supple. Normal range of motion.  Lymphadenopathy:  Cervical: Cervical adenopathy present.     Right cervical: Superficial cervical adenopathy and posterior cervical adenopathy present.     Left cervical: Superficial cervical adenopathy and posterior cervical adenopathy present.  Skin:    General: Skin is warm and dry.     Findings: No erythema or rash.  Neurological:     General: No focal deficit present.     Mental Status: She is alert and oriented to person, place, and time.     Motor: Motor function is intact.     Coordination: Coordination is intact.     Gait: Gait is intact.     Deep Tendon Reflexes: Reflexes are normal and symmetric.  Psychiatric:        Attention and Perception: Attention and perception normal.        Mood and Affect: Mood and affect normal.        Speech: Speech normal.        Behavior: Behavior normal. Behavior is cooperative.        Thought Content: Thought content normal.     Visual Acuity Right Eye Distance:   Left Eye Distance:   Bilateral Distance:    Right Eye Near:   Left Eye Near:    Bilateral Near:     UC Couse / Diagnostics / Procedures:     Radiology No results found.  Procedures Procedures (including critical care time) EKG  Pending results:  Labs Reviewed  POC COVID19/FLU A&B COMBO   Abnormal Labs Reviewed - No abnormal labs to display  Medications Ordered in UC: Medications - No data to display  UC Diagnoses / Final Clinical Impressions(s)   I have reviewed the triage vital signs and the nursing notes.  Pertinent labs & imaging results that were available during my care of the patient were reviewed by me and considered in my medical decision making (see chart  for details).    Final diagnoses:  Influenza-like illness   Rapid flu test today is negative.  Rapid COVID-19 test today is negative.  Patient advised to repeat testing in 3 days if feeling no better.  Patient was likely suffering from influenza-like illness, possibly adenovirus or coronavirus.  Supportive medications discussed and prescribed.  Conservative care recommended.  Return precautions advised.  Please see discharge instructions below for details of plan of care as provided to patient. ED Prescriptions     Medication Sig Dispense Auth. Provider   promethazine-dextromethorphan (PROMETHAZINE-DM) 6.25-15 MG/5ML syrup Take 5 mLs by mouth 4 (four) times daily as needed for cough. 118 mL Theadora Rama Scales, PA-C   ipratropium (ATROVENT) 0.06 % nasal spray Place 2 sprays into both nostrils 3 (three) times daily. As needed for nasal congestion, runny nose 15 mL Theadora Rama Scales, PA-C      PDMP not reviewed this encounter.  Pending results:  Labs Reviewed  POC COVID19/FLU A&B COMBO      Discharge Instructions      Your rapid influenza antigen test today was negative.  No further influenza testing is indicated.     Your COVID-19 test is negative.  Please consider retesting in the next 2 to 3 days, particularly if you are not feeling any better.  You are welcome to return here to urgent care to have it done or you can take a home COVID-19 test.     If both your COVID-19 tests are negative, then you can safely assume that your illness is due to one of the many less serious illnesses circulating in  our community right now.     Conservative care is recommended with rest, drinking plenty of clear fluids, eating only when hungry, taking supportive medications for your symptoms and avoiding being around other people.  Please remain at home until you are fever free for 24 hours without the use of antifever medications such as Tylenol and ibuprofen.   Please read below to learn  more about the medications, dosages and frequencies that I recommend to help alleviate your symptoms and to get you feeling better soon:   Advil, Motrin (ibuprofen): This is a good anti-inflammatory medication which addresses aches, pains and inflammation of the upper airways that causes sinus and nasal congestion as well as in the lower airways which makes your cough feel tight and sometimes burn.  I recommend that you take between 400 to 600 mg every 6-8 hours as needed.  Please do not take more than 2400 mg of ibuprofen in a 24-hour period and please do not take high doses of ibuprofen for more than 3 days in a row as this can lead to stomach ulcers.   Tylenol (acetaminophen): This is a good fever reducer.  If your body temperature rises above 101.5 as measured with a thermometer, it is recommended that you take 1,000 mg every 8 hours until your temperature falls below 101.5.  Please be careful when taking Tylenol (acetaminophen) along with other medications containing acetaminophen or Tylenol.  The maximum dose of Tylenol in a 24-hour period is 3000 mg, taking more than 3000 mg per day can damage your liver.         Atrovent (ipratropium): This is an excellent nasal decongestant spray that does not cause rebound congestion.  Please instill 2 sprays into each nare with each use, you may use this up to 3 times daily.  Once you find that you are forgetting to use the spray more often that you remember to use it, you will know that you no longer need it.  I have sent a prescription for this medication to your pharmacy because it cannot be purchased over-the-counter.   Promethazine DM: Promethazine is both a nasal decongestant that dries up mucous membranes and an antinausea medication.  Promethazine often makes most patients feel fairly sleepy.  "DM" is dextromethorphan, a single symptom reliever which is a cough suppressant found in many over-the-counter cough medications and combination cold preparations.   Please take 5 mL before bedtime to minimize your cough which will help you sleep better.  I have sent a prescription for this medication to your pharmacy because it cannot be purchased over-the-counter.   If symptoms have not meaningfully improved in the next 5 to 7 days, please return for repeat evaluation or follow-up with your regular provider.  If symptoms have worsened in the next 3 to 5 days, please return for repeat evaluation or follow-up with your regular provider.    Thank you for visiting urgent care today.  We appreciate the opportunity to participate in your care.       Disposition Upon Discharge:  Condition: stable for discharge home  Patient presented with an acute illness with associated systemic symptoms and significant discomfort requiring urgent management. In my opinion, this is a condition that a prudent lay person (someone who possesses an average knowledge of health and medicine) may potentially expect to result in complications if not addressed urgently such as respiratory distress, impairment of bodily function or dysfunction of bodily organs.   Routine symptom specific, illness specific and/or disease  specific instructions were discussed with the patient and/or caregiver at length.   As such, the patient has been evaluated and assessed, work-up was performed and treatment was provided in alignment with urgent care protocols and evidence based medicine.  Patient/parent/caregiver has been advised that the patient may require follow up for further testing and treatment if the symptoms continue in spite of treatment, as clinically indicated and appropriate.  Patient/parent/caregiver has been advised to return to the [facility] or PCP if no better; to PCP or the Emergency Department if new signs and symptoms develop, or if the current signs or symptoms continue to change or worsen for further workup, evaluation and treatment as clinically indicated and appropriate  The patient  will follow up with their current PCP if and as advised. If the patient does not currently have a PCP we will assist them in obtaining one.   The patient may need specialty follow up if the symptoms continue, in spite of conservative treatment and management, for further workup, evaluation, consultation and treatment as clinically indicated and appropriate.  Patient/parent/caregiver verbalized understanding and agreement of plan as discussed.  All questions were addressed during visit.  Please see discharge instructions below for further details of plan.  This office note has been dictated using Teaching laboratory technician.  Unfortunately, this method of dictation can sometimes lead to typographical or grammatical errors.  I apologize for your inconvenience in advance if this occurs.  Please do not hesitate to reach out to me if clarification is needed.      Theadora Rama Scales, [place] 03/16/23 [phone]

## 2023-03-20 ENCOUNTER — Ambulatory Visit
Admission: RE | Admit: 2023-03-20 | Discharge: 2023-03-20 | Disposition: A | Discharge status: Home / Self Care | Payer: 59 | Source: Ambulatory Visit | Attending: Emergency Medicine | Admitting: Emergency Medicine

## 2023-03-20 VITALS — BP 127/83 | HR 86 | Temp 98.2°F | Resp 14

## 2023-03-20 DIAGNOSIS — J0140 Acute pansinusitis, unspecified: Secondary | ICD-10-CM

## 2023-03-20 DIAGNOSIS — H6993 Unspecified Eustachian tube disorder, bilateral: Secondary | ICD-10-CM | POA: Diagnosis not present

## 2023-03-20 MED ORDER — METHYLPREDNISOLONE 8 MG PO TABS: 32.0000 mg | ORAL_TABLET | Freq: Every day | ORAL | 0 refills | Status: AC

## 2023-03-20 MED ORDER — METHYLPREDNISOLONE 8 MG PO TABS: 8.0000 mg | ORAL_TABLET | Freq: Every day | ORAL | 0 refills | Status: DC

## 2023-03-20 MED ORDER — AMOXICILLIN-POT CLAVULANATE 875-125 MG PO TABS: 1.0000 | ORAL_TABLET | Freq: Two times a day (BID) | ORAL | 0 refills | Status: AC

## 2023-03-20 NOTE — Discharge Instructions (Addendum)
At this point in your illness, I believe it is likely you have a sinus infection that was initially viral and is starting to convert into a bacterial infection.  Bacteria is always present in our sinus cavities and inner ears and when you get a virus, this bacteria can complicate matters, keeping you from feeling better in a reasonable amount of time.  I recommend that we begin you on antibiotics and steroids to get you feeling better sooner.  Please read below to learn more about the medications, dosages and frequencies that I recommend to help alleviate your symptoms and to get you feeling better soon:   Augmentin (amoxicillin - clavulanic acid):  Please take one (1) dose twice daily for 7 days.  This antibiotic can cause upset stomach, this will resolve once antibiotics are complete.  You are welcome to take a probiotic, eat yogurt, take Imodium while taking this medication.  Please avoid other systemic medications such as Maalox, Pepto-Bismol or milk of magnesia as they can interfere with the body's ability to absorb the antibiotics.    Medrol (methylprednisolone): This is a steroid that will significantly calm your upper and lower airways.  Please take the daily 4 tablets daily with with food for the next 3 days.   Advil, Motrin (ibuprofen): This is a good anti-inflammatory medication which addresses aches, pains and inflammation of the upper airways that causes sinus and nasal congestion as well as in the lower airways which makes your cough feel tight and sometimes burn.  I recommend that you take between 400 to 600 mg every 6-8 hours as needed.  Please do not take more than 2400 mg of ibuprofen in a 24-hour period and please do not take high doses of ibuprofen for more than 3 days in a row as this can lead to stomach ulcers.   If symptoms have not meaningfully improved in the next 3 to 5 days, please return for repeat evaluation or follow-up with your regular provider.  If symptoms have worsened  in the next 3 to 5 days, please return for repeat evaluation or follow-up with your regular provider.    Thank you for visiting urgent care today.  We appreciate the opportunity to participate in your care.

## 2023-03-20 NOTE — ED Triage Notes (Signed)
Pt was seen last week for flu like symptoms, negative testing. States she is still feeling bad and temp last night was 100.3. taking OTC cold meds.

## 2023-03-20 NOTE — ED Provider Notes (Signed)
[facility]    CSN: [id number] Arrival date & time: 03/20/23  7829    HISTORY   Chief Complaint  Patient presents with   Influenza    Flu like symptoms. Negative test results last week. Running a fever - Entered by patient   HPI Janet Duran is a pleasant, 28 y.o. female who presents to urgent care today. Patient was seen at this location by me 5 days ago with influenza-like illness, tested negative for COVID-19 and influenza during that visit.  Patient had been sick for 2 days prior to being seen 5 days ago.  Patient states she has not had any improvement of her symptoms and is now having fever, Tmax 100.3, states she took Tylenol around 8 AM.  Patient states she is continue to take over-the-counter cold medications.  Now has popping in her ears, a "wicked" headache behind her eyes.  Patient denies dizziness, nausea, vomiting, diarrhea, cough, shortness of breath, loss of taste or smell, sore throat.  The history is provided by the patient.   Past Medical History:  Diagnosis Date   Anxiety    Depression    Ectopic beat    Ectopic heartbeat    Hypertension    Less than [redacted] weeks gestation of pregnancy 09/27/2021   Migraine    Multiple sclerosis (HCC)    Urinary urgency 12/26/2019   Patient Active Problem List   Diagnosis Date Noted   Hepatic steatosis 01/05/2023   Acne vulgaris 10/04/2022   Hypertriglyceridemia 04/05/2022   Hyperglycemia 04/05/2022   Benign hypertension 10/04/2021   Liver nodule 07/05/2021   CLL (chronic lymphocytic leukemia) (HCC) 12/30/2020   Elevated liver transaminase level 06/24/2020   High risk medication use 12/26/2019   Depression 09/03/2019   Class 1 obesity without serious comorbidity with body mass index (BMI) of 34.0 to 34.9 in adult 09/03/2019   B12 deficiency 05/27/2019   Multiple sclerosis (HCC) 05/05/2019   Vitamin D deficiency 05/05/2019   Migraine without aura and without status migrainosus, not intractable 03/04/2019    Ectopic heartbeat    Past Surgical History:  Procedure Laterality Date   NO PAST SURGERIES     OB History     Gravida  1   Para      Term      Preterm      AB  1   Living         SAB  1   IAB      Ectopic      Multiple      Live Births             Home Medications    Prior to Admission medications   Medication Sig Start Date End Date Taking? Authorizing Provider  amoxicillin-clavulanate (AUGMENTIN) 875-125 MG tablet Take 1 tablet by mouth 2 (two) times daily for 7 days. 03/20/23 03/27/23 Yes Theadora Rama Scales, PA-C  benzoyl peroxide-erythromycin [facility]) gel Apply topically 2 (two) times daily. Use once a day if causes excessive dryness 10/04/22   Nche, Bonna Gains, NP  escitalopram (LEXAPRO) 20 MG tablet Take 1 tablet (20 mg total) by mouth daily. 10/04/22   Nche, Bonna Gains, NP  ipratropium (ATROVENT) 0.06 % nasal spray Place 2 sprays into both nostrils 3 (three) times daily. As needed for nasal congestion, runny nose 03/16/23   Theadora Rama Scales, PA-C  methylPREDNISolone (MEDROL) 8 MG tablet Take 4 tablets (32 mg total) by mouth daily for 3 days. 03/20/23 03/23/23  Theadora Rama  Scales, PA-C  natalizumab (TYSABRI) 300 MG/15ML injection Inject into the vein.    [provider]  norethindrone (MICRONOR) 0.35 MG tablet Take 1 tablet (0.35 mg total) by mouth daily. 10/04/22   Nche, Bonna Gains, NP  phentermine 30 MG capsule Take 1 capsule (30 mg total) by mouth every morning. 10/13/22   Sater, Pearletha Furl, MD  promethazine-dextromethorphan (PROMETHAZINE-DM) 6.25-15 MG/5ML syrup Take 5 mLs by mouth 4 (four) times daily as needed for cough. 03/16/23   Theadora Rama Scales, PA-C  solifenacin (VESICARE) 10 MG tablet TAKE 1 TABLET BY MOUTH EVERY DAY 01/04/23   Sater, Pearletha Furl, MD  Vitamin D, Ergocalciferol, (DRISDOL) 1.25 MG (50000 UNIT) CAPS capsule Take 1 capsule (50,000 Units total) by mouth every 7 (seven) days. 04/05/22   Nche, Bonna Gains,  NP    Family History Family History  Adopted: Yes   Social History Social History   Tobacco Use   Smoking status: Never   Smokeless tobacco: Never  Vaping Use   Vaping status: Never Used  Substance Use Topics   Alcohol use: Yes    Comment: rare   Drug use: Never   Allergies   Patient has no known allergies.  Review of Systems Review of Systems Pertinent findings revealed after performing a 14 point review of systems has been noted in the history of present illness.  Physical Exam Vital Signs BP 127/83 (BP Location: Right Arm)   Pulse 86   Temp 98.2 F (36.8 C) (Oral)   Resp 14   LMP 03/15/2023 (Exact Date)   SpO2 98%   No data found.  Physical Exam Vitals and nursing note reviewed.  Constitutional:      General: She is awake. She is not in acute distress.    Appearance: Normal appearance. She is well-developed and well-groomed. She is ill-appearing.  HENT:     Head: Normocephalic and atraumatic.     Salivary Glands: Right salivary gland is diffusely enlarged. Right salivary gland is not tender. Left salivary gland is diffusely enlarged. Left salivary gland is not tender.     Right Ear: Hearing and external ear normal. No drainage. A middle ear effusion (Suppurative) is present. There is no impacted cerumen. Tympanic membrane is bulging. Tympanic membrane is not injected or erythematous.     Left Ear: Hearing and external ear normal. No drainage. A middle ear effusion (Suppurative) is present. There is no impacted cerumen. Tympanic membrane is not injected, erythematous or bulging.     Ears:     Comments: Bilateral EACs with erythema, right greater than left    Nose: Congestion and rhinorrhea present. No nasal deformity, septal deviation or mucosal edema. Rhinorrhea is clear.     Right Turbinates: Not enlarged, swollen or pale.     Left Turbinates: Not enlarged, swollen or pale.     Right Sinus: No maxillary sinus tenderness or frontal sinus tenderness.     Left  Sinus: No maxillary sinus tenderness or frontal sinus tenderness.     Mouth/Throat:     Lips: Pink. No lesions.     Mouth: Mucous membranes are moist. No oral lesions.     Pharynx: Uvula midline. Postnasal drip present. No pharyngeal swelling, oropharyngeal exudate, posterior oropharyngeal erythema or uvula swelling.     Tonsils: No tonsillar exudate. 0 on the right. 0 on the left.  Eyes:     General: Lids are normal.        Right eye: No discharge.  Left eye: No discharge.     Extraocular Movements: Extraocular movements intact.     Conjunctiva/sclera: Conjunctivae normal.     Right eye: Right conjunctiva is not injected.     Left eye: Left conjunctiva is not injected.  Neck:     Trachea: Trachea and phonation normal.  Cardiovascular:     Rate and Rhythm: Normal rate and regular rhythm.     Pulses: Normal pulses.     Heart sounds: Normal heart sounds. No murmur heard.    No friction rub. No gallop.  Pulmonary:     Effort: Pulmonary effort is normal. No tachypnea, bradypnea, accessory muscle usage, prolonged expiration or respiratory distress.     Breath sounds: Normal breath sounds. No stridor, decreased air movement or transmitted upper airway sounds. No decreased breath sounds, wheezing, rhonchi or rales.  Chest:     Chest wall: No tenderness.  Abdominal:     General: Abdomen is flat. Bowel sounds are normal.     Palpations: Abdomen is soft.  Musculoskeletal:        General: Normal range of motion.     Cervical back: Full passive range of motion without pain, normal range of motion and neck supple. Normal range of motion.  Lymphadenopathy:     Cervical: No cervical adenopathy.  Skin:    General: Skin is warm and dry.     Findings: No erythema or rash.  Neurological:     General: No focal deficit present.     Mental Status: She is alert and oriented to person, place, and time.     Motor: Motor function is intact.     Coordination: Coordination is intact.     Gait:  Gait is intact.     Deep Tendon Reflexes: Reflexes are normal and symmetric.  Psychiatric:        Attention and Perception: Attention and perception normal.        Mood and Affect: Mood and affect normal.        Speech: Speech normal.        Behavior: Behavior normal. Behavior is cooperative.        Thought Content: Thought content normal.     Visual Acuity Right Eye Distance:   Left Eye Distance:   Bilateral Distance:    Right Eye Near:   Left Eye Near:    Bilateral Near:     UC Couse / Diagnostics / Procedures:     Radiology No results found.  Procedures Procedures (including critical care time) EKG  Pending results:  Labs Reviewed - No data to display  Medications Ordered in UC: Medications - No data to display  UC Diagnoses / Final Clinical Impressions(s)   I have reviewed the triage vital signs and the nursing notes.  Pertinent labs & imaging results that were available during my care of the patient were reviewed by me and considered in my medical decision making (see chart for details).    Final diagnoses:  Acute pansinusitis, recurrence not specified  Acute dysfunction of both eustachian tubes   Middle ear effusion concerning for early sign of bacterial sinusitis and/or otitis media, particularly on the right.  Given duration of symptoms and new onset of fever, recommend antibiotics for empiric treatment of presumed bacterial superinfection and sinuses.  Patient also provided with Medrol to reduce inflammation, open eustachian tubes and promote drainage.  Continue Advil, promethazine DM and Atrovent nasal spray as prescribed at last visit.  Conservative care recommended.  Return precautions advised.  Please see discharge instructions below for details of plan of care as provided to patient. ED Prescriptions     Medication Sig Dispense Auth. Provider   amoxicillin-clavulanate (AUGMENTIN) 875-125 MG tablet Take 1 tablet by mouth 2 (two) times daily for 7 days.  14 tablet Theadora Rama Scales, PA-C   methylPREDNISolone (MEDROL) 8 MG tablet Take 4 tablets (32 mg total) by mouth daily for 3 days. 12 tablet Theadora Rama Scales, PA-C      PDMP not reviewed this encounter.  Pending results:  Labs Reviewed - No data to display    Discharge Instructions      At this point in your illness, I believe it is likely you have a sinus infection that was initially viral and is starting to convert into a bacterial infection.  Bacteria is always present in our sinus cavities and inner ears and when you get a virus, this bacteria can complicate matters, keeping you from feeling better in a reasonable amount of time.  I recommend that we begin you on antibiotics and steroids to get you feeling better sooner.  Please read below to learn more about the medications, dosages and frequencies that I recommend to help alleviate your symptoms and to get you feeling better soon:   Augmentin (amoxicillin - clavulanic acid):  Please take one (1) dose twice daily for 7 days.  This antibiotic can cause upset stomach, this will resolve once antibiotics are complete.  You are welcome to take a probiotic, eat yogurt, take Imodium while taking this medication.  Please avoid other systemic medications such as Maalox, Pepto-Bismol or milk of magnesia as they can interfere with the body's ability to absorb the antibiotics.    Medrol (methylprednisolone): This is a steroid that will significantly calm your upper and lower airways.  Please take the daily 4 tablets daily with with food for the next 3 days.   Advil, Motrin (ibuprofen): This is a good anti-inflammatory medication which addresses aches, pains and inflammation of the upper airways that causes sinus and nasal congestion as well as in the lower airways which makes your cough feel tight and sometimes burn.  I recommend that you take between 400 to 600 mg every 6-8 hours as needed.  Please do not take more than 2400 mg of  ibuprofen in a 24-hour period and please do not take high doses of ibuprofen for more than 3 days in a row as this can lead to stomach ulcers.   If symptoms have not meaningfully improved in the next 3 to 5 days, please return for repeat evaluation or follow-up with your regular provider.  If symptoms have worsened in the next 3 to 5 days, please return for repeat evaluation or follow-up with your regular provider.    Thank you for visiting urgent care today.  We appreciate the opportunity to participate in your care.     Disposition Upon Discharge:  Condition: stable for discharge home  Patient presented with an acute illness with associated systemic symptoms and significant discomfort requiring urgent management. In my opinion, this is a condition that a prudent lay person (someone who possesses an average knowledge of health and medicine) may potentially expect to result in complications if not addressed urgently such as respiratory distress, impairment of bodily function or dysfunction of bodily organs.   Routine symptom specific, illness specific and/or disease specific instructions were discussed with the patient and/or caregiver at length.   As such, the patient has been evaluated and assessed, work-up  was performed and treatment was provided in alignment with urgent care protocols and evidence based medicine.  Patient/parent/caregiver has been advised that the patient may require follow up for further testing and treatment if the symptoms continue in spite of treatment, as clinically indicated and appropriate.  Patient/parent/caregiver has been advised to return to the [facility] or PCP if no better; to PCP or the Emergency Department if new signs and symptoms develop, or if the current signs or symptoms continue to change or worsen for further workup, evaluation and treatment as clinically indicated and appropriate  The patient will follow up with their current PCP if and as advised. If the  patient does not currently have a PCP we will assist them in obtaining one.   The patient may need specialty follow up if the symptoms continue, in spite of conservative treatment and management, for further workup, evaluation, consultation and treatment as clinically indicated and appropriate.  Patient/parent/caregiver verbalized understanding and agreement of plan as discussed.  All questions were addressed during visit.  Please see discharge instructions below for further details of plan.  This office note has been dictated using Teaching laboratory technician.  Unfortunately, this method of dictation can sometimes lead to typographical or grammatical errors.  I apologize for your inconvenience in advance if this occurs.  Please do not hesitate to reach out to me if clarification is needed.      Theadora Rama Scales, PA-C 03/20/23 1004

## 2023-03-28 ENCOUNTER — Encounter: Payer: Self-pay | Admitting: Neurology

## 2023-04-02 ENCOUNTER — Other Ambulatory Visit: Payer: Self-pay | Admitting: Nurse Practitioner

## 2023-04-02 DIAGNOSIS — E538 Deficiency of other specified B group vitamins: Secondary | ICD-10-CM

## 2023-04-03 ENCOUNTER — Telehealth: Payer: Self-pay | Admitting: Nurse Practitioner

## 2023-04-03 ENCOUNTER — Ambulatory Visit: Payer: 59 | Admitting: Nurse Practitioner

## 2023-04-03 NOTE — Telephone Encounter (Signed)
 04/03/2023 1st no show, letter sent via mail

## 2023-05-01 NOTE — Progress Notes (Unsigned)
 No chief complaint on file.    HISTORY OF PRESENT ILLNESS:  05/01/23 ALL:  Janet Duran is a 28 y.o. female here today for follow up for RRMS. She continues Tysabri infusions every 6 weeks (tolerability). Labs have been normal with exception of mildly elevated liver enzymes. Hepatitis labs negative. JCV 0.11, negative 01/2023. Last MRI brain and cervical spine 06/2021 were stable.   She denies exacerbating symptoms. She does have more generalized neck and lumbar back pain. She describes tightness and dull achy pain. Keeps her up at night. She doesn't feel like she sleeps very well. She denies changes in gait. No difficulty walking and does not use assistive device. She has taken Tylenol and Ibuprofen with minimal relief.   She stays tired. She feels depression is fairly stable. She has tried taking antidepressants in the past that were not helpful. She is trying to increase exercise.   Detrol LA helps with urinary frequency.   She continues vitamin D supplements, 50,000iu daily.    HISTORY (copied from Dr Bonnita Hollow previous note)  Janet Duran is a 28 y.o. woman with relapsing remitting multiple sclerosis.   Update 10/13/2022: She is on Tysabri and tolerating well..   No exacerbations.  No new neurologic symptoms.   JCV Ab was negative in June 2024.   She has had elevated lymphocyte and saw hematology.  Flow cytometry was normal and this is likely due to the Tysabri therapy..     Currently, she reports her gait is doing fairly well with just mildly reduced balance.  She sometimes uses the rail on the stairs but often will use it anyhow for safety.  She keeps up with others on long walks. She has had some intermittent foot tingling.  No painful dysesthesias.  She denies weakness in her arms or legs but has some spasticity that has been sable since early in the disease course.       She notes urinary urgency improved with Detrol LA.  Co-pay is high.     When hot or tired  some of the symptoms are worse.     Vision is doing well   She has fatigue, physical > cognitive     She sleeps well most nights..    She notes depression and anxiety, a little better this year.   She is on Lexapro    She notes mild brain fog and has had trouble coming up with the right word at times.    Phentermine helps fatigue a bit and she lost 10 pounds.    She works at [employer].      She is taking vitamin D supplements 50000 weekly.    She no longer takes B12 - levels were good earlier this yea   She has some migraine headache.   These have not bothered her too much.  She occasionally takes rizatriptan with benefit most of the time.  Frequency is lower since BP better controlled.   In the past.  LFTs were elevated in 2022 but fine now   Hepatitis labs were fine.   She had an U/S and MRI and no pathology.  Her lymphocytes counts have been running high - likely due to natalizumab.     Weight is stable.        MS history: She was diagnosed with MS in March 2021 after presenting with fuzzy vision in both eyes and numbness in her right hand.   She had a migraine at the time.  Symptoms improved after 15 minutes.   She saw her PCP for suspected complicated migraine and was referred to Dr. Malvin Johns.   She had a brain MRI which was consistent with MS.   She also had a cervical spine MRI.   To confirm the diagnosis, she underwent an LP.  It showed oligoclonal bands confirming the diagnosis of MS.    She was started on Tysabri.   She is on Tysabri 300 mg every 6 weeks and tolerates it well.    She has not had not had any exacerbations.   Her next infusion is scheduled for 01/24/2020.   Her last JCV antibody was 0.10 (negative)  08/07/2019.   IMAGING MRI of the cervical spine 07/20/2021 shows a T2 hyperintense focus to the left adjacent to C6.  This focus was acute on the 2021 MRI.  Another focus that had been mentioned at C4-C5 centrally is not seen on the current MRI.Marland Kitchen   Minimal degenerative  changes at C4-C5.     MRI of the brain 08/02/2021 showed multiple T2/FLAIR hyperintense foci in the periventricular, juxtacortical and deep white matter of the cerebral hemispheres. Other foci are noted in the left lateral thalamus/internal capsule and in the right cerebellar hemisphere. Compared to the study dated 01/07/2020, there are no new lesions.    MRI of the brain 04/27/2019 showed multiple T2/FLAIR hyperintense foci in the hemispheres, many in the periventricular white matter radially oriented to the ventricles.  The foci was also present in the lateral left thalamus, right pons and right cerebellum.  None of the foci enhanced.   MRI of the cervical spine showed abnormal T2 hyperintense foci to the left adjacent to C6 and centrally adjacent to C4-C5.  The C6 focus enhanced after contrast.   MRI 01/07/2020 showed T2/FLAIR hyperintense foci in the hemispheres, left thalamus and right cerebellar hemisphere in a pattern and configuration consistent with chronic demyelinating plaque associated with multiple sclerosis.  None of the foci appear to be acute.  They do not enhance.    There is a normal enhancement pattern.   LABS Lab work from 05/02/2019 was reviewed.  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   Family History: She is adopted.      REVIEW OF SYSTEMS: Out of a complete 14 system review of symptoms, the patient complains only of the following symptoms, fatigue, depression, back and neck pain.and all other reviewed systems are negative.   ALLERGIES: No Known Allergies   HOME MEDICATIONS: Outpatient Medications Prior to Visit  Medication Sig Dispense  Refill   benzoyl peroxide-erythromycin (BENZAMYCIN) gel Apply topically 2 (two) times daily. Use once a day if causes excessive dryness 46.6 g 1   escitalopram (LEXAPRO) 20 MG tablet Take 1 tablet (20 mg total) by mouth daily. 90 tablet 3   ipratropium (ATROVENT) 0.06 % nasal spray Place 2 sprays into both nostrils 3 (three) times daily. As needed for nasal congestion, runny nose 15 mL 1   natalizumab (TYSABRI) 300 MG/15ML injection Inject into the vein.     norethindrone (MICRONOR) 0.35 MG tablet Take 1 tablet (0.35 mg total) by mouth daily. 72 tablet 3   phentermine 30 MG capsule Take 1 capsule (30 mg total)  by mouth every morning. 30 capsule 5   promethazine-dextromethorphan (PROMETHAZINE-DM) 6.25-15 MG/5ML syrup Take 5 mLs by mouth 4 (four) times daily as needed for cough. 118 mL 0   solifenacin (VESICARE) 10 MG tablet TAKE 1 TABLET BY MOUTH EVERY DAY 30 tablet 4   Vitamin D, Ergocalciferol, (DRISDOL) 1.25 MG (50000 UNIT) CAPS capsule Take 1 capsule (50,000 Units total) by mouth every 7 (seven) days. 12 capsule 0   No facility-administered medications prior to visit.     PAST MEDICAL HISTORY: Past Medical History:  Diagnosis Date   Anxiety    Depression    Ectopic beat    Ectopic heartbeat    Hypertension    Less than [redacted] weeks gestation of pregnancy 09/27/2021   Migraine    Multiple sclerosis (HCC)    Urinary urgency 12/26/2019     PAST SURGICAL HISTORY: Past Surgical History:  Procedure Laterality Date   NO PAST SURGERIES       FAMILY HISTORY: Family History  Adopted: Yes     SOCIAL HISTORY: Social History   Socioeconomic History   Marital status: Single    Spouse name: Not on file   Number of children: 0   Years of education: Bachelors    Highest education level: Bachelor's degree (e.g., BA, AB, BS)  Occupational History   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   Tobacco Use   Smoking status: Never   Smokeless tobacco: Never  Vaping Use   Vaping status: Never  Used  Substance and Sexual Activity   Alcohol use: Yes    Comment: rare   Drug use: Never   Sexual activity: Yes    Birth control/protection: Pill  Other Topics Concern   Not on file  Social History Narrative   Right handed    Lives with boyfriend   Caffeine use: Soda daily ( 1 per day)   Social Drivers of Corporate investment banker Strain: Low Risk  (12/28/2022)   Overall Financial Resource Strain (CARDIA)    Difficulty of Paying Living Expenses: Not very hard  Food Insecurity: No Food Insecurity (12/28/2022)   Hunger Vital Sign    Worried About Running Out of Food in the Last Year: Never true    Ran Out of Food in the Last Year: Never true  Transportation Needs: No Transportation Needs (12/28/2022)   PRAPARE - Administrator, Civil Service (Medical): No    Lack of Transportation (Non-Medical): No  Physical Activity: Insufficiently Active (12/28/2022)   Exercise Vital Sign    Days of Exercise per Week: 2 days    Minutes of Exercise per Session: 30 min  Stress: Stress Concern Present (12/28/2022)   [employer] of Occupational Health - Occupational Stress Questionnaire    Feeling of Stress : Very much  Social Connections: Socially Isolated (12/28/2022)   Social Connection and Isolation Panel [NHANES]    Frequency of Communication with Friends and Family: Three times a week    Frequency of Social Gatherings with Friends and Family: Once a week    Attends Religious Services: Never    Database administrator or Organizations: No    Attends Engineer, structural: Not on file    Marital Status: Never married  Intimate Partner Violence: Not At Risk (03/25/2022)   Humiliation, Afraid, Rape, and Kick questionnaire    Fear of Current or Ex-Partner: No    Emotionally Abused: No    Physically Abused: No    Sexually Abused: No  PHYSICAL EXAM  There were no vitals filed for this visit.  There is no height or weight on file to calculate BMI.  Generalized:  Well developed, in no acute distress  Cardiology: normal rate and rhythm, no murmur auscultated  Respiratory: clear to auscultation bilaterally    Neurological examination  Mentation: Alert oriented to time, place, history taking. Follows all commands speech and language fluent Cranial nerve II-XII: Pupils were equal round reactive to light. Extraocular movements were full, visual field were full on confrontational test. Facial sensation and strength were normal. Head turning and shoulder shrug  were normal and symmetric. Motor: The motor testing reveals 5 over 5 strength of all 4 extremities. Good symmetric motor tone is noted throughout.  Sensory: Sensory testing is intact to soft touch on all 4 extremities. No evidence of extinction is noted.  Coordination: Cerebellar testing reveals good finger-nose-finger and heel-to-shin bilaterally.  Gait and station: Gait is normal.  Reflexes: Deep tendon reflexes are symmetric and normal bilaterally.    DIAGNOSTIC DATA (LABS, IMAGING, TESTING) - I reviewed patient records, labs, notes, testing and imaging myself where available.  Lab Results  Component Value Date   WBC 12.0 (H) 02/20/2023   HGB 12.9 02/20/2023   HCT 38.3 02/20/2023   MCV 88 02/20/2023   PLT 413 02/20/2023      Component Value Date/Time   NA 141 10/05/2022 0943   NA 138 06/10/2020 1210   K 3.9 10/05/2022 0943   CL 104 10/05/2022 0943   CO2 29 10/05/2022 0943   GLUCOSE 119 (H) 10/05/2022 0943   BUN 12 10/05/2022 0943   BUN 8 06/10/2020 1210   CREATININE 0.83 10/05/2022 0943   CALCIUM 9.6 10/05/2022 0943   PROT 7.3 10/05/2022 0943   PROT 7.5 12/23/2020 1327   ALBUMIN 5.0 10/05/2022 0943   ALBUMIN 5.1 (H) 12/23/2020 1327   AST 14 (L) 10/05/2022 0943   ALT 22 10/05/2022 0943   ALKPHOS 45 10/05/2022 0943   BILITOT 0.6 10/05/2022 0943   GFRNONAA >60 10/05/2022 0943   GFRAA 147 02/01/2019 1543   Lab Results  Component Value Date   CHOL 220 (H) 01/05/2023   HDL  44.30 01/05/2023   LDLCALC 159 (H) 01/05/2023   LDLDIRECT 125.0 04/05/2022   TRIG 85.0 01/05/2023   CHOLHDL 5 01/05/2023   Lab Results  Component Value Date   HGBA1C 5.5 01/05/2023   Lab Results  Component Value Date   VITAMINB12 1,042 (H) 10/04/2022   Lab Results  Component Value Date   TSH 1.20 07/05/2021        No data to display               No data to display           ASSESSMENT AND PLAN  28 y.o. year old female  has a past medical history of Anxiety, Depression, Ectopic beat, Ectopic heartbeat, Hypertension, Less than [redacted] weeks gestation of pregnancy (09/27/2021), Migraine, Multiple sclerosis (HCC), and Urinary urgency (12/26/2019). here with    No diagnosis found.  Janet Duran is doing well, today. No new or exacerbating symptoms. She will continue Tysabri infusions every 6 weeks. I will update labs. MRI stable 12/2019. She will continue detrol LA. I will try her on meloxicam 7.5mg  daily and baclofen 10mg  up to three times daily as needed for neck and back pain. She will continue vitamin D as prescribed. Healthy lifestyle habits encouraged. She will consider psychology if depression worsens. She will follow up in  6 months with Dr Epimenio Foot.    No orders of the defined types were placed in this encounter.     No orders of the defined types were placed in this encounter.      Shawnie Dapper, MSN, FNP-C 05/01/2023, 10:19 AM  [facility] [address] [place], [place] [address] [phone]

## 2023-05-02 ENCOUNTER — Ambulatory Visit: Payer: 59 | Admitting: Family Medicine

## 2023-05-02 ENCOUNTER — Encounter: Payer: Self-pay | Admitting: Family Medicine

## 2023-05-02 VITALS — BP 132/88 | HR 103 | Ht 63.0 in | Wt 174.0 lb

## 2023-05-02 DIAGNOSIS — R5383 Other fatigue: Secondary | ICD-10-CM | POA: Diagnosis not present

## 2023-05-02 DIAGNOSIS — G43109 Migraine with aura, not intractable, without status migrainosus: Secondary | ICD-10-CM | POA: Diagnosis not present

## 2023-05-02 DIAGNOSIS — G35 Multiple sclerosis: Secondary | ICD-10-CM | POA: Diagnosis not present

## 2023-05-02 DIAGNOSIS — M542 Cervicalgia: Secondary | ICD-10-CM

## 2023-05-02 DIAGNOSIS — Z79899 Other long term (current) drug therapy: Secondary | ICD-10-CM

## 2023-05-02 DIAGNOSIS — G8929 Other chronic pain: Secondary | ICD-10-CM

## 2023-05-02 DIAGNOSIS — M545 Low back pain, unspecified: Secondary | ICD-10-CM

## 2023-05-02 DIAGNOSIS — E559 Vitamin D deficiency, unspecified: Secondary | ICD-10-CM

## 2023-05-02 MED ORDER — SOLIFENACIN SUCCINATE 10 MG PO TABS: ORAL_TABLET | ORAL | 4 refills | Status: DC

## 2023-05-02 NOTE — Patient Instructions (Addendum)
 Below is our plan:  We will continue current treatment plan. We will refer you to PT for the back pain. I have ordered MRI of brain and cervical spine for monitoring and for dizziness.   Please make sure you are staying well hydrated. I recommend 50-60 ounces daily. Well balanced diet and regular exercise encouraged. Consistent sleep schedule with 6-8 hours recommended.   Please continue follow up with care team as directed.   Follow up with Dr Epimenio Foot in 6 months   You may receive a survey regarding today's visit. I encourage you to leave honest feed back as I do use this information to improve patient care. Thank you for seeing me today!

## 2023-05-11 ENCOUNTER — Ambulatory Visit: Attending: Family Medicine | Admitting: Physical Therapy

## 2023-05-15 ENCOUNTER — Other Ambulatory Visit: Payer: Self-pay | Admitting: *Deleted

## 2023-05-15 ENCOUNTER — Telehealth: Payer: Self-pay | Admitting: *Deleted

## 2023-05-15 ENCOUNTER — Other Ambulatory Visit: Payer: Self-pay

## 2023-05-15 DIAGNOSIS — G35 Multiple sclerosis: Secondary | ICD-10-CM

## 2023-05-15 DIAGNOSIS — Z79899 Other long term (current) drug therapy: Secondary | ICD-10-CM

## 2023-05-15 NOTE — Telephone Encounter (Signed)
 Placed JCV lab in [facility] lock box for routine lab pick up. Results pending.

## 2023-05-16 ENCOUNTER — Other Ambulatory Visit: Payer: Self-pay | Admitting: Neurology

## 2023-05-16 ENCOUNTER — Ambulatory Visit

## 2023-05-16 ENCOUNTER — Encounter: Payer: Self-pay | Admitting: Neurology

## 2023-05-16 DIAGNOSIS — G35 Multiple sclerosis: Secondary | ICD-10-CM

## 2023-05-16 LAB — CBC WITH DIFFERENTIAL/PLATELET
Basophils Absolute: 0.1 10*3/uL (ref 0.0–0.2)
Basos: 1 %
EOS (ABSOLUTE): 0.5 10*3/uL — ABNORMAL HIGH (ref 0.0–0.4)
Eos: 2 %
Hematocrit: 40.9 % (ref 34.0–46.6)
Hemoglobin: 14.3 g/dL (ref 11.1–15.9)
Immature Grans (Abs): 0 10*3/uL (ref 0.0–0.1)
Immature Granulocytes: 0 %
Lymphocytes Absolute: 7.2 10*3/uL — ABNORMAL HIGH (ref 0.7–3.1)
Lymphs: 35 %
MCH: 30.2 pg (ref 26.6–33.0)
MCHC: 35 g/dL (ref 31.5–35.7)
MCV: 87 fL (ref 79–97)
Monocytes Absolute: 1.2 10*3/uL — ABNORMAL HIGH (ref 0.1–0.9)
Monocytes: 6 %
Neutrophils Absolute: 11.5 10*3/uL — ABNORMAL HIGH (ref 1.4–7.0)
Neutrophils: 56 %
Platelets: 374 10*3/uL (ref 150–450)
RBC: 4.73 x10E6/uL (ref 3.77–5.28)
RDW: 13.1 % (ref 11.7–15.4)
WBC: 20.6 10*3/uL (ref 3.4–10.8)

## 2023-05-16 MED ORDER — AZITHROMYCIN 250 MG PO TABS: ORAL_TABLET | ORAL | 0 refills | Status: DC

## 2023-05-16 MED ORDER — GADOBENATE DIMEGLUMINE 529 MG/ML IV SOLN
15.0000 mL | Freq: Once | INTRAVENOUS | Status: AC | PRN
  Administered 2023-05-16: 15 mL via INTRAVENOUS

## 2023-05-17 ENCOUNTER — Other Ambulatory Visit: Payer: Self-pay | Admitting: Neurology

## 2023-05-17 ENCOUNTER — Encounter: Payer: Self-pay | Admitting: Family Medicine

## 2023-05-17 NOTE — Telephone Encounter (Signed)
 Last seen on 05/02/23 Follow up scheduled on 11/30/23 Last filled on 03/13/23 #30 tablets  Rx pending to be signed

## 2023-05-25 ENCOUNTER — Ambulatory Visit: Attending: Family Medicine | Admitting: Physical Therapy

## 2023-05-25 ENCOUNTER — Encounter: Payer: Self-pay | Admitting: Physical Therapy

## 2023-05-25 ENCOUNTER — Other Ambulatory Visit: Payer: Self-pay

## 2023-05-25 VITALS — BP 129/88 | HR 92

## 2023-05-25 DIAGNOSIS — M546 Pain in thoracic spine: Secondary | ICD-10-CM | POA: Diagnosis present

## 2023-05-25 DIAGNOSIS — M62830 Muscle spasm of back: Secondary | ICD-10-CM | POA: Diagnosis present

## 2023-05-25 DIAGNOSIS — R252 Cramp and spasm: Secondary | ICD-10-CM | POA: Diagnosis present

## 2023-05-25 DIAGNOSIS — M545 Low back pain, unspecified: Secondary | ICD-10-CM | POA: Insufficient documentation

## 2023-05-25 DIAGNOSIS — R202 Paresthesia of skin: Secondary | ICD-10-CM | POA: Diagnosis present

## 2023-05-25 DIAGNOSIS — G8929 Other chronic pain: Secondary | ICD-10-CM | POA: Diagnosis not present

## 2023-05-25 DIAGNOSIS — M5459 Other low back pain: Secondary | ICD-10-CM

## 2023-05-25 NOTE — Therapy (Unsigned)
 OUTPATIENT PHYSICAL THERAPY THORACOLUMBAR EVALUATION   Patient Name: Janet Duran MRN: [id number] DOB:10/26/1995, 28 y.o., female Today's Date: 05/26/2023  END OF SESSION:  PT End of Session - 05/25/23 1541     Visit Number 1    Number of Visits 5   4 + eval   Date for PT Re-Evaluation 07/14/23   pushed out due to pt scheduling limitations   Authorization Type [employer]    PT Start Time 1535    PT Stop Time 1615    PT Time Calculation (min) 40 min    Behavior During Therapy [facility] for tasks assessed/performed             Past Medical History:  Diagnosis Date   Anxiety    Depression    Ectopic beat    Ectopic heartbeat    Hypertension    Less than [redacted] weeks gestation of pregnancy 09/27/2021   Migraine    Multiple sclerosis (HCC)    Urinary urgency 12/26/2019   Past Surgical History:  Procedure Laterality Date   NO PAST SURGERIES     Patient Active Problem List   Diagnosis Date Noted   Hepatic steatosis 01/05/2023   Acne vulgaris 10/04/2022   Hypertriglyceridemia 04/05/2022   Hyperglycemia 04/05/2022   Benign hypertension 10/04/2021   Liver nodule 07/05/2021   CLL (chronic lymphocytic leukemia) (HCC) 12/30/2020   Elevated liver transaminase level 06/24/2020   High risk medication use 12/26/2019   Depression 09/03/2019   Class 1 obesity without serious comorbidity with body mass index (BMI) of 34.0 to 34.9 in adult 09/03/2019   B12 deficiency 05/27/2019   Multiple sclerosis (HCC) 05/05/2019   Vitamin D deficiency 05/05/2019   Migraine without aura and without status migrainosus, not intractable 03/04/2019   Ectopic heartbeat     PCP: Alfredia Ferguson, PA-C (first appt with this provider scheduled week of April 7)  REFERRING PROVIDER: Shawnie Dapper, NP  REFERRING DIAG: M54.50,G89.29 (ICD-10-CM) - Chronic bilateral low back pain without sciatica  Rationale for Evaluation and Treatment: Rehabilitation  THERAPY DIAG:  Other low back pain  Cramp  and spasm  Pain in thoracic spine  Paresthesia of skin  Muscle spasm of back  ONSET DATE: 15 years  SUBJECTIVE:                                                                                                                                                                                           SUBJECTIVE STATEMENT: Pt reports her entire spine, mostly centrally, has bothered her since she was 28 years old.  She states it has been hard to tell with her other  issues if they are her MS presentation or related to the back pain or both.  She states the back sometimes spasms as well as the legs, mostly in the calves.    She reports random intermittent dizziness that she attributes to her MS, when questioned about this being bothersome she states the back is her primary focus as it is most bothersome.  PERTINENT HISTORY:  RRMS diagnosed in 2021?, depression, CLL, HTN, migraines, dizziness, elevated liver enzymes  PAIN:  Are you having pain? Yes: NPRS scale: 5 (current), 9 (worst), 1-2 (best) Pain location: entire central spine Pain description: tight, throbbing Aggravating factors: not really Relieving factors: nothing  PRECAUTIONS: None  RED FLAGS: None   WEIGHT BEARING RESTRICTIONS: No  FALLS:  Has patient fallen in last 6 months? No  LIVING ENVIRONMENT: Lives with: lives alone Lives in: House/apartment Stairs: No Has following equipment at home: None  OCCUPATION: Child psychotherapist  PLOF: Independent  PATIENT GOALS: "I'm open minded"  NEXT MD VISIT: 05/29/2023 Alfredia Ferguson, PA-C (Fam Med)  OBJECTIVE:  Note: Objective measures were completed at Evaluation unless otherwise noted.  DIAGNOSTIC FINDINGS:  Brain MRI 05/16/2023 IMPRESSION: This MRI of the brain with and without contrast shows the following: Scattered T2/FLAIR hyperintense foci of the cerebral hemispheres and a focus in the left thalamus and probably in the right cerebellar hemisphere.  These are consistent  with chronic demyelinating plaque associated with multiple sclerosis.  None of the foci enhance after contrast.  Compared to the MRI from 07/20/2021, there were no new lesions. Chronic pansinusitis as detailed above No acute findings. Normal enhancement pattern.  Cervical MRI 05/16/2023 IMPRESSION: This MRI of the cervical spine with and without contrast shows the following: There is a T2 hyperintense focus within the spinal cord toward the left adjacent to C6, unchanged in appearance compared to the MRI from 2023.  It is consistent with a chronic demyelinating plaque associated with multiple sclerosis.  It does not enhance another plaque is also noted in the right cerebellar hemisphere.  No new lesions are noted. Negligible disc degenerative changes C4-C5 does not lead to spinal stenosis or nerve root compression. Normal enhancement pattern.  PATIENT SURVEYS:  Modified Oswestry 14/50 = 28% (low perceived disability)   COGNITION: Overall cognitive status: Within functional limits for tasks assessed     SENSATION: Light touch: WFL; pt states she has paresthesias randomly in varying areas of lower limbs  POSTURE: forward head  PALPATION: No abnormal paraspinal tenderness or tightness noted, but pt reports tenderness at cervicothoracic junction, mid-lumber spinous processes and lumbosacral junction  LUMBAR ROM:   AROM eval  Flexion WFL  Extension "; lumbar achiness  Right lateral flexion "; lumbar achiness  Left lateral flexion "; lumbar achiness  Right rotation WFL  Left rotation WFL   (Blank rows = not tested)  LOWER EXTREMITY ROM/MMT:     Active  Right eval Left eval  Hip flexion 4/5 4/5  Hip extension    Hip abduction 4+/5 4+/5  Hip adduction    Hip internal rotation    Hip external rotation    Knee flexion 4/5 4/5  Knee extension 5/5 5/5  Ankle dorsiflexion 5/5 5/5  Ankle plantarflexion 5/5 5/5  Ankle inversion    Ankle eversion     (Blank rows = not  tested)  LUMBAR SPECIAL TESTS:  Slump test: + left only, FABER test: Negative, and Trendelenburg sign: Negative  FUNCTIONAL TESTS:  5 times sit to stand: 10.87 sec no UE support  MCTSIB: Condition 1: Avg of 3 trials: 30 sec, Condition 2: Avg of 3 trials: 30 sec, Condition 3: Avg of 3 trials: 30 sec, Condition 4: Avg of 3 trials: 30; mild sway sec, and Total Score: 120/120  GAIT: Distance walked: Various clinic distances Assistive device utilized: None Level of assistance: Complete Independence Comments: No notable impairments/abnormalities.  TREATMENT DATE: 05/25/2023 - eval only.                                                                                                                              PATIENT EDUCATION:  Education details: PT POC, assessments used, and goals to be set. Person educated: Patient Education method: Explanation Education comprehension: verbalized understanding  HOME EXERCISE PROGRAM: To be established.  ASSESSMENT:  CLINICAL IMPRESSION: Patient is a 28 y.o. female who was seen today for physical therapy evaluation and treatment for chronic centralized spine pain.  Pt has a significant PMH of RRMS diagnosed in 2021?, depression, CLL, HTN, migraines, dizziness, and elevated liver enzymes.  Identified impairments include spine pain, tenderness at cervicothoracic and lumbosacral junctions, mild forward head posture, achiness w/ lumbar AROM, and proximal LE weakness.  She has some mild left sciatic nerve symptoms with left Slump testing.  Evaluation via the following assessment tools: 5xSTS and mCTSIB did not indicate fall risk.  Her gait pattern is WNL.  She would benefit from skilled PT to address impairments as noted and progress towards long term goals.  OBJECTIVE IMPAIRMENTS: decreased strength, dizziness, increased muscle spasms, impaired sensation, improper body mechanics, postural dysfunction, and pain.   ACTIVITY LIMITATIONS: lifting, squatting, and  locomotion level  PARTICIPATION LIMITATIONS: community activity  PERSONAL FACTORS: Fitness, Past/current experiences, Time since onset of injury/illness/exacerbation, and 1-2 comorbidities: MS, migraines  are also affecting patient's functional outcome.   REHAB POTENTIAL: Good  CLINICAL DECISION MAKING: Evolving/moderate complexity  EVALUATION COMPLEXITY: Moderate   GOALS: Goals reviewed with patient? Yes  SHORT TERM GOALS = LONG TERM GOALS: Target date: 07/07/2023 (pt scheduled 2 weeks out from eval)  Pt will be independent and compliant with strengthening and stretching HEP in order to maintain functional progress and improve mobility. Baseline:  To be established. Goal status: INITIAL  2.  Patient will be compliant to formal walking program >/= 3 days per week to improve aerobic tolerance and ambulatory mechanics. Baseline: To be established. Goal status: INITIAL  3.  Pt will improve Modified Oswestry score to </= 18% in order to demonstrate improved pain management and quality of life. Baseline: 28% Goal status: INITIAL  4.  Pt will demonstrate a negative left slump sign to demonstrate improved nerve mobility and pain management of LE and lumbar spine. Baseline: left slump + Goal status: INITIAL  PLAN:  PT FREQUENCY: 1x/week  PT DURATION: 4 weeks (pt preference)  PLANNED INTERVENTIONS: [id number]- PT Re-evaluation, [id number]-Therapeutic exercises, 97530- Therapeutic activity, [id number]- Neuromuscular re-education, 97535- Self Care, [id number]- Manual therapy, [id number]- Gait training, [id number]- Aquatic Therapy, [phone]- Electrical  stimulation (manual), Patient/Family education, Balance training, Stair training, Taping, Dry Needling, Joint mobilization, Spinal mobilization, Vestibular training, Cryotherapy, and Moist heat.  PLAN FOR NEXT SESSION: Pt was not interested in aquatics or TPDN on evaluation.  Sciatic nerve glides and hamstring stretch.  Low back stretching and core strengthening.   Proximal hip strengthening.  Initiate HEP and walking program.  TENS?  STM/manual techniques for pain management.   Sadie Haber, PT, DPT 05/26/2023, 10:35 PM

## 2023-05-29 ENCOUNTER — Ambulatory Visit: Admitting: Physician Assistant

## 2023-05-29 ENCOUNTER — Encounter: Payer: Self-pay | Admitting: Physician Assistant

## 2023-05-29 VITALS — BP 133/83 | HR 92 | Ht 63.0 in | Wt 172.2 lb

## 2023-05-29 DIAGNOSIS — F419 Anxiety disorder, unspecified: Secondary | ICD-10-CM

## 2023-05-29 DIAGNOSIS — G35 Multiple sclerosis: Secondary | ICD-10-CM

## 2023-05-29 DIAGNOSIS — D72829 Elevated white blood cell count, unspecified: Secondary | ICD-10-CM | POA: Insufficient documentation

## 2023-05-29 DIAGNOSIS — F32A Depression, unspecified: Secondary | ICD-10-CM

## 2023-05-29 MED ORDER — BUSPIRONE HCL 5 MG PO TABS: 5.0000 mg | ORAL_TABLET | Freq: Two times a day (BID) | ORAL | 1 refills | Status: DC

## 2023-05-29 NOTE — Progress Notes (Signed)
 New patient visit   Patient: Janet Duran   DOB: 07/21/1995   27 y.o. Female  MRN: [id number] Visit Date: 05/29/2023  Today's healthcare provider: Alfredia Ferguson, PA-C   Cc. New patient  Subjective    Janet Duran is a 28 y.o. female who presents today as a new patient to establish care.   Discussed the use of AI scribe software for clinical note transcription with the patient, who gave verbal consent to proceed.  History of Present Illness   The patient, with a history of Multiple Sclerosis (MS), anxiety, and depression, presents for a new patient visit. She has been on Lexapro for about a year for her anxiety and depression, but reports that it does not seem to be helping much. The patient has tried several different medications in the past for her mental health issues, but none have been particularly effective. She has not seen a psychiatrist as an adult and is not currently in therapy. The patient also reports a persistently high white blood cell count, the cause of which is unclear. She recently had an MRI and was prescribed antibiotics for sinusitis. 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       Past Medical History:  Diagnosis Date   Anxiety    Depression    Ectopic beat    Ectopic heartbeat    Hypertension    Less than [redacted] weeks gestation of pregnancy 09/27/2021   Migraine    Multiple sclerosis (HCC)    Urinary urgency 12/26/2019   Past Surgical History:  Procedure Laterality Date   NO PAST SURGERIES     Family Status  Relation Name Status   Mother  Alive   Father  Alive  No partnership data on file   Family History  Adopted: Yes   Social History   Socioeconomic History   Marital status: Single    Spouse name: Not on file   Number of children: 0   Years of education: Bachelors    Highest education level: Bachelor's degree (e.g., BA, AB, BS)  Occupational History   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    Tobacco Use   Smoking status: Never   Smokeless tobacco: Never  Vaping Use   Vaping status: Never Used  Substance and Sexual Activity   Alcohol use: Yes    Comment: rare   Drug use: Never   Sexual activity: Yes    Birth control/protection: Pill  Other Topics Concern   Not on file  Social History Narrative   Right handed    Lives with boyfriend   Caffeine use: Soda daily ( 1 per day)   Social Drivers of Corporate investment banker Strain: Low Risk  (12/28/2022)   Overall Financial Resource Strain (CARDIA)    Difficulty of Paying Living Expenses: Not very hard  Food Insecurity: No Food Insecurity (12/28/2022)   Hunger Vital Sign    Worried About Running Out of Food in the Last Year: Never true    Ran Out of Food in the Last Year: Never true  Transportation Needs: No Transportation Needs (12/28/2022)   PRAPARE - Administrator, Civil Service (Medical): No    Lack of Transportation (Non-Medical): No  Physical Activity: Insufficiently Active (12/28/2022)   Exercise Vital Sign    Days of Exercise per Week: 2 days    Minutes of Exercise per Session: 30 min  Stress: Stress Concern Present (12/28/2022)   [place]  [employer] - Occupational Stress Questionnaire    Feeling of Stress : Very much  Social Connections: Socially Isolated (12/28/2022)   Social Connection and Isolation Panel [NHANES]    Frequency of Communication with Friends and Family: Three times a week    Frequency of Social Gatherings with Friends and Family: Once a week    Attends Religious Services: Never    Database administrator or Organizations: No    Attends Engineer, structural: Not on file    Marital Status: Never married   Outpatient Medications Prior to Visit  Medication Sig   escitalopram (LEXAPRO) 20 MG tablet Take 1 tablet (20 mg total) by mouth daily.   natalizumab (TYSABRI) 300 MG/15ML injection Inject into the vein.   norethindrone (MICRONOR) 0.35 MG tablet  Take 1 tablet (0.35 mg total) by mouth daily.   phentermine 30 MG capsule TAKE 1 CAPSULE BY MOUTH EVERY DAY IN THE MORNING   solifenacin (VESICARE) 10 MG tablet TAKE 1 TABLET BY MOUTH EVERY DAY   [DISCONTINUED] azithromycin (ZITHROMAX Z-PAK) 250 MG tablet Take 2 p.o. on the first day and then 1 p.o. daily until complete   [DISCONTINUED] Vitamin D, Ergocalciferol, (DRISDOL) 1.25 MG (50000 UNIT) CAPS capsule Take 1 capsule (50,000 Units total) by mouth every 7 (seven) days.   No facility-administered medications prior to visit.   No Known Allergies  Immunization History  Administered Date(s) Administered   DTaP 10/03/1995, 02/12/1996, 05/22/1996, 12/09/1998   HIB (PRP-OMP) 10/03/1995, 02/12/1996, 05/22/1996, 12/09/1998   Hepatitis A 07/30/2007, 09/22/2009   Hepatitis B 08/18/1995, 08/29/1995, 02/12/1996   Hpv-Unspecified 07/30/2007, 09/22/2009, 07/23/2013   IPV 10/03/1995, 11/30/1995, 12/09/1998, 06/27/2006   MMR 12/09/1998, 06/27/2006   Meningococcal Conjugate 03/16/2007, 07/23/2013   Td 06/27/2006, 07/23/2013   Tdap 07/23/2013    Health Maintenance  Topic Date Due   COVID-19 Vaccine (1) Never done   Pneumococcal Vaccine 63-13 Years old (1 of 2 - PCV) Never done   DTaP/Tdap/Td (8 - Td or Tdap) 07/24/2023   INFLUENZA VACCINE  09/22/2023   Cervical Cancer Screening (Pap smear)  12/31/2023   HPV VACCINES  Completed   Hepatitis C Screening  Completed   HIV Screening  Completed    Patient Care Team: Alfredia Ferguson, PA-C as PCP - General (Physician Assistant)  Review of Systems  Constitutional:  Negative for fatigue and fever.  Respiratory:  Negative for cough and shortness of breath.   Cardiovascular:  Negative for chest pain and leg swelling.  Gastrointestinal:  Negative for abdominal pain.  Neurological:  Negative for dizziness and headaches.  Psychiatric/Behavioral:  The patient is nervous/anxious.         Objective    BP 133/83   Pulse 92   Ht 5\' 3"  (1.6 m)   Wt  172 lb 3.2 oz (78.1 kg)   LMP 05/12/2023 (Exact Date)   BMI 30.50 kg/m     Physical Exam Constitutional:      General: She is awake.     Appearance: She is well-developed.  HENT:     Head: Normocephalic.  Eyes:     Conjunctiva/sclera: Conjunctivae normal.  Cardiovascular:     Rate and Rhythm: Normal rate and regular rhythm.     Heart sounds: Normal heart sounds.  Pulmonary:     Effort: Pulmonary effort is normal.     Breath sounds: Normal breath sounds.  Skin:    General: Skin is warm.  Neurological:     Mental Status: She is alert  and oriented to person, place, and time.  Psychiatric:        Attention and Perception: Attention normal.        Mood and Affect: Mood normal.        Speech: Speech normal.        Behavior: Behavior is cooperative.    Depression Screen    05/29/2023    9:41 AM 01/05/2023    8:48 AM 10/04/2022    9:33 AM 03/25/2022   11:04 AM  PHQ 2/9 Scores  PHQ - 2 Score 5 4 6 2   PHQ- 9 Score 17 19 19     No results found for any visits on 05/29/23.  Assessment & Plan     Anxiety and depression Assessment & Plan: Manages with lexapro 20 mg . 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  Anxiety/depression uncontrolled. Adding buspar 5 mg bid today, referring to psychiatry.  Orders: -     busPIRone HCl; Take 1 tablet (5 mg total) by mouth 2 (two) times daily.  Dispense: 60 tablet; Refill: 1 -     Ambulatory referral to Psychiatry  Leukocytosis, unspecified type Assessment & Plan: W/u had r/o CLL.  Last cbc, wbc very elevated, pt was then tx for sinusitis. Recommending repeat end of this month.   Orders: -     CBC with Differential/Platelet; Future  Multiple sclerosis [facility]) Assessment & Plan: F/b neurology Manages with Donnamarie Poag-- thought to contribute to her leukocytosis. Bladder urgency w/ solifancin Fatigue w/ phentermine    Return in about 6 weeks (around 07/10/2023) for anxiety, depression.     Alfredia Ferguson, PA-C  [facility] at [facility] [phone] (phone) [phone] (fax)  [facility]

## 2023-05-29 NOTE — Assessment & Plan Note (Signed)
 W/u had r/o CLL.  Last cbc, wbc very elevated, pt was then tx for sinusitis. Recommending repeat end of this month.

## 2023-05-29 NOTE — Assessment & Plan Note (Signed)
 Manages with lexapro 20 mg . 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  Anxiety/depression uncontrolled. Adding buspar 5 mg bid today, referring to psychiatry.

## 2023-05-29 NOTE — Assessment & Plan Note (Signed)
 F/b neurology Manages with Donnamarie Poag-- thought to contribute to her leukocytosis. Bladder urgency w/ solifancin Fatigue w/ phentermine

## 2023-06-12 ENCOUNTER — Other Ambulatory Visit: Payer: Self-pay | Admitting: Physician Assistant

## 2023-06-12 DIAGNOSIS — F32A Depression, unspecified: Secondary | ICD-10-CM

## 2023-06-13 ENCOUNTER — Ambulatory Visit: Admitting: Nurse Practitioner

## 2023-06-16 ENCOUNTER — Encounter: Payer: Self-pay | Admitting: Physical Therapy

## 2023-06-16 ENCOUNTER — Ambulatory Visit: Admitting: Physical Therapy

## 2023-06-16 VITALS — BP 134/82 | HR 84

## 2023-06-16 DIAGNOSIS — R202 Paresthesia of skin: Secondary | ICD-10-CM

## 2023-06-16 DIAGNOSIS — R252 Cramp and spasm: Secondary | ICD-10-CM

## 2023-06-16 DIAGNOSIS — M62830 Muscle spasm of back: Secondary | ICD-10-CM

## 2023-06-16 DIAGNOSIS — M5459 Other low back pain: Secondary | ICD-10-CM

## 2023-06-16 DIAGNOSIS — M546 Pain in thoracic spine: Secondary | ICD-10-CM

## 2023-06-16 NOTE — Patient Instructions (Signed)
 Access Code: [email or web address] URL: [email or web address] Date: 06/16/2023 Prepared by: Marilou Showman  Exercises - Seated Hamstring Stretch  - 1 x daily - 4 x weekly - 1 sets - 3 reps - 45 seconds hold - Long Sitting Calf Stretch with Strap  - 1 x daily - 4 x weekly - 1 sets - 3 reps - 45 seconds hold - Supine Sciatic Nerve Glide  - 1 x daily - 4 x weekly - 3 sets - 20 reps - Seated Piriformis Stretch with Trunk Bend  - 1 x daily - 4 x weekly - 1 sets - 3 reps - 45 seconds hold - Supine Piriformis Stretch with Leg Straight  - 1 x daily - 4 x weekly - 1 sets - 3 reps - 1 minute hold

## 2023-06-16 NOTE — Therapy (Signed)
 OUTPATIENT PHYSICAL THERAPY THORACOLUMBAR TREATMENT   Patient Name: Janet Duran MRN: [id number] DOB:1995-12-18, 28 y.o., female Today's Date: 06/16/2023  END OF SESSION:  PT End of Session - 06/16/23 1506     Visit Number 2    Number of Visits 5   4 + eval   Date for PT Re-Evaluation 07/14/23   pushed out due to pt scheduling limitations   Authorization Type [employer]    PT Start Time 1501   patient arrived late   PT Stop Time 1540    PT Time Calculation (min) 39 min    Activity Tolerance Patient tolerated treatment well    Behavior During Therapy [facility] for tasks assessed/performed             Past Medical History:  Diagnosis Date   Anxiety    Depression    Ectopic beat    Ectopic heartbeat    Hypertension    Less than [redacted] weeks gestation of pregnancy 09/27/2021   Migraine    Multiple sclerosis (HCC)    Urinary urgency 12/26/2019   Past Surgical History:  Procedure Laterality Date   NO PAST SURGERIES     Patient Active Problem List   Diagnosis Date Noted   Leukocytosis 05/29/2023   Hepatic steatosis 01/05/2023   Acne vulgaris 10/04/2022   Hypertriglyceridemia 04/05/2022   Hyperglycemia 04/05/2022   Liver nodule 07/05/2021   Elevated liver transaminase level 06/24/2020   High risk medication use 12/26/2019   Anxiety and depression 09/03/2019   Class 1 obesity without serious comorbidity with body mass index (BMI) of 34.0 to 34.9 in adult 09/03/2019   B12 deficiency 05/27/2019   Multiple sclerosis (HCC) 05/05/2019   Vitamin D  deficiency 05/05/2019   Migraine without aura and without status migrainosus, not intractable 03/04/2019   Ectopic heartbeat     PCP: Trenton Frock, PA-C (first appt with this provider scheduled week of April 7)  REFERRING PROVIDER: Terrilyn Fick, NP  REFERRING DIAG: M54.50,G89.29 (ICD-10-CM) - Chronic bilateral low back pain without sciatica  Rationale for Evaluation and Treatment: Rehabilitation  THERAPY DIAG:   Other low back pain  Cramp and spasm  Pain in thoracic spine  Paresthesia of skin  Muscle spasm of back  ONSET DATE: 15 years  SUBJECTIVE:                                                                                                                                                                                           SUBJECTIVE STATEMENT: Pt reports she has a low level headache this visit.  Denies falls or acute changes.    PERTINENT HISTORY:  RRMS diagnosed in 2021?, depression, CLL, HTN, migraines, dizziness, elevated liver enzymes  PAIN:  Are you having pain? Yes: NPRS scale: 2 (current) Pain location: headache Pain description: headache Aggravating factors: "not really" Relieving factors: nothing  PRECAUTIONS: None  RED FLAGS: None   WEIGHT BEARING RESTRICTIONS: No  FALLS:  Has patient fallen in last 6 months? No  LIVING ENVIRONMENT: Lives with: lives alone Lives in: House/apartment Stairs: No Has following equipment at home: None  OCCUPATION: Child psychotherapist  PLOF: Independent  PATIENT GOALS: "I'm open minded"  NEXT MD VISIT: 05/29/2023 Trenton Frock, PA-C (Fam Med)  OBJECTIVE:  Note: Objective measures were completed at Evaluation unless otherwise noted.  DIAGNOSTIC FINDINGS:  Brain MRI 05/16/2023 IMPRESSION: This MRI of the brain with and without contrast shows the following: Scattered T2/FLAIR hyperintense foci of the cerebral hemispheres and a focus in the left thalamus and probably in the right cerebellar hemisphere.  These are consistent with chronic demyelinating plaque associated with multiple sclerosis.  None of the foci enhance after contrast.  Compared to the MRI from 07/20/2021, there were no new lesions. Chronic pansinusitis as detailed above No acute findings. Normal enhancement pattern.  Cervical MRI 05/16/2023 IMPRESSION: This MRI of the cervical spine with and without contrast shows the following: There is a T2 hyperintense  focus within the spinal cord toward the left adjacent to C6, unchanged in appearance compared to the MRI from 2023.  It is consistent with a chronic demyelinating plaque associated with multiple sclerosis.  It does not enhance another plaque is also noted in the right cerebellar hemisphere.  No new lesions are noted. Negligible disc degenerative changes C4-C5 does not lead to spinal stenosis or nerve root compression. Normal enhancement pattern.  PATIENT SURVEYS:  Modified Oswestry 14/50 = 28% (low perceived disability)   COGNITION: Overall cognitive status: Within functional limits for tasks assessed     SENSATION: Light touch: WFL; pt states she has paresthesias randomly in varying areas of lower limbs  POSTURE: forward head  PALPATION: No abnormal paraspinal tenderness or tightness noted, but pt reports tenderness at cervicothoracic junction, mid-lumber spinous processes and lumbosacral junction  LUMBAR ROM:   AROM eval  Flexion WFL  Extension "; lumbar achiness  Right lateral flexion "; lumbar achiness  Left lateral flexion "; lumbar achiness  Right rotation WFL  Left rotation WFL   (Blank rows = not tested)  LOWER EXTREMITY ROM/MMT:     Active  Right eval Left eval  Hip flexion 4/5 4/5  Hip extension    Hip abduction 4+/5 4+/5  Hip adduction    Hip internal rotation    Hip external rotation    Knee flexion 4/5 4/5  Knee extension 5/5 5/5  Ankle dorsiflexion 5/5 5/5  Ankle plantarflexion 5/5 5/5  Ankle inversion    Ankle eversion     (Blank rows = not tested)  LUMBAR SPECIAL TESTS:  Slump test: + left only, FABER test: Negative, and Trendelenburg sign: Negative  FUNCTIONAL TESTS:  5 times sit to stand: 10.87 sec no UE support MCTSIB: Condition 1: Avg of 3 trials: 30 sec, Condition 2: Avg of 3 trials: 30 sec, Condition 3: Avg of 3 trials: 30 sec, Condition 4: Avg of 3 trials: 30; mild sway sec, and Total Score: 120/120  GAIT: Distance walked: Various clinic  distances Assistive device utilized: None Level of assistance: Complete Independence Comments: No notable impairments/abnormalities.  TREATMENT DATE: 06/16/2023                                                                                                                           -  Bilateral alternating hamstring stretch 3x45 seconds each side -Long-sitting gastroc stretch 3x45 seconds each side; pt notes more left tightness -Sciatic nerve glides 2x20 each LE using towel support at posterior thigh - discussed modifications for comfort and prolonged holds to tension nerve -Hook-lying piriformis stretch w/o challenge > deepened hook-lying position w/ mild challenge > seated piriformis stretch w/ forward lean 3x45 sec each side w/ moderate challenge; right is reportedly tighter in sitting position -Supine SKTC 2x1 minute, pt reports very mild stretch -Piriformis stretch w/ contralateral leg straight 2x1 minute each LE; pt feels moderate to severe stretch in outer hip/ITB/glut  PATIENT EDUCATION:  Education details: Initial HEP.  Sciatic nerve route and segmental stretching purpose.  Nerve glides vs tensioner for progression. Person educated: Patient Education method: Explanation Education comprehension: verbalized understanding  HOME EXERCISE PROGRAM: Access Code: [email or web address] URL: [email or web address] Date: 06/16/2023 Prepared by: Marilou Showman  Exercises - Seated Hamstring Stretch  - 1 x daily - 4 x weekly - 1 sets - 3 reps - 45 seconds hold - Long Sitting Calf Stretch with Strap  - 1 x daily - 4 x weekly - 1 sets - 3 reps - 45 seconds hold - Supine Sciatic Nerve Glide  - 1 x daily - 4 x weekly - 3 sets - 20 reps - Seated Piriformis Stretch with Trunk Bend  - 1 x daily - 4 x weekly - 1 sets - 3 reps - 45 seconds hold - Supine Piriformis Stretch with Leg Straight  - 1 x daily - 4 x weekly - 1 sets - 3 reps - 1 minute hold  ASSESSMENT:  CLINICAL IMPRESSION: Focus of  skilled session today on improving general low back and BLE mobility with home based stretching program.  She does have some sciatic nerve pattern symptoms with nerve glides, worse with tensioner.  RLE reportedly tighter proximally and LLE tighter distally.  She would benefit from core stabilization to maintain balance and improve low back pain with daily tasks.  Continue per POC.  OBJECTIVE IMPAIRMENTS: decreased strength, dizziness, increased muscle spasms, impaired sensation, improper body mechanics, postural dysfunction, and pain.   ACTIVITY LIMITATIONS: lifting, squatting, and locomotion level  PARTICIPATION LIMITATIONS: community activity  PERSONAL FACTORS: Fitness, Past/current experiences, Time since onset of injury/illness/exacerbation, and 1-2 comorbidities: MS, migraines  are also affecting patient's functional outcome.   REHAB POTENTIAL: Good  CLINICAL DECISION MAKING: Evolving/moderate complexity  EVALUATION COMPLEXITY: Moderate   GOALS: Goals reviewed with patient? Yes  SHORT TERM GOALS = LONG TERM GOALS: Target date: 07/07/2023 (pt scheduled 2 weeks out from eval)  Pt will be independent and compliant with strengthening and stretching HEP in order to maintain functional progress and improve mobility. Baseline:  To be established. Goal status: INITIAL  2.  Patient will be compliant to formal walking program >/= 3 days per week to improve aerobic tolerance and ambulatory mechanics. Baseline: To be established. Goal status: INITIAL  3.  Pt will improve Modified Oswestry score to </= 18% in order to demonstrate improved pain management and quality of life. Baseline: 28% Goal status: INITIAL  4.  Pt will demonstrate a negative left slump sign to demonstrate improved nerve mobility and pain management of LE and lumbar spine. Baseline: left slump + Goal status: INITIAL  PLAN:  PT FREQUENCY: 1x/week  PT DURATION: 4 weeks (pt preference)  PLANNED INTERVENTIONS: [id number]-  PT Re-evaluation, 97110-Therapeutic exercises, 97530- Therapeutic activity, [id number]- Neuromuscular re-education, 97535- Self Care, 11914- Manual therapy, [id number]- Gait training, [id number]- Aquatic Therapy, [phone]-  Electrical stimulation (manual), Patient/Family education, Balance training, Stair training, Taping, Dry Needling, Joint mobilization, Spinal mobilization, Vestibular training, Cryotherapy, and Moist heat.  PLAN FOR NEXT SESSION: Pt was not interested in aquatics or TPDN on evaluation.    Core strengthening.  Proximal hip strengthening.  Add to HEP and walking program.  TENS?  STM/manual techniques for pain management.   Earlean Glaze, PT, DPT 06/16/2023, 3:47 PM

## 2023-06-21 ENCOUNTER — Encounter: Payer: Self-pay | Admitting: Physician Assistant

## 2023-06-21 ENCOUNTER — Encounter (HOSPITAL_COMMUNITY): Payer: Self-pay | Admitting: Registered Nurse

## 2023-06-21 ENCOUNTER — Other Ambulatory Visit (INDEPENDENT_AMBULATORY_CARE_PROVIDER_SITE_OTHER)

## 2023-06-21 ENCOUNTER — Telehealth (INDEPENDENT_AMBULATORY_CARE_PROVIDER_SITE_OTHER): Admitting: Registered Nurse

## 2023-06-21 DIAGNOSIS — F332 Major depressive disorder, recurrent severe without psychotic features: Secondary | ICD-10-CM

## 2023-06-21 DIAGNOSIS — D72829 Elevated white blood cell count, unspecified: Secondary | ICD-10-CM

## 2023-06-21 DIAGNOSIS — F411 Generalized anxiety disorder: Secondary | ICD-10-CM | POA: Diagnosis not present

## 2023-06-21 LAB — CBC WITH DIFFERENTIAL/PLATELET
Basophils Absolute: 0.1 10*3/uL (ref 0.0–0.1)
Basophils Relative: 0.7 % (ref 0.0–3.0)
Eosinophils Absolute: 0.4 10*3/uL (ref 0.0–0.7)
Eosinophils Relative: 3 % (ref 0.0–5.0)
HCT: 40.7 % (ref 36.0–46.0)
Hemoglobin: 13.9 g/dL (ref 12.0–15.0)
Lymphocytes Relative: 49.5 % — ABNORMAL HIGH (ref 12.0–46.0)
Lymphs Abs: 6.8 10*3/uL — ABNORMAL HIGH (ref 0.7–4.0)
MCHC: 34.2 g/dL (ref 30.0–36.0)
MCV: 85.8 fl (ref 78.0–100.0)
Monocytes Absolute: 1.1 10*3/uL — ABNORMAL HIGH (ref 0.1–1.0)
Monocytes Relative: 7.9 % (ref 3.0–12.0)
Neutro Abs: 5.3 10*3/uL (ref 1.4–7.7)
Neutrophils Relative %: 38.9 % — ABNORMAL LOW (ref 43.0–77.0)
Platelets: 338 10*3/uL (ref 150.0–400.0)
RBC: 4.74 Mil/uL (ref 3.87–5.11)
RDW: 13.3 % (ref 11.5–15.5)
WBC: 13.7 10*3/uL — ABNORMAL HIGH (ref 4.0–10.5)

## 2023-06-21 NOTE — Patient Instructions (Signed)
  The GeneSight Psychotropic test analyzes how your genes may affect your outcomes with medications commonly prescribed to treat depression, anxiety, ADHD, and other mental health conditions. The GeneSight Psychotropic test provides your clinician with information about which medications may require dose adjustments, may be less likely to work for you or may have an increased risk of side effects based on your genetic makeup.       For more information visit the GeneSight website:  [email or web address]
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
## 2023-06-22 ENCOUNTER — Ambulatory Visit: Attending: Family Medicine | Admitting: Physical Therapy

## 2023-06-22 ENCOUNTER — Encounter: Payer: Self-pay | Admitting: Physical Therapy

## 2023-06-22 VITALS — BP 135/95 | HR 107

## 2023-06-22 DIAGNOSIS — R202 Paresthesia of skin: Secondary | ICD-10-CM | POA: Diagnosis present

## 2023-06-22 DIAGNOSIS — R252 Cramp and spasm: Secondary | ICD-10-CM | POA: Diagnosis present

## 2023-06-22 DIAGNOSIS — M5459 Other low back pain: Secondary | ICD-10-CM | POA: Insufficient documentation

## 2023-06-22 DIAGNOSIS — M546 Pain in thoracic spine: Secondary | ICD-10-CM | POA: Insufficient documentation

## 2023-06-22 DIAGNOSIS — M62830 Muscle spasm of back: Secondary | ICD-10-CM | POA: Diagnosis present

## 2023-06-22 NOTE — Patient Instructions (Addendum)
 Access Code: [email or web address] URL: [email or web address] Date: 06/16/2023 Prepared by: Marilou Showman  Exercises - Seated Hamstring Stretch  - 1 x daily - 4 x weekly - 1 sets - 3 reps - 45 seconds hold - Long Sitting Calf Stretch with Strap  - 1 x daily - 4 x weekly - 1 sets - 3 reps - 45 seconds hold - Supine Sciatic Nerve Glide  - 1 x daily - 4 x weekly - 3 sets - 20 reps - Seated Piriformis Stretch with Trunk Bend  - 1 x daily - 4 x weekly - 1 sets - 3 reps - 45 seconds hold - Supine Piriformis Stretch with Leg Straight  - 1 x daily - 4 x weekly - 1 sets - 3 reps - 1 minute hold - Supine Bridge  - 1 x daily - 4 x weekly - 2 sets - 10 reps - Clamshell with Resistance  - 1 x daily - 4 x weekly - 2 sets - 10 reps - Bird Dog  - 1 x daily - 4 x weekly - 2 sets - 10 reps  You Can Walk For A Certain Length Of Time Each Day (4 days a week goal)                          Walk 10 minutes 1-2 times per day.             Increase 5  minutes every 7 days              Work up to 20-30 minutes (1-2 times per day).               Example:                         Day 1-2           4-5 minutes     3 times per day                         Day 7-8           10-12 minutes 2-3 times per day                         Day 13-14       20-22 minutes 1-2 times per day

## 2023-06-22 NOTE — Therapy (Signed)
 OUTPATIENT PHYSICAL THERAPY THORACOLUMBAR TREATMENT   Patient Name: Janet Duran MRN: [id number] DOB:1995-04-20, 28 y.o., female Today's Date: 06/22/2023  END OF SESSION:  PT End of Session - 06/22/23 1539     Visit Number 3    Number of Visits 5   4 + eval   Date for PT Re-Evaluation 07/14/23   pushed out due to pt scheduling limitations   Authorization Type [employer]    PT Start Time [phone]    PT Stop Time 1625    PT Time Calculation (min) 53 min    Activity Tolerance Patient tolerated treatment well    Behavior During Therapy [facility] for tasks assessed/performed             Past Medical History:  Diagnosis Date   Anxiety    Depression    Ectopic beat    Ectopic heartbeat    Hypertension    Less than [redacted] weeks gestation of pregnancy 09/27/2021   Migraine    Multiple sclerosis (HCC)    Urinary urgency 12/26/2019   Past Surgical History:  Procedure Laterality Date   NO PAST SURGERIES     Patient Active Problem List   Diagnosis Date Noted   Leukocytosis 05/29/2023   Hepatic steatosis 01/05/2023   Acne vulgaris 10/04/2022   Hypertriglyceridemia 04/05/2022   Hyperglycemia 04/05/2022   Liver nodule 07/05/2021   Elevated liver transaminase level 06/24/2020   High risk medication use 12/26/2019   Anxiety and depression 09/03/2019   Class 1 obesity without serious comorbidity with body mass index (BMI) of 34.0 to 34.9 in adult 09/03/2019   B12 deficiency 05/27/2019   Multiple sclerosis (HCC) 05/05/2019   Vitamin D  deficiency 05/05/2019   Migraine without aura and without status migrainosus, not intractable 03/04/2019   Ectopic heartbeat     PCP: Trenton Frock, PA-C (first appt with this provider scheduled week of April 7)  REFERRING PROVIDER: Terrilyn Fick, NP  REFERRING DIAG: M54.50,G89.29 (ICD-10-CM) - Chronic bilateral low back pain without sciatica  Rationale for Evaluation and Treatment: Rehabilitation  THERAPY DIAG:  Other low back  pain  Cramp and spasm  Pain in thoracic spine  Paresthesia of skin  Muscle spasm of back  ONSET DATE: 15 years  SUBJECTIVE:                                                                                                                                                                                           SUBJECTIVE STATEMENT: Pt reports she has another low level headache this visit that has been ongoing all day today.  Denies falls or acute changes.  PERTINENT HISTORY:  RRMS diagnosed in 2021?, depression, CLL, HTN, migraines, dizziness, elevated liver enzymes  PAIN:  Are you having pain? Yes: NPRS scale: 3 (current) Pain location: headache Pain description: headache Aggravating factors: "not really" Relieving factors: nothing  PRECAUTIONS: None  RED FLAGS: None   WEIGHT BEARING RESTRICTIONS: No  FALLS:  Has patient fallen in last 6 months? No  LIVING ENVIRONMENT: Lives with: lives alone Lives in: House/apartment Stairs: No Has following equipment at home: None  OCCUPATION: Child psychotherapist  PLOF: Independent  PATIENT GOALS: "I'm open minded"  NEXT MD VISIT: 05/29/2023 Trenton Frock, PA-C (Fam Med)  OBJECTIVE:  Note: Objective measures were completed at Evaluation unless otherwise noted.  DIAGNOSTIC FINDINGS:  Brain MRI 05/16/2023 IMPRESSION: This MRI of the brain with and without contrast shows the following: Scattered T2/FLAIR hyperintense foci of the cerebral hemispheres and a focus in the left thalamus and probably in the right cerebellar hemisphere.  These are consistent with chronic demyelinating plaque associated with multiple sclerosis.  None of the foci enhance after contrast.  Compared to the MRI from 07/20/2021, there were no new lesions. Chronic pansinusitis as detailed above No acute findings. Normal enhancement pattern.  Cervical MRI 05/16/2023 IMPRESSION: This MRI of the cervical spine with and without contrast shows the following: There  is a T2 hyperintense focus within the spinal cord toward the left adjacent to C6, unchanged in appearance compared to the MRI from 2023.  It is consistent with a chronic demyelinating plaque associated with multiple sclerosis.  It does not enhance another plaque is also noted in the right cerebellar hemisphere.  No new lesions are noted. Negligible disc degenerative changes C4-C5 does not lead to spinal stenosis or nerve root compression. Normal enhancement pattern.  PATIENT SURVEYS:  Modified Oswestry 14/50 = 28% (low perceived disability)   COGNITION: Overall cognitive status: Within functional limits for tasks assessed     SENSATION: Light touch: WFL; pt states she has paresthesias randomly in varying areas of lower limbs  POSTURE: forward head  PALPATION: No abnormal paraspinal tenderness or tightness noted, but pt reports tenderness at cervicothoracic junction, mid-lumber spinous processes and lumbosacral junction  LUMBAR ROM:   AROM eval  Flexion WFL  Extension "; lumbar achiness  Right lateral flexion "; lumbar achiness  Left lateral flexion "; lumbar achiness  Right rotation WFL  Left rotation WFL   (Blank rows = not tested)  LOWER EXTREMITY ROM/MMT:     Active  Right eval Left eval  Hip flexion 4/5 4/5  Hip extension    Hip abduction 4+/5 4+/5  Hip adduction    Hip internal rotation    Hip external rotation    Knee flexion 4/5 4/5  Knee extension 5/5 5/5  Ankle dorsiflexion 5/5 5/5  Ankle plantarflexion 5/5 5/5  Ankle inversion    Ankle eversion     (Blank rows = not tested)  LUMBAR SPECIAL TESTS:  Slump test: + left only, FABER test: Negative, and Trendelenburg sign: Negative  FUNCTIONAL TESTS:  5 times sit to stand: 10.87 sec no UE support MCTSIB: Condition 1: Avg of 3 trials: 30 sec, Condition 2: Avg of 3 trials: 30 sec, Condition 3: Avg of 3 trials: 30 sec, Condition 4: Avg of 3 trials: 30; mild sway sec, and Total Score: 120/120  GAIT: Distance  walked: Various clinic distances Assistive device utilized: None Level of assistance: Complete Independence Comments: No notable impairments/abnormalities.  TREATMENT DATE: 06/22/2023                                                                                                                           -  Lumbar rollouts w/ large physioball x10 forward > left > right w/ cues for paced breathing -Deadbug 2x10, cued for TrA activation -Bridges 2x10 -Side-lying clamshells 2x10 w/ red resistance band -Quadruped cat/cow x20, pt has mildly increased headache with head movements that resolves w/ rest - good midback mobility noted -Birddog 2x10, some functional left hip weakness noted during right hip extension on second round, cuing to improve rotation compensation -Child's pose 3x20 seconds -3lb overhead press w/ contralateral standing march 2x10 each side -Lateral standing crunch w/ 3lb dumbbell 2x10 each side -Standing 8lb slam ball x115' w/ single standing rest break > rotating slam ball x30 alternating sides  You Can Walk For A Certain Length Of Time Each Day (4 days a week goal)                          Walk 10 minutes 1-2 times per day.             Increase 5  minutes every 7 days              Work up to 20-30 minutes (1-2 times per day).               Example:                         Day 1-2           4-5 minutes     3 times per day                         Day 7-8           10-12 minutes 2-3 times per day                         Day 13-14       20-22 minutes 1-2 times per day   PATIENT EDUCATION:  Education details: Additions to HEP.  Walking program. Person educated: Patient Education method: Explanation Education comprehension: verbalized understanding  HOME EXERCISE PROGRAM: Access Code: [email or web address] URL: [email or web address] Date: 06/16/2023 Prepared by: Marilou Showman  Exercises - Seated Hamstring Stretch  - 1 x daily - 4 x weekly - 1 sets - 3 reps - 45  seconds hold - Long Sitting Calf Stretch with Strap  - 1 x daily - 4 x weekly - 1 sets - 3 reps - 45 seconds hold - Supine Sciatic Nerve Glide  - 1 x daily - 4 x weekly - 3 sets - 20 reps - Seated Piriformis Stretch with Trunk Bend  - 1 x daily - 4 x weekly - 1 sets - 3 reps - 45 seconds hold - Supine Piriformis Stretch with Leg Straight  - 1 x daily - 4 x weekly - 1 sets - 3 reps - 1 minute hold - Supine Bridge  - 1 x daily - 4 x weekly - 2 sets - 10 reps - Clamshell with Resistance  - 1 x daily - 4 x weekly - 2 sets - 10 reps - Bird Dog  - 1 x daily - 4 x weekly - 2 sets - 10 reps  You Can Walk For A Certain Length Of Time Each Day (4 days a week goal)  Walk 10 minutes 1-2 times per day.             Increase 5  minutes every 7 days              Work up to 20-30 minutes (1-2 times per day).               Example:                         Day 1-2           4-5 minutes     3 times per day                         Day 7-8           10-12 minutes 2-3 times per day                         Day 13-14       20-22 minutes 1-2 times per day  ASSESSMENT:  CLINICAL IMPRESSION: Focus of skilled session today on core stability to improve back pain.  Some movement patterns impacted her headache so these were not added to her HEP.  Her midback mobility appeared WNL during cat/cow exercise, but she has notable left functional hip weakness during birddog task.  PT to continue expanding HEP as needed to improve self-management of symptoms.  Continue per POC.  OBJECTIVE IMPAIRMENTS: decreased strength, dizziness, increased muscle spasms, impaired sensation, improper body mechanics, postural dysfunction, and pain.   ACTIVITY LIMITATIONS: lifting, squatting, and locomotion level  PARTICIPATION LIMITATIONS: community activity  PERSONAL FACTORS: Fitness, Past/current experiences, Time since onset of injury/illness/exacerbation, and 1-2 comorbidities: MS, migraines  are also affecting  patient's functional outcome.   REHAB POTENTIAL: Good  CLINICAL DECISION MAKING: Evolving/moderate complexity  EVALUATION COMPLEXITY: Moderate   GOALS: Goals reviewed with patient? Yes  SHORT TERM GOALS = LONG TERM GOALS: Target date: 07/07/2023 (pt scheduled 2 weeks out from eval)  Pt will be independent and compliant with strengthening and stretching HEP in order to maintain functional progress and improve mobility. Baseline:  To be established. Goal status: INITIAL  2.  Patient will be compliant to formal walking program >/= 3 days per week to improve aerobic tolerance and ambulatory mechanics. Baseline: To be established. Goal status: INITIAL  3.  Pt will improve Modified Oswestry score to </= 18% in order to demonstrate improved pain management and quality of life. Baseline: 28% Goal status: INITIAL  4.  Pt will demonstrate a negative left slump sign to demonstrate improved nerve mobility and pain management of LE and lumbar spine. Baseline: left slump + Goal status: INITIAL  PLAN:  PT FREQUENCY: 1x/week  PT DURATION: 4 weeks (pt preference)  PLANNED INTERVENTIONS: 11914- PT Re-evaluation, 97110-Therapeutic exercises, 97530- Therapeutic activity, 97112- Neuromuscular re-education, 97535- Self Care, 78295- Manual therapy, [phone]- Gait training, [phone]- Aquatic Therapy, [phone]- Electrical stimulation (manual), Patient/Family education, Balance training, Stair training, Taping, Dry Needling, Joint mobilization, Spinal mobilization, Vestibular training, Cryotherapy, and Moist heat.  PLAN FOR NEXT SESSION: Pt was not interested in aquatics or TPDN on evaluation.    Core strengthening.  Proximal hip strengthening.  Add to HEP and walking program.  TENS?  STM/manual techniques for pain management.   Earlean Glaze, PT, DPT 06/22/2023, 4:55 PM

## 2023-06-26 ENCOUNTER — Ambulatory Visit: Admitting: Physical Therapy

## 2023-06-26 ENCOUNTER — Encounter: Payer: Self-pay | Admitting: Physical Therapy

## 2023-06-26 DIAGNOSIS — R252 Cramp and spasm: Secondary | ICD-10-CM

## 2023-06-26 DIAGNOSIS — M546 Pain in thoracic spine: Secondary | ICD-10-CM

## 2023-06-26 DIAGNOSIS — M5459 Other low back pain: Secondary | ICD-10-CM | POA: Diagnosis not present

## 2023-06-26 DIAGNOSIS — R202 Paresthesia of skin: Secondary | ICD-10-CM

## 2023-06-26 NOTE — Therapy (Signed)
 OUTPATIENT PHYSICAL THERAPY THORACOLUMBAR TREATMENT   Patient Name: Janet Duran MRN: [id number] DOB:[birth year], 28 y.o., female Today's Date: 06/26/2023  END OF SESSION:  PT End of Session - 06/26/23 1453     Visit Number 4    Number of Visits 5   4 + eval   Date for PT Re-Evaluation 07/14/23   pushed out due to pt scheduling limitations   Authorization Type [employer]    PT Start Time 1446    PT Stop Time 1530    PT Time Calculation (min) 44 min    Activity Tolerance Patient tolerated treatment well    Behavior During Therapy [facility] for tasks assessed/performed             Past Medical History:  Diagnosis Date   Anxiety    Depression    Ectopic beat    Ectopic heartbeat    Hypertension    Less than [redacted] weeks gestation of pregnancy 09/27/2021   Migraine    Multiple sclerosis (HCC)    Urinary urgency 12/26/2019   Past Surgical History:  Procedure Laterality Date   NO PAST SURGERIES     Patient Active Problem List   Diagnosis Date Noted   Leukocytosis 05/29/2023   Hepatic steatosis 01/05/2023   Acne vulgaris 10/04/2022   Hypertriglyceridemia 04/05/2022   Hyperglycemia 04/05/2022   Liver nodule 07/05/2021   Elevated liver transaminase level 06/24/2020   High risk medication use 12/26/2019   Anxiety and depression 09/03/2019   Class 1 obesity without serious comorbidity with body mass index (BMI) of 34.0 to 34.9 in adult 09/03/2019   B12 deficiency 05/27/2019   Multiple sclerosis (HCC) 05/05/2019   Vitamin D  deficiency 05/05/2019   Migraine without aura and without status migrainosus, not intractable 03/04/2019   Ectopic heartbeat     PCP: Trenton Frock, PA-C (first appt with this provider scheduled week of April 7)  REFERRING PROVIDER: Terrilyn Fick, NP  REFERRING DIAG: M54.50,G89.29 (ICD-10-CM) - Chronic bilateral low back pain without sciatica  Rationale for Evaluation and Treatment: Rehabilitation  THERAPY DIAG:  Other low back  pain  Cramp and spasm  Pain in thoracic spine  Paresthesia of skin  ONSET DATE: 15 years  SUBJECTIVE:                                                                                                                                                                                           SUBJECTIVE STATEMENT: Pt reports she has another low level headache this visit and her low back pain is burning.  Denies falls or acute changes.    PERTINENT HISTORY:  RRMS diagnosed  in 2021?, depression, CLL, HTN, migraines, dizziness, elevated liver enzymes  PAIN:  Are you having pain? Yes: NPRS scale: 3 (current) Pain location: low back Pain description: throbbing, burning Aggravating factors: "not really" Relieving factors: nothing  PRECAUTIONS: None  RED FLAGS: None   WEIGHT BEARING RESTRICTIONS: No  FALLS:  Has patient fallen in last 6 months? No  LIVING ENVIRONMENT: Lives with: lives alone Lives in: House/apartment Stairs: No Has following equipment at home: None  OCCUPATION: Child psychotherapist  PLOF: Independent  PATIENT GOALS: "I'm open minded"  NEXT MD VISIT: 05/29/2023 Trenton Frock, PA-C (Fam Med)  OBJECTIVE:  Note: Objective measures were completed at Evaluation unless otherwise noted.  DIAGNOSTIC FINDINGS:  Brain MRI 05/16/2023 IMPRESSION: This MRI of the brain with and without contrast shows the following: Scattered T2/FLAIR hyperintense foci of the cerebral hemispheres and a focus in the left thalamus and probably in the right cerebellar hemisphere.  These are consistent with chronic demyelinating plaque associated with multiple sclerosis.  None of the foci enhance after contrast.  Compared to the MRI from 07/20/2021, there were no new lesions. Chronic pansinusitis as detailed above No acute findings. Normal enhancement pattern.  Cervical MRI 05/16/2023 IMPRESSION: This MRI of the cervical spine with and without contrast shows the following: There is a T2  hyperintense focus within the spinal cord toward the left adjacent to C6, unchanged in appearance compared to the MRI from 2023.  It is consistent with a chronic demyelinating plaque associated with multiple sclerosis.  It does not enhance another plaque is also noted in the right cerebellar hemisphere.  No new lesions are noted. Negligible disc degenerative changes C4-C5 does not lead to spinal stenosis or nerve root compression. Normal enhancement pattern.  PATIENT SURVEYS:  Modified Oswestry 14/50 = 28% (low perceived disability)   COGNITION: Overall cognitive status: Within functional limits for tasks assessed     SENSATION: Light touch: WFL; pt states she has paresthesias randomly in varying areas of lower limbs  POSTURE: forward head  PALPATION: No abnormal paraspinal tenderness or tightness noted, but pt reports tenderness at cervicothoracic junction, mid-lumber spinous processes and lumbosacral junction  LUMBAR ROM:   AROM eval  Flexion WFL  Extension "; lumbar achiness  Right lateral flexion "; lumbar achiness  Left lateral flexion "; lumbar achiness  Right rotation WFL  Left rotation WFL   (Blank rows = not tested)  LOWER EXTREMITY ROM/MMT:     Active  Right eval Left eval  Hip flexion 4/5 4/5  Hip extension    Hip abduction 4+/5 4+/5  Hip adduction    Hip internal rotation    Hip external rotation    Knee flexion 4/5 4/5  Knee extension 5/5 5/5  Ankle dorsiflexion 5/5 5/5  Ankle plantarflexion 5/5 5/5  Ankle inversion    Ankle eversion     (Blank rows = not tested)  LUMBAR SPECIAL TESTS:  Slump test: + left only, FABER test: Negative, and Trendelenburg sign: Negative  FUNCTIONAL TESTS:  5 times sit to stand: 10.87 sec no UE support MCTSIB: Condition 1: Avg of 3 trials: 30 sec, Condition 2: Avg of 3 trials: 30 sec, Condition 3: Avg of 3 trials: 30 sec, Condition 4: Avg of 3 trials: 30; mild sway sec, and Total Score: 120/120  GAIT: Distance walked:  Various clinic distances Assistive device utilized: None Level of assistance: Complete Independence Comments: No notable impairments/abnormalities.  TREATMENT DATE: 06/26/2023                                                                                                                           -  Assessed skin (clean, closed, and dry) prior to 2 channel electrode placement for low back pain using TENS - explained possible benefits and risk associated with modality.  Pt verbalized understanding.  Intensity at 16 on channel 1 (left) and 14 on channel 2 (right) progressed to 18 on left and 17 on right after initial 5 minutes.  TENS on x10 minutes during therex:  -3lb prone hamstring curls 2x12 each LE  -3lb bent knee hip extension in prone 3x10 each side  -Assessed skin following electrode removal - Clean, dry and closed, no redness or silvering of skin -Discussed TENS for home and OOP purchase if desired as pt felt it helped distract from pain when on.  Will provide further education if brings personal unit to next session.  -STM w/ tennis ball focusing on myofascial stretch and deep tissue work for pain management - education on various STM techniques for home and how to instruct partner to assist  PATIENT EDUCATION:  Education details: Additions to HEP.  Walking program.  See above for further.  Planning for discharge next session. Person educated: Patient Education method: Explanation Education comprehension: verbalized understanding  HOME EXERCISE PROGRAM: Access Code: [email or web address] URL: [email or web address] Date: 06/16/2023 Prepared by: Marilou Showman  Exercises - Seated Hamstring Stretch  - 1 x daily - 4 x weekly - 1 sets - 3 reps - 45 seconds hold - Long Sitting Calf Stretch with Strap  - 1 x daily - 4 x weekly - 1 sets - 3 reps - 45 seconds hold - Supine Sciatic Nerve Glide  - 1 x daily - 4 x weekly - 3 sets - 20 reps - Seated Piriformis Stretch with Trunk Bend  - 1  x daily - 4 x weekly - 1 sets - 3 reps - 45 seconds hold - Supine Piriformis Stretch with Leg Straight  - 1 x daily - 4 x weekly - 1 sets - 3 reps - 1 minute hold - Supine Bridge  - 1 x daily - 4 x weekly - 2 sets - 10 reps - Clamshell with Resistance  - 1 x daily - 4 x weekly - 2 sets - 10 reps - Bird Dog  - 1 x daily - 4 x weekly - 2 sets - 10 reps  You Can Walk For A Certain Length Of Time Each Day (4 days a week goal)                          Walk 10 minutes 1-2 times per day.             Increase 5  minutes every 7 days              Work up to 20-30 minutes (1-2 times per day).               Example:                         Day 1-2           4-5 minutes     3 times per day                         Day 7-8           10-12 minutes 2-3 times per day  Day 13-14       20-22 minutes 1-2 times per day  ASSESSMENT:  CLINICAL IMPRESSION: Emphasis of skilled PT session today on introducing pain management techniques including TENS and STM.  She has a positive pain response to both reporting decreased notable LBP during TENS stimulation and she is contemplating purchasing a personal device.  PT will make time to provide safety education if she brings device to clinic for last scheduled appt.  Provided further education on STM techniques utilized today for further home management of pain.  She is in agreement to plan to discharge from PT at next and final visit.  OBJECTIVE IMPAIRMENTS: decreased strength, dizziness, increased muscle spasms, impaired sensation, improper body mechanics, postural dysfunction, and pain.   ACTIVITY LIMITATIONS: lifting, squatting, and locomotion level  PARTICIPATION LIMITATIONS: community activity  PERSONAL FACTORS: Fitness, Past/current experiences, Time since onset of injury/illness/exacerbation, and 1-2 comorbidities: MS, migraines  are also affecting patient's functional outcome.   REHAB POTENTIAL: Good  CLINICAL DECISION MAKING:  Evolving/moderate complexity  EVALUATION COMPLEXITY: Moderate   GOALS: Goals reviewed with patient? Yes  SHORT TERM GOALS = LONG TERM GOALS: Target date: 07/07/2023 (pt scheduled 2 weeks out from eval)  Pt will be independent and compliant with strengthening and stretching HEP in order to maintain functional progress and improve mobility. Baseline:  To be established. Goal status: INITIAL  2.  Patient will be compliant to formal walking program >/= 3 days per week to improve aerobic tolerance and ambulatory mechanics. Baseline: To be established. Goal status: INITIAL  3.  Pt will improve Modified Oswestry score to </= 18% in order to demonstrate improved pain management and quality of life. Baseline: 28% Goal status: INITIAL  4.  Pt will demonstrate a negative left slump sign to demonstrate improved nerve mobility and pain management of LE and lumbar spine. Baseline: left slump + Goal status: INITIAL  PLAN:  PT FREQUENCY: 1x/week  PT DURATION: 4 weeks (pt preference)  PLANNED INTERVENTIONS: 16109- PT Re-evaluation, 97110-Therapeutic exercises, 97530- Therapeutic activity, 97112- Neuromuscular re-education, 97535- Self Care, 60454- Manual therapy, [phone]- Gait training, [phone]- Aquatic Therapy, [phone]- Electrical stimulation (manual), Patient/Family education, Balance training, Stair training, Taping, Dry Needling, Joint mobilization, Spinal mobilization, Vestibular training, Cryotherapy, and Moist heat.  PLAN FOR NEXT SESSION: ASSESS LTGs-DISCHARGE!    Earlean Glaze, PT, DPT 06/26/2023, 3:30 PM

## 2023-07-03 ENCOUNTER — Encounter (HOSPITAL_COMMUNITY): Payer: Self-pay

## 2023-07-06 ENCOUNTER — Ambulatory Visit: Admitting: Physical Therapy

## 2023-07-10 ENCOUNTER — Ambulatory Visit: Admitting: Physician Assistant

## 2023-07-10 ENCOUNTER — Encounter: Payer: Self-pay | Admitting: Physician Assistant

## 2023-07-10 VITALS — BP 128/88 | HR 88 | Temp 98.0°F | Resp 18 | Ht 63.0 in | Wt 170.4 lb

## 2023-07-10 DIAGNOSIS — F32A Depression, unspecified: Secondary | ICD-10-CM

## 2023-07-10 DIAGNOSIS — F419 Anxiety disorder, unspecified: Secondary | ICD-10-CM | POA: Diagnosis not present

## 2023-07-10 NOTE — Assessment & Plan Note (Signed)
 For now, cont lexapro  20 mg, buspar  5 mg bid.  Pending genesight testing, will adjust meds.  Pt had declined psych appt for med management.  -ok for 1 week off to decide FMLA, contact HR - Discuss intermittent FMLA with HR for mental health days. Would provide 4 days/month, 1-12 hours.  - Provide fax number for FMLA paperwork submission.

## 2023-07-10 NOTE — Progress Notes (Signed)
 Established patient visit   Patient: Janet Duran   DOB: May 03, 1995   27 y.o. Female  MRN: [id number] Visit Date: 07/10/2023  Today's healthcare provider: Trenton Frock, PA-C   Cc. Anxiety and depression follow up  Subjective     Anxiety, Depression Follow-up  Pt was last seen for anxiety 6 weeks ago, advised to continue her lexapro  20 mg, adding buspar  5 mg bid.  She was referred to psychiatry. She has an upcoming appointment 07/12/23-- this is just for genesight genetic testing.  She feels her anxiety is severe, unchanged.  Discussed the use of AI scribe software for clinical note transcription with the patient, who gave verbal consent to proceed.  History of Present Illness   Janet Duran is a 28 year old female who presents for a follow-up regarding her mental health and medication management.  She is taking Buspar  5 mg twice daily without any noticeable improvement in her symptoms. She is contemplating taking a week off work due to concerns about her mental health affecting her job performance. Would like to discuss FMLA      GAD-7 Results    07/10/2023   10:50 AM 06/21/2023    1:16 PM 05/29/2023    9:43 AM  GAD-7 Generalized Anxiety Disorder Screening Tool  1. Feeling Nervous, Anxious, or on Edge 3  3  2. Not Being Able to Stop or Control Worrying 3  3  3. Worrying Too Much About Different Things 3  3  4. Trouble Relaxing 3  3  5. Being So Restless it's Hard To Sit Still 2  1  6. Becoming Easily Annoyed or Irritable 3  3  7. Feeling Afraid As If Something Awful Might Happen 2  1  Total GAD-7 Score 19  17  Difficulty At Work, Home, or Getting  Along With Others? Extremely difficult  Very difficult     Information is confidential and restricted. Go to Review Flowsheets to unlock data.    PHQ-9 Scores    07/10/2023   10:49 AM 06/21/2023    1:11 PM 05/29/2023    9:41 AM  PHQ9 SCORE ONLY  PHQ-9 Total Score 22  17     Information is  confidential and restricted. Go to Review Flowsheets to unlock data.    ---------------------------------------------------------------------------------------------------   Medications: Outpatient Medications Prior to Visit  Medication Sig   busPIRone  (BUSPAR ) 5 MG tablet Take 1 tablet (5 mg total) by mouth 2 (two) times daily.   escitalopram  (LEXAPRO ) 20 MG tablet Take 1 tablet (20 mg total) by mouth daily.   natalizumab (TYSABRI) 300 MG/15ML injection Inject into the vein.   norethindrone  (MICRONOR ) 0.35 MG tablet Take 1 tablet (0.35 mg total) by mouth daily.   phentermine  30 MG capsule TAKE 1 CAPSULE BY MOUTH EVERY DAY IN THE MORNING   solifenacin  (VESICARE ) 10 MG tablet TAKE 1 TABLET BY MOUTH EVERY DAY   No facility-administered medications prior to visit.    Review of Systems  Constitutional:  Negative for fatigue and fever.  Respiratory:  Negative for cough and shortness of breath.   Cardiovascular:  Negative for chest pain and leg swelling.  Gastrointestinal:  Negative for abdominal pain.  Neurological:  Negative for dizziness and headaches.  Psychiatric/Behavioral:  Positive for sleep disturbance. The patient is nervous/anxious.        Objective    BP 128/88 (BP Location: Left Arm, Patient Position: Sitting, Cuff Size: Normal)   Pulse 88   Temp  98 F (36.7 C) (Oral)   Resp 18   Ht 5\' 3"  (1.6 m)   Wt 170 lb 6.4 oz (77.3 kg)   SpO2 100%   BMI 30.19 kg/m    Physical Exam Vitals reviewed.  Constitutional:      Appearance: She is not ill-appearing.  HENT:     Head: Normocephalic.  Eyes:     Conjunctiva/sclera: Conjunctivae normal.  Cardiovascular:     Rate and Rhythm: Normal rate.  Pulmonary:     Effort: Pulmonary effort is normal. No respiratory distress.  Neurological:     Mental Status: She is alert and oriented to person, place, and time.  Psychiatric:        Mood and Affect: Mood is anxious. Affect is tearful.        Behavior: Behavior normal.       No results found for any visits on 07/10/23.  Assessment & Plan    Anxiety and depression Assessment & Plan: For now, cont lexapro  20 mg, buspar  5 mg bid.  Pending genesight testing, will adjust meds.  Pt had declined psych appt for med management.  -ok for 1 week off to decide FMLA, contact HR - Discuss intermittent FMLA with HR for mental health days. Would provide 4 days/month, 1-12 hours.  - Provide fax number for FMLA paperwork submission.      Return f/u genesight testing.       Trenton Frock, PA-C  [facility] at [facility] [phone] (phone) [phone] (fax)  [facility]

## 2023-07-11 ENCOUNTER — Telehealth (HOSPITAL_COMMUNITY): Payer: Self-pay | Admitting: Registered Nurse

## 2023-07-11 NOTE — Telephone Encounter (Signed)
 Left voicemail for patient regarding need to reschedule tomorrow's appointment for swab for GeneSight testing due to unavailability of clinical staff.

## 2023-07-12 ENCOUNTER — Ambulatory Visit (HOSPITAL_COMMUNITY): Admitting: Registered Nurse

## 2023-07-13 ENCOUNTER — Ambulatory Visit (HOSPITAL_COMMUNITY)

## 2023-07-13 ENCOUNTER — Encounter: Payer: Self-pay | Admitting: Physician Assistant

## 2023-07-14 ENCOUNTER — Ambulatory Visit: Admitting: Physical Therapy

## 2023-07-15 ENCOUNTER — Other Ambulatory Visit: Payer: Self-pay | Admitting: Physician Assistant

## 2023-07-15 DIAGNOSIS — F32A Depression, unspecified: Secondary | ICD-10-CM

## 2023-07-18 ENCOUNTER — Other Ambulatory Visit: Payer: Self-pay | Admitting: Physician Assistant

## 2023-07-18 DIAGNOSIS — F419 Anxiety disorder, unspecified: Secondary | ICD-10-CM

## 2023-08-01 ENCOUNTER — Ambulatory Visit: Admitting: Physician Assistant

## 2023-08-14 ENCOUNTER — Telehealth (HOSPITAL_COMMUNITY): Payer: Self-pay | Admitting: *Deleted

## 2023-08-14 NOTE — Telephone Encounter (Signed)
 Patient had GeneSight Testing Done. Both Checks Swabbed. Results Returned & Given to Provider Shuvon Rankin.NP

## 2023-08-30 ENCOUNTER — Telehealth: Payer: Self-pay | Admitting: *Deleted

## 2023-08-30 ENCOUNTER — Other Ambulatory Visit: Payer: Self-pay | Admitting: *Deleted

## 2023-08-30 ENCOUNTER — Other Ambulatory Visit: Payer: Self-pay

## 2023-08-30 DIAGNOSIS — G35 Multiple sclerosis: Secondary | ICD-10-CM

## 2023-08-30 DIAGNOSIS — Z79899 Other long term (current) drug therapy: Secondary | ICD-10-CM

## 2023-08-30 NOTE — Telephone Encounter (Signed)
 Placed JCV lab in [facility] lock box for routine lab pick up. Results pending.

## 2023-08-31 ENCOUNTER — Encounter: Payer: Self-pay | Admitting: Physician Assistant

## 2023-08-31 ENCOUNTER — Ambulatory Visit: Admitting: Physician Assistant

## 2023-08-31 VITALS — BP 128/84 | HR 75 | Ht 63.0 in | Wt 170.4 lb

## 2023-08-31 DIAGNOSIS — F419 Anxiety disorder, unspecified: Secondary | ICD-10-CM

## 2023-08-31 DIAGNOSIS — F32A Depression, unspecified: Secondary | ICD-10-CM | POA: Diagnosis not present

## 2023-08-31 LAB — CBC WITH DIFFERENTIAL/PLATELET
Basophils Absolute: 0.1 x10E3/uL (ref 0.0–0.2)
Basos: 1 %
EOS (ABSOLUTE): 0.8 x10E3/uL — ABNORMAL HIGH (ref 0.0–0.4)
Eos: 6 %
Hematocrit: 45.8 % (ref 34.0–46.6)
Hemoglobin: 14.8 g/dL (ref 11.1–15.9)
Immature Grans (Abs): 0 x10E3/uL (ref 0.0–0.1)
Immature Granulocytes: 0 %
Lymphocytes Absolute: 5.2 x10E3/uL — ABNORMAL HIGH (ref 0.7–3.1)
Lymphs: 40 %
MCH: 29.4 pg (ref 26.6–33.0)
MCHC: 32.3 g/dL (ref 31.5–35.7)
MCV: 91 fL (ref 79–97)
Monocytes Absolute: 1.2 x10E3/uL — ABNORMAL HIGH (ref 0.1–0.9)
Monocytes: 9 %
Neutrophils Absolute: 5.7 x10E3/uL (ref 1.4–7.0)
Neutrophils: 44 %
Platelets: 426 x10E3/uL (ref 150–450)
RBC: 5.03 x10E6/uL (ref 3.77–5.28)
RDW: 13.5 % (ref 11.7–15.4)
WBC: 13.1 x10E3/uL — ABNORMAL HIGH (ref 3.4–10.8)

## 2023-08-31 MED ORDER — BUPROPION HCL ER (XL) 150 MG PO TB24: 150.0000 mg | ORAL_TABLET | Freq: Every day | ORAL | 1 refills | Status: DC

## 2023-08-31 MED ORDER — ESCITALOPRAM OXALATE 10 MG PO TABS: 10.0000 mg | ORAL_TABLET | Freq: Every day | ORAL | 2 refills | Status: DC

## 2023-08-31 NOTE — Progress Notes (Signed)
 Established patient visit   Patient: Janet Duran   DOB: [birth year]   28 y.o. Female  MRN: [id number] Visit Date: 08/31/2023  Today's healthcare provider: Manuelita Flatness, PA-C   Cc. Depression/anxiety testing  Subjective     Depression, anxiety, Follow-up  Manages with lexapro , buspar  currently. Just completed GeneSight testing, and lexapro  has a moderate-drug interaction. Buspar  is good.      08/31/2023    1:25 PM 07/10/2023   10:49 AM 06/21/2023    1:11 PM  Depression screen PHQ 2/9  Decreased Interest 1 3 3   Down, Depressed, Hopeless 1 3 3   PHQ - 2 Score 2 6 6   Altered sleeping 2 3 3   Tired, decreased energy 3 3 3   Change in appetite 1 3 1   Feeling bad or failure about yourself  1 3 1   Trouble concentrating 1 2 1   Moving slowly or fidgety/restless 1 2 0  Suicidal thoughts 0 0 0  PHQ-9 Score 11 22 15   Difficult doing work/chores Somewhat difficult Extremely dIfficult Extremely dIfficult    -----------------------------------------------------------------------------------------  Medications: Outpatient Medications Prior to Visit  Medication Sig   busPIRone  (BUSPAR ) 5 MG tablet Take 1 tablet (5 mg total) by mouth 2 (two) times daily.   natalizumab (TYSABRI) 300 MG/15ML injection Inject into the vein.   norethindrone  (MICRONOR ) 0.35 MG tablet Take 1 tablet (0.35 mg total) by mouth daily.   phentermine  30 MG capsule TAKE 1 CAPSULE BY MOUTH EVERY DAY IN THE MORNING   solifenacin  (VESICARE ) 10 MG tablet TAKE 1 TABLET BY MOUTH EVERY DAY   [DISCONTINUED] escitalopram  (LEXAPRO ) 20 MG tablet Take 1 tablet (20 mg total) by mouth daily.   No facility-administered medications prior to visit.    Review of Systems  Constitutional:  Negative for fatigue and fever.  Respiratory:  Negative for cough and shortness of breath.   Cardiovascular:  Negative for chest pain and leg swelling.  Gastrointestinal:  Negative for abdominal pain.  Neurological:  Negative for  dizziness and headaches.       Objective    BP 128/84   Pulse 75   Ht 5' 3 (1.6 m)   Wt 170 lb 6.4 oz (77.3 kg)   BMI 30.19 kg/m    Physical Exam Vitals reviewed.  Constitutional:      Appearance: She is not ill-appearing.  HENT:     Head: Normocephalic.  Eyes:     Conjunctiva/sclera: Conjunctivae normal.  Cardiovascular:     Rate and Rhythm: Normal rate.  Pulmonary:     Effort: Pulmonary effort is normal. No respiratory distress.  Neurological:     Mental Status: She is alert and oriented to person, place, and time.  Psychiatric:        Mood and Affect: Mood normal.        Behavior: Behavior normal.     No results found for any visits on 08/31/23.  Assessment & Plan    Anxiety and depression Assessment & Plan: Recommend titrating off of lexapro -- rx 10 mg tablets. Advised titrating schedule.   Starting wellbutrin  150 mg xr, keeping buspar .  Advised no etoh w/ wellbutrin , pt denies seizure history.  If no improvement in 3-4 weeks would consider dose increase of wellbutrin .   Buspar  only 5 mg bid-- if depression symptoms improved, but still having anxiety symptoms, would consider titrating up dose. Pt feels cleared to return to work Monday. Paperwork signed.  F/b 4 weeks or earlier if needed  Orders: -  buPROPion  HCl ER (XL); Take 1 tablet (150 mg total) by mouth daily.  Dispense: 90 tablet; Refill: 1 -     Escitalopram  Oxalate; Take 1 tablet (10 mg total) by mouth daily.  Dispense: 30 tablet; Refill: 2     Return in about 4 weeks (around 09/28/2023) for anxiety, depression.       Manuelita Flatness, PA-C  [facility] Primary Care at [facility] [phone] (phone) [phone] (fax)  [facility]

## 2023-08-31 NOTE — Assessment & Plan Note (Signed)
 Recommend titrating off of lexapro -- rx 10 mg tablets. Advised titrating schedule.   Starting wellbutrin  150 mg xr, keeping buspar .  Advised no etoh w/ wellbutrin , pt denies seizure history.  If no improvement in 3-4 weeks would consider dose increase of wellbutrin .   Buspar  only 5 mg bid-- if depression symptoms improved, but still having anxiety symptoms, would consider titrating up dose. Pt feels cleared to return to work Monday. Paperwork signed.  F/b 4 weeks or earlier if needed

## 2023-09-22 ENCOUNTER — Encounter: Payer: Self-pay | Admitting: Neurology

## 2023-09-26 ENCOUNTER — Other Ambulatory Visit: Payer: Self-pay | Admitting: Physician Assistant

## 2023-09-26 DIAGNOSIS — F419 Anxiety disorder, unspecified: Secondary | ICD-10-CM

## 2023-10-02 ENCOUNTER — Ambulatory Visit: Admitting: Physician Assistant

## 2023-10-05 ENCOUNTER — Encounter: Payer: Self-pay | Admitting: Physician Assistant

## 2023-10-05 ENCOUNTER — Ambulatory Visit: Admitting: Physician Assistant

## 2023-10-05 VITALS — BP 132/83 | HR 92 | Ht 63.0 in | Wt 168.8 lb

## 2023-10-05 DIAGNOSIS — F419 Anxiety disorder, unspecified: Secondary | ICD-10-CM | POA: Diagnosis not present

## 2023-10-05 DIAGNOSIS — F32A Depression, unspecified: Secondary | ICD-10-CM | POA: Diagnosis not present

## 2023-10-05 MED ORDER — BUPROPION HCL ER (XL) 300 MG PO TB24: 300.0000 mg | ORAL_TABLET | Freq: Every day | ORAL | 3 refills | Status: DC

## 2023-10-05 NOTE — Assessment & Plan Note (Signed)
 Pt has titrated off lexapro, on wellbutrin 150 mg and bupar 5 mg bid.  No improvement in symptoms. Recommending increasing wellbutrin to 300 mg and referring to psych for med management.  She had gene sight testing completed and we are limited with drug management from a primary care perspective. Recommending f/u here in 4 weeks w/ a new provider if she cannot see psychiatry before then

## 2023-10-05 NOTE — Progress Notes (Signed)
 Established patient visit   Patient: Janet Duran   DOB: 1995/12/07   28 y.o. Female  MRN: [id number] Visit Date: 10/05/2023  Today's healthcare provider: Manuelita Flatness, PA-C   Cc. Anxiety/depression fu  Subjective      Depression, Follow-up  Pt denies any improvement in symptoms.     10/05/2023    2:49 PM 08/31/2023    1:25 PM 07/10/2023   10:49 AM  Depression screen PHQ 2/9  Decreased Interest 2 1 3   Down, Depressed, Hopeless 2 1 3   PHQ - 2 Score 4 2 6   Altered sleeping 2 2 3   Tired, decreased energy 3 3 3   Change in appetite 2 1 3   Feeling bad or failure about yourself  1 1 3   Trouble concentrating 2 1 2   Moving slowly or fidgety/restless 1 1 2   Suicidal thoughts 0 0 0  PHQ-9 Score 15 11 22   Difficult doing work/chores Very difficult Somewhat difficult Extremely dIfficult    -----------------------------------------------------------------------------------------   Medications: Outpatient Medications Prior to Visit  Medication Sig   busPIRone (BUSPAR) 5 MG tablet TAKE 1 TABLET BY MOUTH TWICE A DAY   escitalopram (LEXAPRO) 10 MG tablet TAKE 1 TABLET BY MOUTH EVERY DAY   natalizumab (TYSABRI) 300 MG/15ML injection Inject into the vein.   norethindrone (MICRONOR) 0.35 MG tablet Take 1 tablet (0.35 mg total) by mouth daily.   phentermine 30 MG capsule TAKE 1 CAPSULE BY MOUTH EVERY DAY IN THE MORNING   solifenacin (VESICARE) 10 MG tablet TAKE 1 TABLET BY MOUTH EVERY DAY   [DISCONTINUED] buPROPion (WELLBUTRIN XL) 150 MG 24 hr tablet Take 1 tablet (150 mg total) by mouth daily.   No facility-administered medications prior to visit.    Review of Systems  Constitutional:  Negative for fatigue and fever.  Respiratory:  Negative for cough and shortness of breath.   Cardiovascular:  Negative for chest pain and leg swelling.  Gastrointestinal:  Negative for abdominal pain.  Neurological:  Negative for dizziness and headaches.       Objective    BP  132/83   Pulse 92   Ht 5' 3 (1.6 m)   Wt 168 lb 12.8 oz (76.6 kg)   LMP 09/23/2023 (Exact Date)   BMI 29.90 kg/m    Physical Exam Vitals reviewed.  Constitutional:      Appearance: She is not ill-appearing.  HENT:     Head: Normocephalic.  Eyes:     Conjunctiva/sclera: Conjunctivae normal.  Cardiovascular:     Rate and Rhythm: Normal rate.  Pulmonary:     Effort: Pulmonary effort is normal. No respiratory distress.  Neurological:     Mental Status: She is alert and oriented to person, place, and time.  Psychiatric:        Mood and Affect: Mood normal.        Behavior: Behavior normal.      No results found for any visits on 10/05/23.  Assessment & Plan    Anxiety and depression Assessment & Plan: Pt has titrated off lexapro, on wellbutrin 150 mg and bupar 5 mg bid.  No improvement in symptoms. Recommending increasing wellbutrin to 300 mg and referring to psych for med management.  She had gene sight testing completed and we are limited with drug management from a primary care perspective. Recommending f/u here in 4 weeks w/ a new provider if she cannot see psychiatry before then  Orders: -     buPROPion HCl ER (  XL); Take 1 tablet (300 mg total) by mouth daily.  Dispense: 30 tablet; Refill: 3 -     Ambulatory referral to Psychiatry     Return in about 4 weeks (around 11/02/2023) for depression w/ taylor beck.       Manuelita Flatness, PA-C  [facility] Primary [facility] at [facility] [phone] (phone) [phone] (fax)  [facility]

## 2023-11-09 ENCOUNTER — Ambulatory Visit: Admitting: Family Medicine

## 2023-11-09 ENCOUNTER — Encounter: Payer: Self-pay | Admitting: Family Medicine

## 2023-11-09 VITALS — BP 122/89 | HR 98 | Ht 63.0 in | Wt 172.0 lb

## 2023-11-09 DIAGNOSIS — Z23 Encounter for immunization: Secondary | ICD-10-CM | POA: Diagnosis not present

## 2023-11-09 DIAGNOSIS — F419 Anxiety disorder, unspecified: Secondary | ICD-10-CM | POA: Diagnosis not present

## 2023-11-09 DIAGNOSIS — G35 Multiple sclerosis: Secondary | ICD-10-CM

## 2023-11-09 DIAGNOSIS — Z0000 Encounter for general adult medical examination without abnormal findings: Secondary | ICD-10-CM

## 2023-11-09 DIAGNOSIS — E781 Pure hyperglyceridemia: Secondary | ICD-10-CM

## 2023-11-09 DIAGNOSIS — E538 Deficiency of other specified B group vitamins: Secondary | ICD-10-CM

## 2023-11-09 DIAGNOSIS — E559 Vitamin D deficiency, unspecified: Secondary | ICD-10-CM

## 2023-11-09 DIAGNOSIS — F32A Depression, unspecified: Secondary | ICD-10-CM | POA: Diagnosis not present

## 2023-11-09 LAB — B12 AND FOLATE PANEL
Folate: 8.1 ng/mL (ref >5.9)
Vitamin B-12: 177 pg/mL — ABNORMAL LOW (ref 211–911)

## 2023-11-09 LAB — COMPREHENSIVE METABOLIC PANEL WITH GFR
ALT: 24 U/L (ref 0–35)
AST: 15 U/L (ref 0–37)
Albumin: 4.6 g/dL (ref 3.5–5.2)
Alkaline Phosphatase: 58 U/L (ref 39–117)
BUN: 8 mg/dL (ref 6–23)
CO2: 27 meq/L (ref 19–32)
Calcium: 9.6 mg/dL (ref 8.4–10.5)
Chloride: 103 meq/L (ref 96–112)
Creatinine, Ser: 0.61 mg/dL (ref 0.40–1.20)
GFR: 121.77 mL/min (ref >60.00)
Glucose, Bld: 100 mg/dL — ABNORMAL HIGH (ref 70–99)
Potassium: 4.6 meq/L (ref 3.5–5.1)
Sodium: 136 meq/L (ref 135–145)
Total Bilirubin: 0.5 mg/dL (ref 0.2–1.2)
Total Protein: 6.8 g/dL (ref 6.0–8.3)

## 2023-11-09 LAB — LIPID PANEL
Cholesterol: 199 mg/dL (ref 0–200)
HDL: 43 mg/dL (ref >39.00)
LDL Cholesterol: 138 mg/dL — ABNORMAL HIGH (ref 0–99)
NonHDL: 156.47
Total CHOL/HDL Ratio: 5
Triglycerides: 93 mg/dL (ref 0.0–149.0)
VLDL: 18.6 mg/dL (ref 0.0–40.0)

## 2023-11-09 LAB — VITAMIN D 25 HYDROXY (VIT D DEFICIENCY, FRACTURES): VITD: 13.44 ng/mL — ABNORMAL LOW (ref 30.00–100.00)

## 2023-11-09 LAB — TSH: TSH: 0.75 u[IU]/mL (ref 0.35–5.50)

## 2023-11-09 NOTE — Assessment & Plan Note (Signed)
 Symptoms well-managed with Tysabri, VESIcare , and phentermine . No worsening reported. - Continue Tysabri injections - potentially triggering her leukocytosis - Continue Vesicare  for bladder urgency - Continue phentermine  for fatigue - Follow up with neurology every six months. Due to see them in a few weeks.

## 2023-11-09 NOTE — Progress Notes (Signed)
 Established Patient Office Visit  Subjective   Patient ID: Janet Duran, female    DOB: 01/23/1996  Age: 28 y.o. MRN: [id number]  Chief Complaint  Patient presents with   Establish Care    HPI   Discussed the use of AI scribe software for clinical note transcription with the patient, who gave verbal consent to proceed.  History of Present Illness Janet Duran is a 28 year old female with anxiety, depression, and multiple sclerosis who presents for medication management and transition of care.  She has a history of anxiety and depression and is currently taking Wellbutrin  300 mg and Buspar  5 mg twice a day. She reports that, according to the genetic testing, Wellbutrin  was one of the only options that might work, but she has not noticed improvement and possibly feels a little worse. No thoughts of self-harm. She is scheduled to see a psychiatrist in one to two weeks for further management. No desired changes today.  She was diagnosed with multiple sclerosis (MS) in 2021 after experiencing significant symptoms such as complete blurring of vision and numbness in one hand for about twenty minutes. She continues to experience symptoms that come and go, including fatigue and tiredness. She is on phentermine , which initially helped with fatigue but is now less effective. She receives monthly Tysabri injections, which she has been on since her diagnosis. She also takes VESIcare  for bladder issues related to MS, which is working well.  She has a history of high cholesterol, which was noted in her last physical labs in November of the previous year. Her blood cell count is consistently slightly high. She is not currently taking vitamin D  or B12 supplements, although both have been low in the past.  Socially, she works as a Scientist, clinical (histocompatibility and immunogenetics) for [employer] and lives alone. She is married, but her husband lives in [place], and she is working on his visa. She sees her husband  approximately twice a year. She has been married for a year and traveled to [place] for the wedding.         11/09/2023   10:19 AM 10/05/2023    2:49 PM 08/31/2023    1:25 PM  PHQ9 SCORE ONLY  PHQ-9 Total Score 15 15  11       Data saved with a previous flowsheet row definition      11/09/2023   10:19 AM 10/05/2023    2:49 PM 08/31/2023    1:25 PM 07/10/2023   10:50 AM  GAD 7 : Generalized Anxiety Score  Nervous, Anxious, on Edge 3 3 1 3   Control/stop worrying 2 2 1 3   Worry too much - different things 2 3 1 3   Trouble relaxing 2 2 1 3   Restless 1 1 1 2   Easily annoyed or irritable 3 3 2 3   Afraid - awful might happen 1 1 1 2   Total GAD 7 Score 14 15 8 19   Anxiety Difficulty Very difficult Very difficult Somewhat difficult Extremely difficult         ROS All review of systems negative except what is listed in the HPI    Objective:     BP 122/89   Pulse 98   Ht 5' 3 (1.6 m)   Wt 172 lb (78 kg)   SpO2 100%   BMI 30.47 kg/m    Physical Exam Vitals reviewed.  Constitutional:      General: She is not in acute distress.    Appearance: Normal  appearance. She is obese. She is not ill-appearing.  Cardiovascular:     Rate and Rhythm: Normal rate and regular rhythm.     Heart sounds: Normal heart sounds.  Pulmonary:     Effort: Pulmonary effort is normal.     Breath sounds: Normal breath sounds.  Skin:    General: Skin is warm and dry.  Neurological:     Mental Status: She is alert and oriented to person, place, and time.  Psychiatric:        Mood and Affect: Mood normal.        Behavior: Behavior normal.        Thought Content: Thought content normal.        Judgment: Judgment normal.          No results found for any visits on 11/09/23.    The ASCVD Risk score (Arnett DK, et al., 2019) failed to calculate for the following reasons:   The 2019 ASCVD risk score is only valid for ages 46 to 67    Assessment & Plan:   Problem List Items  Addressed This Visit       Active Problems   Multiple sclerosis (HCC)   Symptoms well-managed with Tysabri, VESIcare , and phentermine . No worsening reported. - Continue Tysabri injections - potentially triggering her leukocytosis - Continue Vesicare  for bladder urgency - Continue phentermine  for fatigue - Follow up with neurology every six months. Due to see them in a few weeks.      Anxiety and depression - Primary   On Wellbutrin  and Buspar . No improvement noted. Referral to psychiatry previously made. No suicidal ideation. - Continue current medication regimen until psychiatry appointment. - Follow up with psychiatry in one to two weeks.      Relevant Orders   Comprehensive metabolic panel with GFR   TSH   B12 deficiency   No current supplement. Labs today.       Relevant Orders   B12 and Folate Panel   Vitamin D  deficiency   No current supplement. Labs today.      Relevant Orders   VITAMIN D  25 Hydroxy (Vit-D Deficiency, Fractures)   Hypertriglyceridemia   Previously identified high cholesterol. Last checked November last year. - Order fasting cholesterol test.      Relevant Orders   Lipid panel   Comprehensive metabolic panel with GFR   Other Visit Diagnoses       Encounter for medical examination to establish care         Need for Tdap vaccination       Relevant Orders   Tdap vaccine greater than or equal to 7yo IM (Completed)             Return in about 1 year (around 11/08/2024), or if symptoms worsen or fail to improve, for physical.    Waddell KATHEE Mon, NP

## 2023-11-09 NOTE — Assessment & Plan Note (Signed)
 Previously identified high cholesterol. Last checked November last year. - Order fasting cholesterol test.

## 2023-11-09 NOTE — Assessment & Plan Note (Signed)
No current supplement  Labs today

## 2023-11-09 NOTE — Assessment & Plan Note (Signed)
 On Wellbutrin  and Buspar . No improvement noted. Referral to psychiatry previously made. No suicidal ideation. - Continue current medication regimen until psychiatry appointment. - Follow up with psychiatry in one to two weeks.

## 2023-11-12 ENCOUNTER — Ambulatory Visit: Payer: Self-pay | Admitting: Family Medicine

## 2023-11-12 DIAGNOSIS — E559 Vitamin D deficiency, unspecified: Secondary | ICD-10-CM

## 2023-11-12 MED ORDER — VITAMIN D (ERGOCALCIFEROL) 1.25 MG (50000 UNIT) PO CAPS: 50000.0000 [IU] | ORAL_CAPSULE | ORAL | 0 refills | Status: DC

## 2023-11-20 ENCOUNTER — Other Ambulatory Visit: Payer: Self-pay

## 2023-11-20 DIAGNOSIS — F419 Anxiety disorder, unspecified: Secondary | ICD-10-CM

## 2023-11-20 MED ORDER — BUPROPION HCL ER (XL) 300 MG PO TB24
300.0000 mg | ORAL_TABLET | Freq: Every day | ORAL | 0 refills | Status: DC
Start: 2023-11-20 — End: 2023-12-06

## 2023-11-22 ENCOUNTER — Other Ambulatory Visit: Payer: Self-pay | Admitting: *Deleted

## 2023-11-22 DIAGNOSIS — G35D Multiple sclerosis, unspecified: Secondary | ICD-10-CM

## 2023-11-22 DIAGNOSIS — Z79899 Other long term (current) drug therapy: Secondary | ICD-10-CM

## 2023-11-23 ENCOUNTER — Telehealth: Payer: Self-pay | Admitting: *Deleted

## 2023-11-23 ENCOUNTER — Other Ambulatory Visit

## 2023-11-23 NOTE — Telephone Encounter (Signed)
 Placed JCV lab in [facility] lock box for routine lab pick up. Results pending.

## 2023-11-28 ENCOUNTER — Telehealth (HOSPITAL_COMMUNITY): Admitting: Registered Nurse

## 2023-11-28 NOTE — Telephone Encounter (Signed)
 Received fax below from [facility]. I called them at [phone] as it is unclear what they need. Spoke w/ Vicky. Account [id number].  She reviewed pt chart. Needed to verify source of specimen. Verified specimen: plasma. No need for us  to return form since we called and provided clarification she said. Nothing further needed.

## 2023-11-30 ENCOUNTER — Ambulatory Visit: Admitting: Neurology

## 2023-11-30 ENCOUNTER — Encounter: Payer: Self-pay | Admitting: Neurology

## 2023-11-30 VITALS — BP 124/89 | HR 98 | Ht 62.0 in | Wt 169.0 lb

## 2023-11-30 DIAGNOSIS — G35A Relapsing-remitting multiple sclerosis: Secondary | ICD-10-CM

## 2023-11-30 DIAGNOSIS — Z79899 Other long term (current) drug therapy: Secondary | ICD-10-CM | POA: Diagnosis not present

## 2023-11-30 DIAGNOSIS — E559 Vitamin D deficiency, unspecified: Secondary | ICD-10-CM | POA: Diagnosis not present

## 2023-11-30 DIAGNOSIS — R5383 Other fatigue: Secondary | ICD-10-CM

## 2023-11-30 DIAGNOSIS — D72820 Lymphocytosis (symptomatic): Secondary | ICD-10-CM

## 2023-11-30 DIAGNOSIS — G43109 Migraine with aura, not intractable, without status migrainosus: Secondary | ICD-10-CM

## 2023-11-30 DIAGNOSIS — R519 Headache, unspecified: Secondary | ICD-10-CM

## 2023-11-30 MED ORDER — PHENTERMINE HCL 37.5 MG PO CAPS: 37.5000 mg | ORAL_CAPSULE | ORAL | 5 refills | Status: AC

## 2023-11-30 NOTE — Progress Notes (Signed)
 [facility]  PATIENT: Janet Duran DOB: Jul 20, 1995  REFERRING DOCTOR OR PCP: Arthea Farrow, MD SOURCE: Patient, notes from Dr. Farrow, imaging and lab reports, MRI images personally reviewed.  _________________________________   HISTORICAL  CHIEF COMPLAINT:  Chief Complaint  Patient presents with   Follow-up    Pt in room 10. Alone. Here for MS follow up.    HISTORY OF PRESENT ILLNESS:  Janet Duran is a 28 y.o. woman with relapsing remitting multiple sclerosis.  Update 11/30/2023: She is on Tysabri and tolerating well.SABRA Next infusion is 01/04/2024.  No exacerbations.  No new neurologic symptoms.   JCV Ab was negative in July 2025 (0.07).   She has had elevated lymphocyte since starting Tysabri and saw hematology.  Flow cytometry was normal and this is likely due to the Tysabri therapy..    Currently, she feels her MS is stable.  She reports her gait is doing fairly well with just mildly reduced balance.  She can go up and down stairs without the banister.  She keeps up with others on long walks.   She has some heat intolerance.   She has had some intermittent foot tingling.  No painful dysesthesias.  She denies weakness in her arms or legs but she notes mild muscle spasticity that has been stable.  She gets some cramps that wake her up..  She notes urinary urgency improved with solifenacin   Vision is doing well  She has fatigue, physical > cognitive     She sleeps well most nights..    She notes depression and anxiety, a little better this year.   She is on Lexapro     She notes mild brain fog and has had trouble coming up with the right word at times.    Phentermine  helps fatigue and mental fog initially, a little less now.   She did lose 10 pounds.    She works at [employer].     She is taking vitamin D  supplements 50000 weekly.    She no longer takes B12 - levels were good earlier this yea  She continues to experience migraine  headache about 10/month.  The intensity is usually mild to moderate.  She prefers not to be on a daily medication for prophylaxis.  She sometimes takes an NSAID but usually just deals with it.    Frequency is lower since BP better controlled.  In the past.  LFTs were elevated in 2022 but last several liver function tests were fine.   Hepatitis labs were fine.   She had an U/S and MRI and no pathology.  Her lymphocytes counts have been running high - likely due to natalizumab.        MS history: She was diagnosed with MS in March 2021 after presenting with fuzzy vision in both eyes and numbness in her right hand.   She had a migraine at the time.   Symptoms improved after 15 minutes.   She saw her PCP for suspected complicated migraine and was referred to Dr. Farrow.   She had a brain MRI which was consistent with MS.   She also had a cervical spine MRI.   To confirm the diagnosis, she underwent an LP.  It showed oligoclonal bands confirming the diagnosis of MS.    She was started on Tysabri.   She is on Tysabri 300 mg every 6 weeks and tolerates it well.    She has not had not had any exacerbations.  Her next infusion is scheduled for 01/24/2020.   Her last JCV antibody was 0.10 (negative)  08/07/2019.  IMAGING MRI of the cervical spine 07/20/2021 shows a T2 hyperintense focus to the left adjacent to C6.  This focus was acute on the 2021 MRI.  Another focus that had been mentioned at C4-C5 centrally is not seen on the current MRI.SABRA   Minimal degenerative changes at C4-C5.    MRI of the brain 08/02/2021 showed multiple T2/FLAIR hyperintense foci in the periventricular, juxtacortical and deep white matter of the cerebral hemispheres. Other foci are noted in the left lateral thalamus/internal capsule and in the right cerebellar hemisphere. Compared to the study dated 01/07/2020, there are no new lesions.   MRI of the brain 04/27/2019 showed multiple T2/FLAIR hyperintense foci in the hemispheres, many in the  periventricular white matter radially oriented to the ventricles.  The foci was also present in the lateral left thalamus, right pons and right cerebellum.  None of the foci enhanced.  MRI of the cervical spine showed abnormal T2 hyperintense foci to the left adjacent to C6 and centrally adjacent to C4-C5.  The C6 focus enhanced after contrast.  MRI 01/07/2020 showed T2/FLAIR hyperintense foci in the hemispheres, left thalamus and right cerebellar hemisphere in a pattern and configuration consistent with chronic demyelinating plaque associated with multiple sclerosis.  None of the foci appear to be acute.  They do not enhance.    There is a normal enhancement pattern.  LABS Lab work from 05/02/2019 was reviewed.  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  Family History: She is adopted.      REVIEW OF SYSTEMS: Constitutional: No fevers, chills, sweats, or change in appetite Eyes: No visual changes, double vision, eye pain Ear, nose and throat: No hearing loss, ear pain, nasal congestion, sore throat Cardiovascular: No chest pain, palpitations Respiratory:  No shortness of breath at rest or with exertion.   No wheezes GastrointestinaI: No nausea, vomiting, diarrhea, abdominal pain, fecal incontinence Genitourinary:  No dysuria, urinary retention or frequency.  No nocturia. Musculoskeletal:  No neck pain, back pain Integumentary: No rash, pruritus, skin lesions Neurological: as above Psychiatric: No depression at this time.  No anxiety Endocrine: No palpitations, diaphoresis, change in appetite, change in weigh or increased thirst Hematologic/Lymphatic:  No anemia,  purpura, petechiae. Allergic/Immunologic: No itchy/runny eyes, nasal congestion, recent allergic reactions, rashes  ALLERGIES: No Known Allergies  HOME MEDICATIONS:  Current Outpatient Medications:    buPROPion  (WELLBUTRIN  XL) 300 MG 24 hr tablet, Take 1 tablet (300 mg total) by mouth daily., Disp: 90 tablet, Rfl: 0   busPIRone  (BUSPAR ) 5 MG tablet, TAKE 1 TABLET BY MOUTH TWICE A DAY, Disp: 180 tablet, Rfl: 1   natalizumab (TYSABRI) 300 MG/15ML injection, Inject into the vein., Disp: , Rfl:    norethindrone  (MICRONOR ) 0.35 MG tablet, Take 1 tablet (0.35 mg total) by mouth daily., Disp: 72 tablet, Rfl: 3   solifenacin  (VESICARE ) 10 MG tablet, TAKE 1 TABLET BY MOUTH EVERY DAY, Disp: 30 tablet, Rfl: 4   Vitamin D , Ergocalciferol , (DRISDOL ) 1.25 MG (50000 UNIT) CAPS capsule, Take 1 capsule (50,000 Units total) by mouth every 7 (seven) days., Disp: 8 capsule, Rfl: 0   phentermine  37.5 MG  capsule, Take 1 capsule (37.5 mg total) by mouth every morning., Disp: 30 capsule, Rfl: 5  PAST MEDICAL HISTORY: Past Medical History:  Diagnosis Date   Anxiety    Depression    Ectopic beat    Ectopic heartbeat    Hypertension    Less than [redacted] weeks gestation of pregnancy 09/27/2021   Migraine    Multiple sclerosis    Urinary urgency 12/26/2019    PAST SURGICAL HISTORY: Past Surgical History:  Procedure Laterality Date   NO PAST SURGERIES      FAMILY HISTORY: Family History  Adopted: Yes  Family history unknown: Yes    SOCIAL HISTORY:  Social History   Socioeconomic History   Marital status: Single    Spouse name: Not on file   Number of children: 0   Years of education: Bachelors    Highest education level: Bachelor's degree (e.g., BA, AB, BS)  Occupational History   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   Tobacco Use   Smoking status: Never   Smokeless tobacco: Never  Vaping Use   Vaping status: Never Used  Substance and Sexual Activity   Alcohol use: Yes    Comment: rare   Drug  use: Never   Sexual activity: Yes    Birth control/protection: Pill  Other Topics Concern   Not on file  Social History Narrative   Right handed    Lives with boyfriend   Caffeine use: Soda daily ( 1 per day)   Social Drivers of Corporate investment banker Strain: Low Risk  (12/28/2022)   Overall Financial Resource Strain (CARDIA)    Difficulty of Paying Living Expenses: Not very hard  Food Insecurity: No Food Insecurity (12/28/2022)   Hunger Vital Sign    Worried About Running Out of Food in the Last Year: Never true    Ran Out of Food in the Last Year: Never true  Transportation Needs: No Transportation Needs (12/28/2022)   PRAPARE - Administrator, Civil Service (Medical): No    Lack of Transportation (Non-Medical): No  Physical Activity: Insufficiently Active (12/28/2022)   Exercise Vital Sign    Days of Exercise per Week: 2 days    Minutes of Exercise per Session: 30 min  Stress: Stress Concern Present (12/28/2022)   [employer] of Occupational Health - Occupational Stress Questionnaire    Feeling of Stress : Very much  Social Connections: Socially Isolated (12/28/2022)   Social Connection and Isolation Panel    Frequency of Communication with Friends and Family: Three times a week    Frequency of Social Gatherings with Friends and Family: Once a week    Attends Religious Services: Never    Database administrator or Organizations: No    Attends Engineer, structural: Not on file    Marital Status: Never married  Intimate Partner Violence: Not At Risk (03/25/2022)   Humiliation, Afraid, Rape, and Kick questionnaire    Fear of Current or Ex-Partner: No    Emotionally Abused: No    Physically Abused: No    Sexually Abused: No     PHYSICAL EXAM  Vitals:   11/30/23 1120  BP: 124/89  Pulse: 98  Weight: 169 lb (76.7 kg)  Height: 5' 2 (1.575 m)    Body mass index is 30.91 kg/m.  No results found.  General: The patient is well-developed and  well-nourished and in no acute distress  HEENT:  Head is [place]/AT.  Sclera are anicteric.  Neck:  The neck is nontender.   Skin: Extremities are without rash or  edema.    Neurologic Exam  Mental status: The patient is alert and oriented x 3 at the time of the examination. The patient has apparent normal recent and remote memory, with an apparently normal attention span and concentration ability.   Speech is normal.  Cranial nerves: Extraocular movements are full. Facial strength and sensation are normal.  No obvious hearing deficits are noted.  Motor:  Muscle bulk is normal.   Muscle tone was normal.  Strength was 5/5 in the arms and legs  Sensory: Sensory testing is intact to pinprick, soft touch and vibration sensation in all 4 extremities.  Coordination: Cerebellar testing reveals good finger-nose-finger and heel-to-shin bilaterally.  Gait and station: Station is normal.   Gait was normal.  Tandem gait is minimally wide.  The Romberg is negative.  Reflexes: Deep tendon reflexes are symmetric and normal bilaterally.       DIAGNOSTIC DATA (LABS, IMAGING, TESTING) - I reviewed patient records, labs, notes, testing and imaging myself where available.  Lab Results  Component Value Date   WBC 13.1 (H) 08/30/2023   HGB 14.8 08/30/2023   HCT 45.8 08/30/2023   MCV 91 08/30/2023   PLT 426 08/30/2023      Component Value Date/Time   NA 136 11/09/2023 1043   NA 138 06/10/2020 1210   K 4.6 11/09/2023 1043   CL 103 11/09/2023 1043   CO2 27 11/09/2023 1043   GLUCOSE 100 (H) 11/09/2023 1043   BUN 8 11/09/2023 1043   BUN 8 06/10/2020 1210   CREATININE 0.61 11/09/2023 1043   CREATININE 0.83 10/05/2022 0943   CALCIUM 9.6 11/09/2023 1043   PROT 6.8 11/09/2023 1043   PROT 7.5 12/23/2020 1327   ALBUMIN 4.6 11/09/2023 1043   ALBUMIN 5.1 (H) 12/23/2020 1327   AST 15 11/09/2023 1043   AST 14 (L) 10/05/2022 0943   ALT 24 11/09/2023 1043   ALT 22 10/05/2022 0943   ALKPHOS 58  11/09/2023 1043   BILITOT 0.5 11/09/2023 1043   BILITOT 0.6 10/05/2022 0943   GFRNONAA >60 10/05/2022 0943   GFRAA 147 02/01/2019 1543   Lab Results  Component Value Date   CHOL 199 11/09/2023   HDL 43.00 11/09/2023   LDLCALC 138 (H) 11/09/2023   LDLDIRECT 125.0 04/05/2022   TRIG 93.0 11/09/2023   CHOLHDL 5 11/09/2023    Lab Results  Component Value Date   TSH 0.75 11/09/2023       ASSESSMENT AND PLAN  Multiple sclerosis, relapsing-remitting  High risk medication use  Other fatigue  Vitamin D  deficiency  Migraine with aura and without status migrainosus, not intractable  Elevated lymphocyte count  Nonintractable episodic headache, unspecified headache type    1.  Continue Tysabri 300 mg every 4 weeks and check JCV q 6 months or so  2.  Continue phentermine  to help with weight loss and fatigue.  She feels the 30 mg dose helped her a little bit more initially that it does now and we discussed increasing to 37.5 mg. 3.   Stay active and exercise as tolerated. 4.  Continue Vesicare , Wellbutrin  and buspar .    5.    rtc 6 months  Fredonia Casalino A. Vear, MD, [facility] 11/30/2023, 11:54 AM Certified in Neurology, Clinical Neurophysiology, Sleep Medicine and Neuroimaging  [facility] [address], [address] [place], [place] [address] [phone]

## 2023-12-04 NOTE — Telephone Encounter (Signed)
 Janet Duran

## 2023-12-06 ENCOUNTER — Telehealth (HOSPITAL_COMMUNITY): Admitting: Registered Nurse

## 2023-12-06 ENCOUNTER — Encounter (HOSPITAL_COMMUNITY): Payer: Self-pay | Admitting: Registered Nurse

## 2023-12-06 DIAGNOSIS — F331 Major depressive disorder, recurrent, moderate: Secondary | ICD-10-CM | POA: Diagnosis not present

## 2023-12-06 DIAGNOSIS — F411 Generalized anxiety disorder: Secondary | ICD-10-CM | POA: Diagnosis not present

## 2023-12-06 MED ORDER — BUPROPION HCL 100 MG PO TABS: ORAL_TABLET | ORAL | 0 refills | Status: DC

## 2023-12-06 MED ORDER — DULOXETINE HCL 30 MG PO CPEP: 30.0000 mg | ORAL_CAPSULE | Freq: Every day | ORAL | 1 refills | Status: DC

## 2023-12-06 MED ORDER — BUSPIRONE HCL 7.5 MG PO TABS: 7.5000 mg | ORAL_TABLET | Freq: Two times a day (BID) | ORAL | 1 refills | Status: DC

## 2023-12-06 NOTE — Patient Instructions (Addendum)
 Wellbutrin  taper: 1.  Take 100 mg (1 tablet) twice daily for 5 days.  2.  Take 100 mg (1 tablet) daily for 5 days. 3.  Take 100 mg every other day for 5 days then stop/discontinue   With the taper of any medication there is always the possibility of Withdrawal symptoms:  Read information below on discontinuation syndrome.    Antidepressant Discontinuation Syndrome: Withdrawal Symptoms: Tapering off medication can lead to withdrawal symptoms, including brain zaps (electrical shock sensations in the brain). Brain Zaps: Common during antidepressant withdrawal, especially if stopping medication abruptly (cold [place]) rather than tapering off gradually. Managing Brain Zaps: Slower Taper: Gradually reduce medication dosage to minimize brain zaps. If symptoms occur, consider restarting the medication and tapering more slowly. Return to Medication: Some individuals may go back on their medication and then decide to taper off or switch to a different one. Providers may add adjunct medications. Prozac: Transitioning to Prozac, which has a longer half-life, can help reduce brain zaps during the tapering process. Vitamins and Supplements: Omega-3 fatty acids (fish oil) and Vitamin B12 have been reported to help reduce the severity and frequency of brain zaps, though their effectiveness is not scientifically verified. Understanding these strategies can help manage withdrawal symptoms more effectively.  Call 911, 988, mobile crisis, or present to the nearest emergency room should you experience any suicidal/homicidal ideation, auditory/visual/hallucinations, or detrimental worsening of your mental health.  Mobile Crisis Response Teams Listed by counties in vicinity of [facility] providers [facility] [phone] [facility] [phone] [facility] [phone] [facility] [phone] [place]                * [facility] [phone]                * [facility] [phone]  [facility] [phone] [facility] [employer] [facility]  [phone] * [facility] [phone]

## 2023-12-06 NOTE — Progress Notes (Signed)
 BH MD/PA/NP OP Progress Note  12/06/2023 10:39 AM Robbyn RETAJ HILBUN  MRN:  [id number]   Virtual Visit via Video Note  I connected with Janet Duran on 12/06/23 at  8:00 AM EDT by a video enabled telemedicine application and verified that I am speaking with the correct person using two identifiers.  Location: Patient: Home Provider: Home office   I discussed the limitations of evaluation and management by telemedicine and the availability of in person appointments. The patient expressed understanding and agreed to proceed.  I discussed the assessment and treatment plan with the patient. The patient was provided an opportunity to ask questions and all were answered. The patient agreed with the plan and demonstrated an understanding of the instructions.   The patient was advised to call back or seek an in-person evaluation if the symptoms worsen or if the condition fails to improve as anticipated.  I provided 40 minutes of non-face-to-face time during this encounter.   Janet Ruder, NP   Chief Complaint:  Chief Complaint  Patient presents with   Follow-up    Medication management   HPI: Janet Duran 28 y.o. female presents today for medication management follow up and to reestablish care.  She was seen via virtual video visit by this provide and chart reviewed on 12/06/23  Her psychiatric history is significant for major depression, general anxiety, PTSD.  Her mental health is currently managed with BuSpar  5 mg twice daily, Wellbutrin  XL 300 mg daily.  Patient's medication management was done by her PCP.  She reports mother the medications have been working and a Engineer, materials was done.  Referred back to psychiatry for medication management.  She states she does not feel that any of the current medications are effectively managing her mental health effectively.  She denies any adverse reactions to medications.  She reports there are really no current stressors and  things have been going all right Its just I feel no better or no worse.  She reports she is eating without difficulty and sleeping without difficulty but sometimes she wakes feeling fatigued but feels it may be related to her MS.  She denies suicidal/self-harm/homicidal ideation, psychosis, paranoia, and abnormal movement. Screenings completed during today's visit PHQ-9, C-SSRS, GAD-7, AIMS, AUDIT, Nutrition, and Pain, see scores below.  Treatment options discussed: Went over GeneSight test and optional medications.  Agreed to trial of Cymbalta, Wellbutrin  taper and increasing BuSpar .  Also discussed antidepressant discontinuation syndrome and educational material added to AVS  Recommendation: Start Cymbalta 30 mg daily, increase BuSpar  7.5 mg twice daily, Wellbutrin  taper:1.  Take 100 mg (1 tablet) twice daily for 5 days.  2.  Take 100 mg (1 tablet) daily for 5 days. 3.  Take 100 mg every other day for 5 days then stop/discontinue She was educated on the side effect and efficacy profile of Cymbalta and educational material was added to AVS. Informed that therapeutic effects may take several weeks to become noticeable.  She voiced understanding and agreement with today's plan and recommendations.  Visit Diagnosis:    ICD-10-CM   1. Moderate recurrent major depression (HCC)  F33.1 busPIRone  (BUSPAR ) 7.5 MG tablet    buPROPion  (WELLBUTRIN ) 100 MG tablet    DULoxetine (CYMBALTA) 30 MG capsule    2. GAD (generalized anxiety disorder)  F41.1 busPIRone  (BUSPAR ) 7.5 MG tablet    DULoxetine (CYMBALTA) 30 MG capsule      Past Psychiatric History: Major depression, general anxiety, PTSD   Past Medical History:  Past Medical History:  Diagnosis Date   Anxiety    Depression    Ectopic beat    Ectopic heartbeat    Hypertension    Less than [redacted] weeks gestation of pregnancy 09/27/2021   Migraine    Multiple sclerosis    Urinary urgency 12/26/2019    Past Surgical History:  Procedure Laterality  Date   NO PAST SURGERIES      Family Psychiatric History: Unaware  Family History:  Family History  Adopted: Yes  Family history unknown: Yes    Social History:  Social History   Socioeconomic History   Marital status: Single    Spouse name: Not on file   Number of children: 0   Years of education: Bachelors    Highest education level: Bachelor's degree (e.g., BA, AB, BS)  Occupational History   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   Tobacco Use   Smoking status: Never   Smokeless tobacco: Never  Vaping Use   Vaping status: Never Used  Substance and Sexual Activity   Alcohol use: Yes    Comment: rare   Drug use: Never   Sexual activity: Yes    Birth control/protection: Pill  Other Topics Concern   Not on file  Social History Narrative   Right handed    Lives with boyfriend   Caffeine use: Soda daily ( 1 per day)   Social Drivers of Corporate investment banker Strain: Low Risk  (12/28/2022)   Overall Financial Resource Strain (CARDIA)    Difficulty of Paying Living Expenses: Not very hard  Food Insecurity: No Food Insecurity (12/28/2022)   Hunger Vital Sign    Worried About Running Out of Food in the Last Year: Never true    Ran Out of Food in the Last Year: Never true  Transportation Needs: No Transportation Needs (12/28/2022)   PRAPARE - Administrator, Civil Service (Medical): No    Lack of Transportation (Non-Medical): No  Physical Activity: Insufficiently Active (12/28/2022)   Exercise Vital Sign    Days of Exercise per Week: 2 days    Minutes of Exercise per Session: 30 min  Stress: Stress Concern Present (12/28/2022)   [employer] of Occupational Health - Occupational Stress Questionnaire    Feeling of Stress : Very much  Social Connections: Socially Isolated (12/28/2022)   Social Connection and Isolation Panel    Frequency of Communication with Friends and Family: Three times a week    Frequency of Social Gatherings with Friends and Family:  Once a week    Attends Religious Services: Never    Database administrator or Organizations: No    Attends Engineer, structural: Not on file    Marital Status: Never married    Allergies: No Known Allergies  Metabolic Disorder Labs: Lab Results  Component Value Date   HGBA1C 5.5 01/05/2023   No results found for: PROLACTIN Lab Results  Component Value Date   CHOL 199 11/09/2023   TRIG 93.0 11/09/2023   HDL 43.00 11/09/2023   CHOLHDL 5 11/09/2023   VLDL 18.6 11/09/2023   LDLCALC 138 (H) 11/09/2023   LDLCALC 159 (H) 01/05/2023   Lab Results  Component Value Date   TSH 0.75 11/09/2023   TSH 1.20 07/05/2021    Current Medications: Current Outpatient Medications  Medication Sig Dispense Refill   buPROPion  (WELLBUTRIN ) 100 MG tablet Wellbutrin  taper:  1.  Take 100 mg (1 tablet) twice daily for 5 days.  2.  Take  100 mg (1 tablet) daily for 5 days. 3.  Take 100 mg every other day for 5 days then stop/discontinue 25 tablet 0   DULoxetine (CYMBALTA) 30 MG capsule Take 1 capsule (30 mg total) by mouth daily. 30 capsule 1   busPIRone  (BUSPAR ) 7.5 MG tablet Take 1 tablet (7.5 mg total) by mouth 2 (two) times daily. 60 tablet 1   natalizumab (TYSABRI) 300 MG/15ML injection Inject into the vein.     norethindrone  (MICRONOR ) 0.35 MG tablet Take 1 tablet (0.35 mg total) by mouth daily. 72 tablet 3   phentermine  37.5 MG capsule Take 1 capsule (37.5 mg total) by mouth every morning. 30 capsule 5   solifenacin  (VESICARE ) 10 MG tablet TAKE 1 TABLET BY MOUTH EVERY DAY 30 tablet 4   Vitamin D , Ergocalciferol , (DRISDOL ) 1.25 MG (50000 UNIT) CAPS capsule Take 1 capsule (50,000 Units total) by mouth every 7 (seven) days. 8 capsule 0   No current facility-administered medications for this visit.     Musculoskeletal: Strength & Muscle Tone: Unable to assess via virtual visit Gait & Station: Unable to assess via virtual visit Patient leans: N/A  Psychiatric Specialty Exam: Review  of Systems  Constitutional:        No other complaints voiced at this time  Psychiatric/Behavioral:  Positive for dysphoric mood. Negative for hallucinations, self-injury, sleep disturbance and suicidal ideas. Agitation: Irritability.The patient is nervous/anxious.   All other systems reviewed and are negative.   There were no vitals taken for this visit.There is no height or weight on file to calculate BMI.  General Appearance: Casual  Eye Contact:  Good  Speech:  Clear and Coherent and Normal Rate  Volume:  Normal  Mood:  Anxious and Dysphoric  Affect:  Congruent  Thought Process:  Coherent, Goal Directed, and Descriptions of Associations: Intact  Orientation:  Full (Time, Place, and Person)  Thought Content: Logical   Suicidal Thoughts:  No  Homicidal Thoughts:  No  Memory:  Immediate;   Good Recent;   Good Remote;   Good  Judgement:  Intact  Insight:  Present  Psychomotor Activity:  Normal  Concentration:  Concentration: Good and Attention Span: Good  Recall:  Good  Fund of Knowledge: Good  Language: Good  Akathisia:  No  Handed:  Right  AIMS (if indicated): done  Assets:  Communication Skills Desire for Improvement Financial Resources/Insurance Housing Leisure Time Physical Health Social Support Transportation  ADL's:  Intact  Cognition: WNL  Sleep:  Good   Screenings: AIMS    Flowsheet Row Video Visit from 12/06/2023 in [facility] at [facility] Video Visit from 06/21/2023 in [facility] at [facility]  AIMS Total Score 0 0   GAD-7    Flowsheet Row Video Visit from 12/06/2023 in [facility] at [facility] Office Visit from 11/09/2023 in [facility] Primary Care at [facility] Office Visit from 10/05/2023 in [facility] Care at [facility] Office Visit from 08/31/2023 in [facility] Primary Care  at [facility] Office Visit from 07/10/2023 in [facility] Care at [facility]  Total GAD-7 Score 11 14 15 8 19    PHQ2-9    Flowsheet Row Video Visit from 12/06/2023 in [facility] at [facility] Office Visit from 11/09/2023 in [facility] Primary Care at [facility] Office Visit from 10/05/2023 in [facility] Primary  Care at [facility] Office Visit from 08/31/2023 in [facility] Primary Care at [facility] Office Visit from 07/10/2023 in [facility] Primary Care at [facility]  PHQ-2 Total Score 3 4 4 2 6   PHQ-9 Total Score 10 15 15 11 22    Flowsheet Row Video Visit from 12/06/2023 in [facility] Outpatient Behavior[facility] at [facility] Video Visit from 06/21/2023 in [facility] Outpatient Behavioral Health at [facility] UC from 03/20/2023 in [facility] Urgent Care at [facility])  C-SSRS RISK CATEGORY No Risk No Risk No Risk     Assessment and Plan:  Assessment: Summary of today's assessment: Janet Duran appears to be doing fairly well.  She reports current medication regimen is not managing her mental health effectively.  There have been no adjustments to the BuSpar  since starting.  She has been taking the Wellbutrin  as ordered for about 5 to 6 months with no improvement most recent increase was a month ago.  She continues to express symptoms of depression and anxiety.  Has a history of PTSD but not reporting any symptoms at this time.  Reports eating and sleeping without difficulty.  She denies suicidal/self-harm/homicidal ideation, psychosis, paranoia, abnormal movement During visit she was dressed appropriate for age and weather.  She was seated comfortably seated in view of camera with no noted distress.  She was alert/oriented x 4, calm/cooperative and mood congruent with affect.  She spoke in a  clear tone at moderate volume, and normal pace, with good eye contact.  Her thought process was coherent, relevant, and there was no indication that she was responding to internal/external stimuli or experiencing delusional thought content.  1. Moderate recurrent major depression (HCC) (Primary) - busPIRone  (BUSPAR ) 7.5 MG tablet; Take 1 tablet (7.5 mg total) by mouth 2 (two) times daily.  Dispense: 60 tablet; Refill: 1 - buPROPion  (WELLBUTRIN ) 100 MG tablet; Wellbutrin  taper:  1.  Take 100 mg (1 tablet) twice daily for 5 days.  2.  Take 100 mg (1 tablet) daily for 5 days. 3.  Take 100 mg every other day for 5 days then stop/discontinue  Dispense: 25 tablet; Refill: 0 - DULoxetine (CYMBALTA) 30 MG capsule; Take 1 capsule (30 mg total) by mouth daily.  Dispense: 30 capsule; Refill: 1  2. GAD (generalized anxiety disorder) - busPIRone  (BUSPAR ) 7.5 MG tablet; Take 1 tablet (7.5 mg total) by mouth 2 (two) times daily.  Dispense: 60 tablet; Refill: 1 - DULoxetine (CYMBALTA) 30 MG capsule; Take 1 capsule (30 mg total) by mouth daily.  Dispense: 30 capsule; Refill: 1       Plan: Medication management: Meds ordered this encounter  Medications   busPIRone  (BUSPAR ) 7.5 MG tablet    Sig: Take 1 tablet (7.5 mg total) by mouth 2 (two) times daily.    Dispense:  60 tablet    Refill:  1    Supervising Provider:   CURRY, SYED T [[id number]]   buPROPion  (WELLBUTRIN ) 100 MG tablet    Sig: Wellbutrin  taper:  1.  Take 100 mg (1 tablet) twice daily for 5 days.  2.  Take 100 mg (1 tablet) daily for 5 days. 3.  Take 100 mg every other day for 5 days then stop/discontinue    Dispense:  25 tablet    Refill:  0    Supervising Provider:   ARFEEN, SYED T [[id number]]   DULoxetine (CYMBALTA) 30 MG capsule    Sig: Take 1 capsule (  30 mg total) by mouth daily.    Dispense:  30 capsule    Refill:  1    Supervising Provider:   ARFEEN, SYED T [[id number]]   Medications Discontinued During This Encounter  Medication Reason   buPROPion   (WELLBUTRIN  XL) 300 MG 24 hr tablet Change in therapy   busPIRone  (BUSPAR ) 5 MG tablet     Labs:  Not indicated at this time.      Other:  Counseling/Therapy: Declined referral to therapy at this time.   Janet Duran was instructed to call [phone], mobile crisis, or present to the nearest emergency room should she experiences any suicidal/homicidal ideation, auditory/visual/hallucinations, or detrimental worsening of her mental health condition.   Janet Duran participated in the development of this treatment plan and verbalized her understanding/agreement with plan as listed.   Follow Up: Return in 1 month for medication management Call in the interim for any side-effects, decompensation, questions, or problems  Collaboration of Care: Collaboration of Care: Medication Management AEB medication assessment, adjustment, refills, started Cymbalta and Other reviewed GeneSight test with patient  Patient/Guardian was advised Release of Information must be obtained prior to any record release in order to collaborate their care with an outside provider. Patient/Guardian was advised if they have not already done so to contact the registration department to sign all necessary forms in order for us  to release information regarding their care.   Consent: Patient/Guardian gives verbal consent for treatment and assignment of benefits for services provided during this visit. Patient/Guardian expressed understanding and agreed to proceed.    Janet Storer, NP 12/06/2023, 10:39 AM

## 2023-12-20 ENCOUNTER — Encounter: Payer: Self-pay | Admitting: Family Medicine

## 2023-12-20 DIAGNOSIS — N898 Other specified noninflammatory disorders of vagina: Secondary | ICD-10-CM

## 2023-12-21 ENCOUNTER — Ambulatory Visit: Admitting: Family Medicine

## 2023-12-21 ENCOUNTER — Encounter: Payer: Self-pay | Admitting: Family Medicine

## 2023-12-21 ENCOUNTER — Other Ambulatory Visit (HOSPITAL_COMMUNITY)
Admission: RE | Admit: 2023-12-21 | Discharge: 2023-12-21 | Disposition: A | Discharge status: Home / Self Care | Source: Ambulatory Visit | Attending: Family Medicine | Admitting: Family Medicine

## 2023-12-21 VITALS — BP 131/80 | HR 101 | Ht 62.0 in | Wt 165.0 lb

## 2023-12-21 DIAGNOSIS — N898 Other specified noninflammatory disorders of vagina: Secondary | ICD-10-CM

## 2023-12-21 MED ORDER — FLUCONAZOLE 150 MG PO TABS: 150.0000 mg | ORAL_TABLET | Freq: Every day | ORAL | 0 refills | Status: DC

## 2023-12-21 NOTE — Progress Notes (Addendum)
 Acute Office Visit  Subjective:  Patient ID: Janet Duran, female    DOB: 07-21-1995  Age: 28 y.o. MRN: [id number]  CC:  Chief Complaint  Patient presents with   Vaginal Discharge      HPI Janet Duran is here for Vaginal Itching.   Discussed the use of AI scribe software for clinical note transcription with the patient, who gave verbal consent to proceed.  History of Present Illness Janet Duran is a 28 year old female who presents with recurrent vaginal discharge and itching.  Her symptoms began in July after a long flight back from [place], during which she wore the same clothes for several days. The symptoms have been intermittent since then, characterized by a thick discharge and itching. No unusual smells, abnormal bleeding, or cramping.  She has not engaged in intercourse since her last encounter with her husband, who resides in [place], prior to her return to the [place] . She previously attempted treatment with a seven-day course of medication, which initially resolved the symptoms, but they have since recurred.  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            Past Medical History:  Diagnosis Date   Anxiety    Depression    Ectopic beat    Ectopic heartbeat    Hypertension    Less than [redacted] weeks gestation of pregnancy 09/27/2021   Migraine    Multiple sclerosis    Urinary urgency 12/26/2019    Past Surgical History:  Procedure Laterality Date   NO PAST SURGERIES      Family History  Adopted: Yes  Family history unknown: Yes    Social History   Socioeconomic History   Marital status: Single    Spouse name: Not on file   Number of children: 0   Years of education: Bachelors    Highest education level: Bachelor's degree (e.g., BA, AB, BS)  Occupational History   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   Tobacco Use   Smoking status: Never   Smokeless tobacco: Never   Vaping Use   Vaping status: Never Used  Substance and Sexual Activity   Alcohol use: Yes    Comment: rare   Drug use: Never   Sexual activity: Yes    Birth control/protection: Pill  Other Topics Concern   Not on file  Social History Narrative   Right handed    Lives with boyfriend   Caffeine use: Soda daily ( 1 per day)   Social Drivers of Corporate Investment Banker Strain: Low Risk  (12/28/2022)   Overall Financial Resource Strain (CARDIA)    Difficulty of Paying Living Expenses: Not very hard  Food Insecurity: No Food Insecurity (12/28/2022)   Hunger Vital Sign    Worried About Running Out of Food in the Last Year: Never true    Ran Out of Food in the Last Year: Never true  Transportation Needs: No Transportation Needs (12/28/2022)   PRAPARE - Administrator, Civil Service (Medical): No    Lack of Transportation (Non-Medical): No  Physical Activity: Insufficiently Active (12/28/2022)   Exercise Vital Sign    Days of Exercise per Week: 2 days    Minutes of Exercise per Session: 30 min  Stress: Stress Concern Present (12/28/2022)   [employer] of Occupational Health - Occupational Stress Questionnaire    Feeling of Stress : Very much  Social Connections: Socially Isolated (  12/28/2022)   Social Connection and Isolation Panel    Frequency of Communication with Friends and Family: Three times a week    Frequency of Social Gatherings with Friends and Family: Once a week    Attends Religious Services: Never    Database Administrator or Organizations: No    Attends Engineer, Structural: Not on file    Marital Status: Never married  Intimate Partner Violence: Not At Risk (03/25/2022)   Humiliation, Afraid, Rape, and Kick questionnaire    Fear of Current or Ex-Partner: No    Emotionally Abused: No    Physically Abused: No    Sexually Abused: No    ROS All ROS negative except what is listed in the HPI.   Objective:   Today's Vitals: BP 131/80    Pulse (!) 101   Ht 5' 2 (1.575 m)   Wt 165 lb (74.8 kg)   SpO2 98%   BMI 30.18 kg/m   Physical Exam Vitals reviewed.  Constitutional:      Appearance: Normal appearance.  Genitourinary:    Comments: Deferred, self-swab Neurological:     Mental Status: She is alert and oriented to person, place, and time.  Psychiatric:        Mood and Affect: Mood normal.        Behavior: Behavior normal.        Thought Content: Thought content normal.        Judgment: Judgment normal.     Assessment & Plan:   Problem List Items Addressed This Visit   None Visit Diagnoses       Vaginal discharge    -  Primary   Relevant Medications   fluconazole (DIFLUCAN) 150 MG tablet   Other Relevant Orders   Cervicovaginal ancillary only       Assessment & Plan Recurrent vaginal discharge and pruritus Recurrent thick discharge and itching since July.  - Self-swab for vaginal discharge analysis  - Will go ahead and treat with Diflucan. Further manage pending results.     Follow-up: Return if symptoms worsen or fail to improve.   Waddell FURY Almarie, DNP, FNP-C  I,Emily Lagle,acting as a neurosurgeon for Waddell KATHEE Almarie, NP.,have documented all relevant documentation on the behalf of Waddell KATHEE Almarie, NP,as directed by  Waddell KATHEE Almarie, NP while in the presence of Waddell KATHEE Almarie, NP.   I, Waddell KATHEE Almarie, NP, have reviewed all documentation for this visit. The documentation on 12/21/2023 for the exam, diagnosis, procedures, and orders are all accurate and complete.

## 2023-12-21 NOTE — Progress Notes (Deleted)
 Acute Office Visit  Subjective:  Patient ID: Janet Duran, female    DOB: 1995/03/09  Age: 28 y.o. MRN: [id number]  CC:  Chief Complaint  Patient presents with   Vaginal Discharge      HPI Janet Duran is here for Vaginal Itching. Vaginal dc, itching, redness. Denies urinary symptoms, vaginal odor, abdominal pain, vag bleeding.  Use Monistat 7 days once, and it helped, but symptoms have returned.        Past Medical History:  Diagnosis Date   Anxiety    Depression    Ectopic beat    Ectopic heartbeat    Hypertension    Less than [redacted] weeks gestation of pregnancy 09/27/2021   Migraine    Multiple sclerosis    Urinary urgency 12/26/2019    Past Surgical History:  Procedure Laterality Date   NO PAST SURGERIES      Family History  Adopted: Yes  Family history unknown: Yes    Social History   Socioeconomic History   Marital status: Single    Spouse name: Not on file   Number of children: 0   Years of education: Bachelors    Highest education level: Bachelor's degree (e.g., BA, AB, BS)  Occupational History   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   Tobacco Use   Smoking status: Never   Smokeless tobacco: Never  Vaping Use   Vaping status: Never Used  Substance and Sexual Activity   Alcohol use: Yes    Comment: rare   Drug use: Never   Sexual activity: Yes    Birth control/protection: Pill  Other Topics Concern   Not on file  Social History Narrative   Right handed    Lives with boyfriend   Caffeine use: Soda daily ( 1 per day)   Social Drivers of Corporate Investment Banker Strain: Low Risk  (12/28/2022)   Overall Financial Resource Strain (CARDIA)    Difficulty of Paying Living Expenses: Not very hard  Food Insecurity: No Food Insecurity (12/28/2022)   Hunger Vital Sign    Worried About Running Out of Food in the Last Year: Never true    Ran Out of Food in the Last Year: Never true  Transportation Needs: No Transportation Needs  (12/28/2022)   PRAPARE - Administrator, Civil Service (Medical): No    Lack of Transportation (Non-Medical): No  Physical Activity: Insufficiently Active (12/28/2022)   Exercise Vital Sign    Days of Exercise per Week: 2 days    Minutes of Exercise per Session: 30 min  Stress: Stress Concern Present (12/28/2022)   [employer] of Occupational Health - Occupational Stress Questionnaire    Feeling of Stress : Very much  Social Connections: Socially Isolated (12/28/2022)   Social Connection and Isolation Panel    Frequency of Communication with Friends and Family: Three times a week    Frequency of Social Gatherings with Friends and Family: Once a week    Attends Religious Services: Never    Database Administrator or Organizations: No    Attends Engineer, Structural: Not on file    Marital Status: Never married  Intimate Partner Violence: Not At Risk (03/25/2022)   Humiliation, Afraid, Rape, and Kick questionnaire    Fear of Current or Ex-Partner: No    Emotionally Abused: No    Physically Abused: No    Sexually Abused: No    ROS All ROS negative except what is listed in the HPI.  Objective:   Today's Vitals: BP (!) 144/88   Pulse (!) 116   Ht 5' 2 (1.575 m)   Wt 74.8 kg   SpO2 98%   BMI 30.18 kg/m   Physical Exam  Assessment & Plan:   Problem List Items Addressed This Visit   None Visit Diagnoses       Vaginal discharge    -  Primary         Follow-up: No follow-ups on file.   Waddell FURY Almarie, DNP, FNP-C  I,Emily Lagle,acting as a neurosurgeon for Waddell KATHEE Almarie, NP.,have documented all relevant documentation on the behalf of Waddell KATHEE Almarie, NP,as directed by  Waddell KATHEE Almarie, NP while in the presence of Waddell KATHEE Almarie, NP.   I, Waddell KATHEE Almarie, NP, have reviewed all documentation for this visit. The documentation on 12/21/2023 for the exam, diagnosis, procedures, and orders are all accurate and complete.

## 2023-12-25 ENCOUNTER — Ambulatory Visit: Payer: Self-pay | Admitting: Family Medicine

## 2023-12-25 DIAGNOSIS — B9689 Other specified bacterial agents as the cause of diseases classified elsewhere: Secondary | ICD-10-CM

## 2023-12-25 LAB — CERVICOVAGINAL ANCILLARY ONLY
Bacterial Vaginitis (gardnerella): POSITIVE — AB
Candida Glabrata: NEGATIVE
Candida Vaginitis: POSITIVE — AB
Chlamydia: NEGATIVE
Comment: NEGATIVE
Comment: NEGATIVE
Comment: NEGATIVE
Comment: NEGATIVE
Comment: NEGATIVE
Comment: NORMAL
Neisseria Gonorrhea: NEGATIVE
Trichomonas: NEGATIVE

## 2023-12-25 MED ORDER — METRONIDAZOLE 500 MG PO TABS: 500.0000 mg | ORAL_TABLET | Freq: Two times a day (BID) | ORAL | 0 refills | Status: AC

## 2023-12-28 ENCOUNTER — Other Ambulatory Visit: Payer: Self-pay | Admitting: Family Medicine

## 2023-12-28 DIAGNOSIS — E559 Vitamin D deficiency, unspecified: Secondary | ICD-10-CM

## 2024-01-04 ENCOUNTER — Other Ambulatory Visit: Payer: Self-pay

## 2024-01-04 MED ORDER — SOLIFENACIN SUCCINATE 10 MG PO TABS: ORAL_TABLET | ORAL | 4 refills | Status: AC

## 2024-01-17 ENCOUNTER — Encounter: Payer: Self-pay | Admitting: Neurology

## 2024-01-17 MED ORDER — FLUCONAZOLE 150 MG PO TABS: 150.0000 mg | ORAL_TABLET | Freq: Every day | ORAL | 0 refills | Status: AC

## 2024-01-31 ENCOUNTER — Encounter: Payer: Self-pay | Admitting: Neurology

## 2024-02-08 IMAGING — MR MR ABDOMEN WO/W CM
17 series · 48 of 48 positions shown · IV contrast (16 ML MULTIHANCE)
Comparison: 02/25/2021

CLINICAL DATA: Follow-up liver lesion.

EXAM:
MRI ABDOMEN WITHOUT AND WITH CONTRAST
TECHNIQUE: Multiplanar multisequence MR imaging of the abdomen was performed
both before and after the administration of intravenous contrast.
CONTRAST:  16mL MULTIHANCE GADOBENATE DIMEGLUMINE 529 MG/ML IV SOLN

[Series 3: T2 · coronal · 5.0mm · 1.56mm/px · 1 of 33 slices shown (1 of 3)]
[im 1/33]
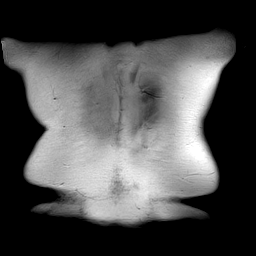

[Series 4: T1 · axial · 3.0mm · 1.19mm/px · z∈[-96,+117]mm · 6 of 144 slices shown]
[im 1/144]
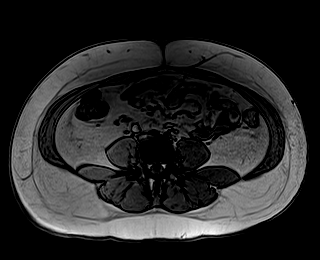
[im 29/144]
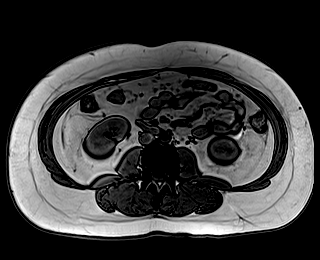
[im 58/144]
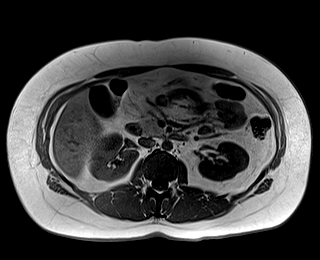
[im 86/144]
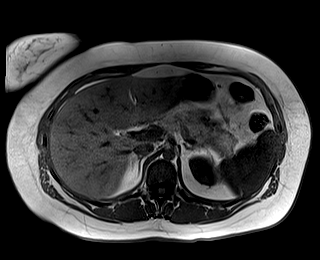
[im 115/144]
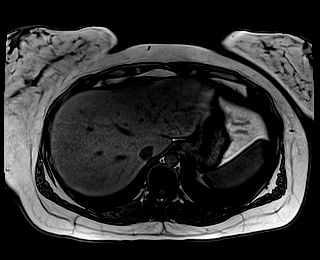
[im 144/144]
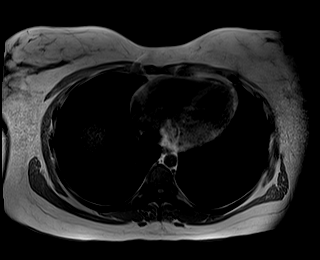

[Series 5: bSSFP · axial · 5.0mm · 1.25mm/px · 1 of 35 slices shown]
[im 1/35]
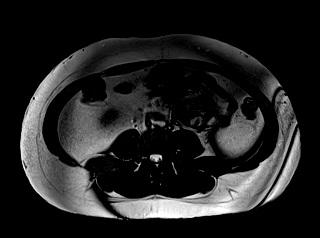

[Series 6: T2 · axial · 5.0mm · 1.48mm/px · z∈[-90,+132]mm · 2 of 38 slices shown (2 of 3)]
[im 1/38]
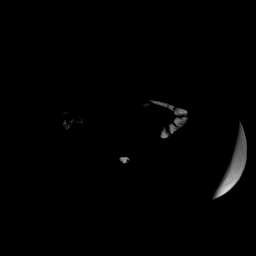
[im 38/38]
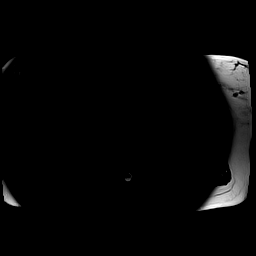

[Series 7: DWI · axial · 5.0mm · 1.42mm/px · z∈[-84,+126]mm · 5 of 108 slices shown (1 of 2)]
[im 1/108]
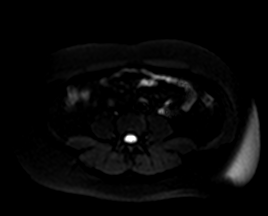
[im 27/108]
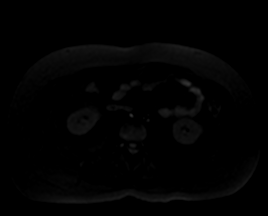
[im 54/108]
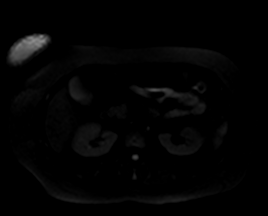
[im 81/108]
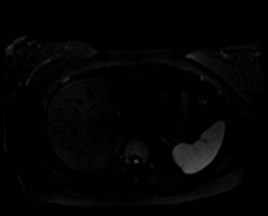
[im 108/108]
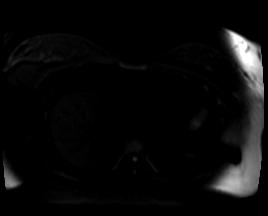

[Series 8: DWI · axial · 5.0mm · 1.42mm/px · z∈[-84,+126]mm · 2 of 36 slices shown (2 of 2)]
[im 1/36]
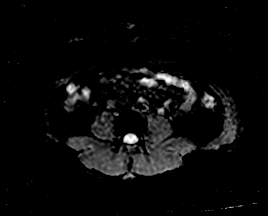
[im 36/36]
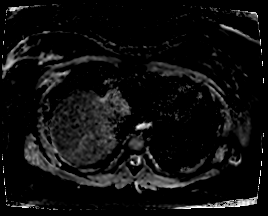

[Series 9: T2 · axial · 6.0mm · 1.19mm/px · 1 of 31 slices shown (3 of 3)]
[im 1/31]
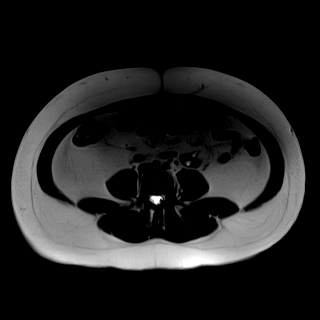

[Series 10: T1 dynamic · axial · non-contrast · 3.0mm · 1.25mm/px · z∈[-96,+117]mm · 3 of 72 slices shown]
[im 1/72]
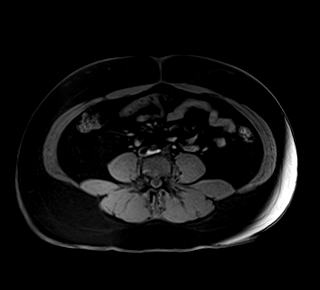
[im 36/72]
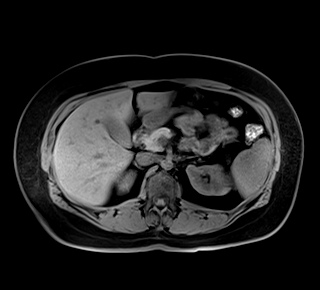
[im 72/72]
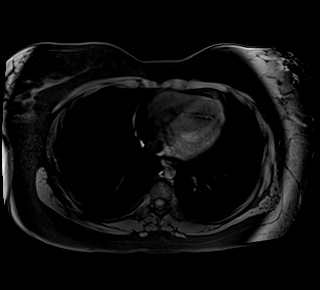

[Series 11: T1 dynamic post-contrast · axial · 3.0mm · 1.25mm/px · z∈[-96,+117]mm · 3 of 72 slices shown (1 of 9)]
[im 1/72]
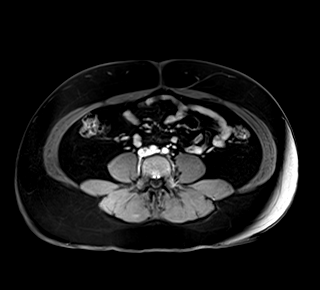
[im 36/72]
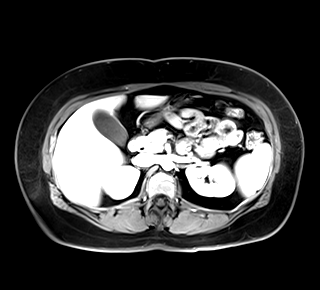
[im 72/72]
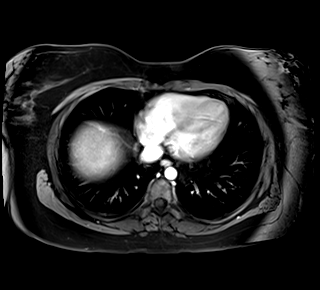

[Series 12: T1 dynamic post-contrast · axial · 3.0mm · 1.25mm/px · z∈[-96,+117]mm · 3 of 72 slices shown (2 of 9)]
[im 1/72]
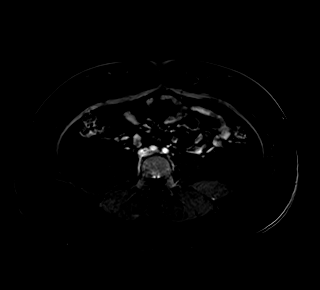
[im 36/72]
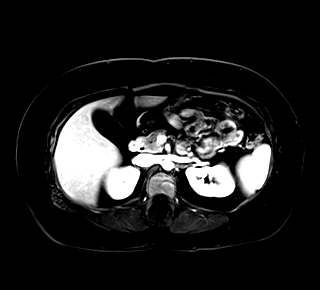
[im 72/72]
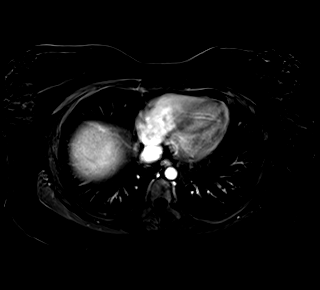

[Series 13: T1 dynamic post-contrast · axial · 3.0mm · 1.25mm/px · z∈[-96,+117]mm · 3 of 71 slices shown (3 of 9)]
[im 1/71]
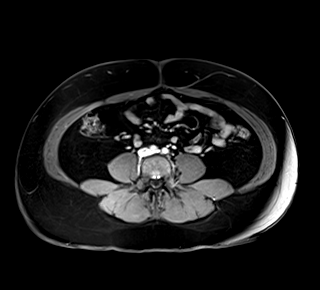
[im 36/71]
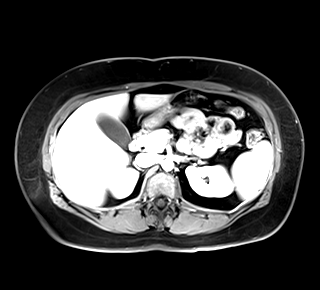
[im 71/71]
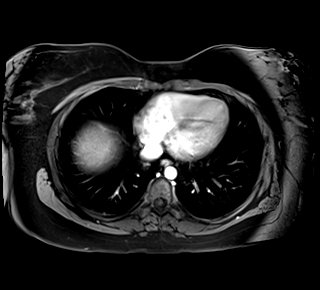

[Series 14: T1 dynamic post-contrast · axial · 3.0mm · 1.25mm/px · z∈[-96,+117]mm · 3 of 68 slices shown (4 of 9)]
[im 1/68]
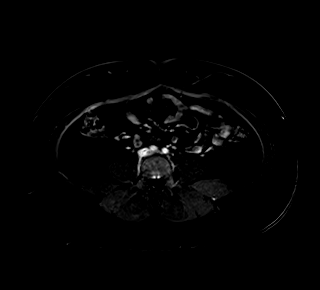
[im 34/68]
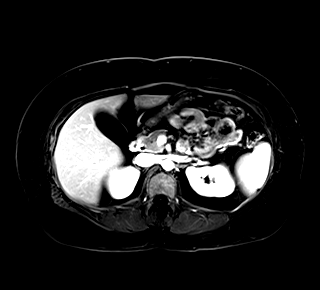
[im 68/68]
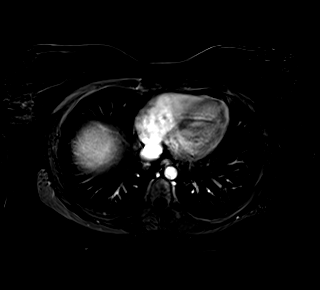

[Series 15: T1 dynamic post-contrast · axial · 3.0mm · 1.25mm/px · z∈[-96,+117]mm · 3 of 72 slices shown (5 of 9)]
[im 1/72]
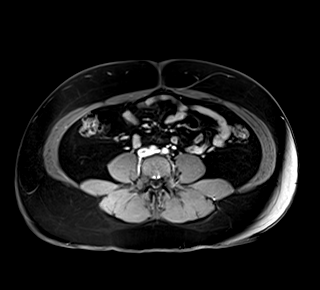
[im 36/72]
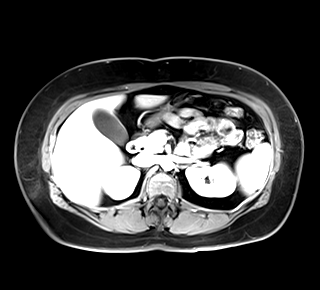
[im 72/72]
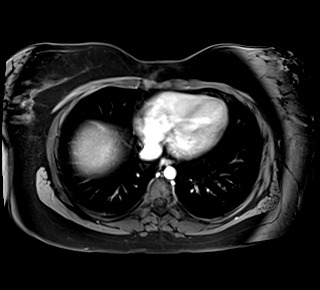

[Series 16: T1 dynamic post-contrast · axial · 3.0mm · 1.25mm/px · z∈[-96,+117]mm · 3 of 72 slices shown (6 of 9)]
[im 1/72]
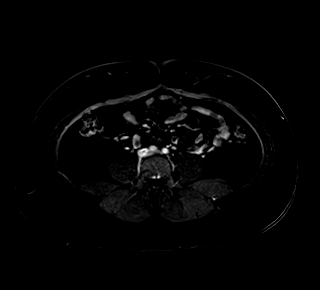
[im 36/72]
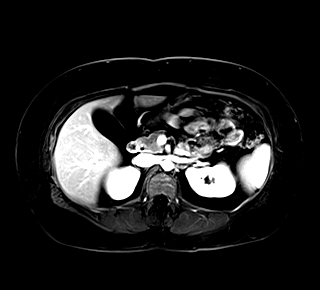
[im 72/72]
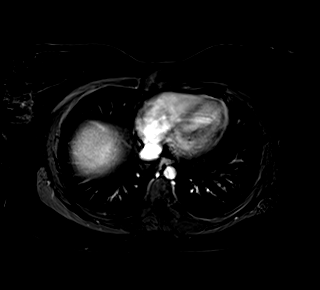

[Series 17: T1 dynamic post-contrast · coronal · 3.0mm · 1.25mm/px · 3 of 64 slices shown (7 of 9)]
[im 1/64]
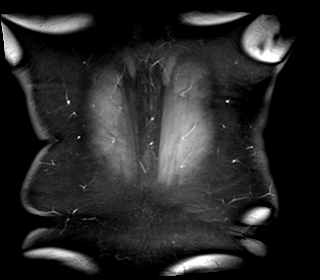
[im 32/64]
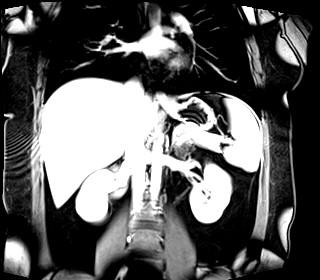
[im 64/64]
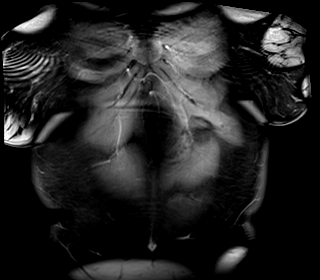

[Series 18: T1 dynamic post-contrast · axial · 3.0mm · 1.25mm/px · z∈[-96,+117]mm · 3 of 72 slices shown (8 of 9)]
[im 1/72]
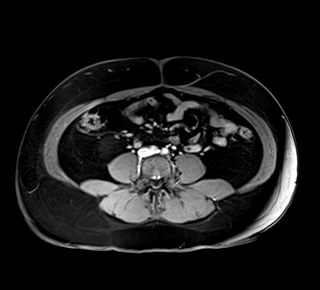
[im 36/72]
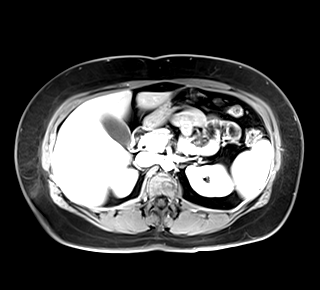
[im 72/72]
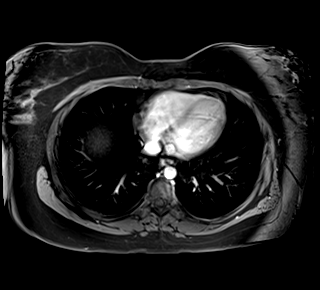

[Series 19: T1 dynamic post-contrast · axial · 3.0mm · 1.25mm/px · z∈[-96,+117]mm · 3 of 72 slices shown (9 of 9)]
[im 1/72]
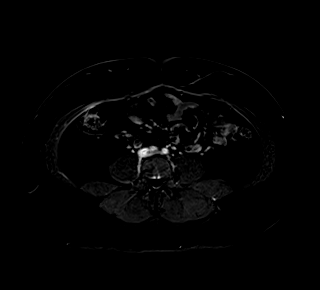
[im 36/72]
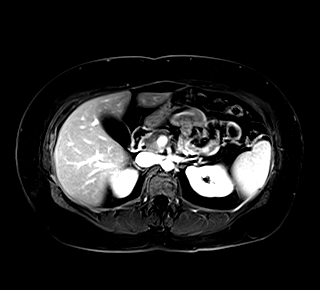
[im 72/72]
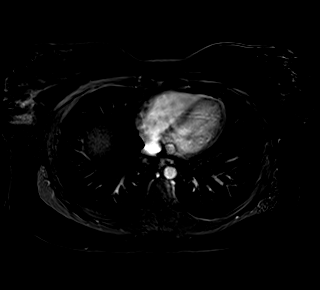

[48 of 48 positions shown; findings below may reference images not displayed]

FINDINGS: Lower chest: No acute findings.

Hepatobiliary: No hepatic masses identified. Previously seen 5 mm
focus of hyperenhancement in the inferior right hepatic lobe is no
longer visualized on the current exam. This likely represents a tiny
vascular shunt. No hepatic lesions are seen on any sequences,
including diffusion imaging. Gallbladder is unremarkable. No
evidence of biliary ductal dilatation.

Pancreas:  No mass or inflammatory changes.

Spleen:  Within normal limits in size and appearance.

Adrenals/Urinary Tract: No masses identified. No evidence of
hydronephrosis.

Stomach/Bowel: Unremarkable.

Vascular/Lymphatic: No pathologically enlarged lymph nodes
identified. No acute vascular findings.

Other:  None.

Musculoskeletal:  No suspicious bone lesions identified.
IMPRESSION: No evidence of hepatic neoplasm or other significant abnormality.

## 2024-02-13 ENCOUNTER — Other Ambulatory Visit: Payer: Self-pay

## 2024-02-13 DIAGNOSIS — G35A Relapsing-remitting multiple sclerosis: Secondary | ICD-10-CM

## 2024-02-20 ENCOUNTER — Ambulatory Visit: Payer: Self-pay | Admitting: Neurology

## 2024-02-20 LAB — STRATIFY JCV(TM) AB W/INDEX
JCV Antibody: NEGATIVE
JCV Index Value: 0.08

## 2024-02-26 ENCOUNTER — Other Ambulatory Visit (HOSPITAL_COMMUNITY): Payer: Self-pay | Admitting: Registered Nurse

## 2024-02-26 ENCOUNTER — Telehealth (HOSPITAL_COMMUNITY): Payer: Self-pay | Admitting: Registered Nurse

## 2024-02-26 DIAGNOSIS — F331 Major depressive disorder, recurrent, moderate: Secondary | ICD-10-CM

## 2024-02-26 DIAGNOSIS — F411 Generalized anxiety disorder: Secondary | ICD-10-CM

## 2024-02-26 MED ORDER — BUSPIRONE HCL 7.5 MG PO TABS: 7.5000 mg | ORAL_TABLET | Freq: Two times a day (BID) | ORAL | 0 refills | Status: DC

## 2024-02-26 MED ORDER — DULOXETINE HCL 30 MG PO CPEP: 30.0000 mg | ORAL_CAPSULE | Freq: Every day | ORAL | 0 refills | Status: DC

## 2024-02-26 NOTE — Telephone Encounter (Signed)
 Received fax from patient's pharmacy requesting refills of:  busPIRone  (BUSPAR ) 7.5 MG tablet  Last ordered: 12/06/2023 - 60 tablets with 1 refill   DULoxetine  (CYMBALTA ) 30 MG capsule  Last ordered: 12/06/2023 - 30 capsules with 1 refill   [id number] - [place], [place] - [address] (Ph: [phone])   Last visit: 12/06/2023 Next visit: 02/28/2024  Called patient to schedule follow-up appointment and she indicated she had enough medication to last until her 02/28/2024 appointment.

## 2024-02-28 ENCOUNTER — Encounter (HOSPITAL_COMMUNITY): Payer: Self-pay | Admitting: Registered Nurse

## 2024-02-28 ENCOUNTER — Telehealth (HOSPITAL_COMMUNITY): Admitting: Registered Nurse

## 2024-02-28 DIAGNOSIS — F331 Major depressive disorder, recurrent, moderate: Secondary | ICD-10-CM

## 2024-02-28 DIAGNOSIS — F411 Generalized anxiety disorder: Secondary | ICD-10-CM

## 2024-02-28 MED ORDER — DULOXETINE HCL 30 MG PO CPEP: 30.0000 mg | ORAL_CAPSULE | Freq: Two times a day (BID) | ORAL | 1 refills | Status: AC

## 2024-02-28 MED ORDER — BUSPIRONE HCL 7.5 MG PO TABS: 7.5000 mg | ORAL_TABLET | Freq: Two times a day (BID) | ORAL | 1 refills | Status: AC

## 2024-02-28 NOTE — Progress Notes (Signed)
 [facility] MD/PA/NP OP Progress Note  02/28/2024 10:03 AM Janet Duran  MRN:  [id number]   Virtual Visit via Video Note  I connected with Janet Duran on 02/28/2024 at  9:30 AM EST by a video enabled telemedicine application and verified that I am speaking with the correct person using two identifiers.  Location: Patient: Home Provider: Home office   I discussed the limitations of evaluation and management by telemedicine and the availability of in person appointments. The patient expressed understanding and agreed to proceed.  I discussed the assessment and treatment plan with the patient. The patient was provided an opportunity to ask questions and all were answered. The patient agreed with the plan and demonstrated an understanding of the instructions.   The patient was advised to call back or seek an in-person evaluation if the symptoms worsen or if the condition fails to improve as anticipated.  I provided 30 minutes of non-face-to-face time during this encounter.  I personally spent a total of 30 minutes in the care of the patient today including preparing to see the patient, getting/reviewing separately obtained history, performing a medically appropriate exam/evaluation, counseling and educating, placing orders, and documenting clinical information in the EHR, in addition to conducting screenings PHQ-9, C-SSRS, GAD-7, AIMS, Nutrition, and Pain, discussing medication options, medication education, and discussing safety.   Janet Ruder, NP   Chief Complaint:  Chief Complaint  Patient presents with   Establish Care    Medication management   Follow-up   HPI: Janet Duran 29 y.o. female presents today for medication management follow up and to reestablish care.  She was seen via virtual video visit by this provide and chart reviewed on 02/28/2024  Her psychiatric history is significant for major depression, general anxiety, PTSD.  Her mental health is currently  managed with Cymbalta  30 mg daily, BuSpar  7.5 mg twice daily, Wellbutrin  taper, she reports current medications are effectively managing her mental health without any adverse reaction.  She reports she has completed the Wellbutrin  taper without any problems.  She reports she feels pretty good and that things have been going well.  She reports she feels pretty stable.  Reports eating and sleeping without any difficulty.  She denies suicidal/self-harm/homicidal ideation, psychosis, paranoia, and abnormal movement.  Screenings completed during today's visit PHQ-9, C-SSRS, GAD-7, AIMS, Nutrition, and Pain, see scores below. Discussed medication and screenings noting improvement but still having issues agrees to adjustment of Cymbalta .  Recommendation: Increase Cymbalta  30 mg twice daily, continue BuSpar  7.5 mg twice daily.  She voiced understanding and agreement with today's plan and recommendations.  Visit Diagnosis:    ICD-10-CM   1. Moderate recurrent major depression (HCC)  F33.1 DULoxetine  (CYMBALTA ) 30 MG capsule    busPIRone  (BUSPAR ) 7.5 MG tablet    2. GAD (generalized anxiety disorder)  F41.1 DULoxetine  (CYMBALTA ) 30 MG capsule    busPIRone  (BUSPAR ) 7.5 MG tablet     Past Psychiatric History: Major depression, general anxiety, PTSD   Past Medical History:  Past Medical History:  Diagnosis Date   Anxiety    Depression    Ectopic beat    Ectopic heartbeat    Hypertension    Less than [redacted] weeks gestation of pregnancy 09/27/2021   Migraine    Multiple sclerosis    Urinary urgency 12/26/2019    Past Surgical History:  Procedure Laterality Date   NO PAST SURGERIES      Family Psychiatric History: Unaware  Family History:  Family History  Adopted:  Yes  Family history unknown: Yes    Social History:  Social History   Socioeconomic History   Marital status: Single    Spouse name: Not on file   Number of children: 0   Years of education: Bachelors    Highest education  level: Bachelor's degree (e.g., BA, AB, BS)  Occupational History   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   Tobacco Use   Smoking status: Never   Smokeless tobacco: Never  Vaping Use   Vaping status: Never Used  Substance and Sexual Activity   Alcohol use: Yes    Comment: rare   Drug use: Never   Sexual activity: Yes    Birth control/protection: Pill  Other Topics Concern   Not on file  Social History Narrative   Right handed    Lives with boyfriend   Caffeine use: Soda daily ( 1 per day)   Social Drivers of Health   Tobacco Use: Low Risk (02/28/2024)   Patient History    Smoking Tobacco Use: Never    Smokeless Tobacco Use: Never    Passive Exposure: Not on file  Financial Resource Strain: Low Risk (12/28/2022)   Overall Financial Resource Strain (CARDIA)    Difficulty of Paying Living Expenses: Not very hard  Food Insecurity: No Food Insecurity (12/28/2022)   Hunger Vital Sign    Worried About Running Out of Food in the Last Year: Never true    Ran Out of Food in the Last Year: Never true  Transportation Needs: No Transportation Needs (12/28/2022)   PRAPARE - Administrator, Civil Service (Medical): No    Lack of Transportation (Non-Medical): No  Physical Activity: Insufficiently Active (12/28/2022)   Exercise Vital Sign    Days of Exercise per Week: 2 days    Minutes of Exercise per Session: 30 min  Stress: Stress Concern Present (12/28/2022)   [employer] of Occupational Health - Occupational Stress Questionnaire    Feeling of Stress : Very much  Social Connections: Socially Isolated (12/28/2022)   Social Connection and Isolation Panel    Frequency of Communication with Friends and Family: Three times a week    Frequency of Social Gatherings with Friends and Family: Once a week    Attends Religious Services: Never    Database Administrator or Organizations: No    Attends Engineer, Structural: Not on file    Marital Status: Never married  Depression  (PHQ2-9): Medium Risk (02/28/2024)   Depression (PHQ2-9)    PHQ-2 Score: 8  Alcohol Screen: Low Risk (12/06/2023)   Alcohol Screen    Last Alcohol Screening Score (AUDIT): 2  Housing: Low Risk (12/28/2022)   Housing    Last Housing Risk Score: 0  Utilities: Not At Risk (03/25/2022)   AHC Utilities    Threatened with loss of utilities: No  Health Literacy: Not on file    Allergies: No Known Allergies  Metabolic Disorder Labs: Lab Results  Component Value Date   HGBA1C 5.5 01/05/2023   No results found for: PROLACTIN Lab Results  Component Value Date   CHOL 199 11/09/2023   TRIG 93.0 11/09/2023   HDL 43.00 11/09/2023   CHOLHDL 5 11/09/2023   VLDL 18.6 11/09/2023   LDLCALC 138 (H) 11/09/2023   LDLCALC 159 (H) 01/05/2023   Lab Results  Component Value Date   TSH 0.75 11/09/2023   TSH 1.20 07/05/2021    Current Medications: Current Outpatient Medications  Medication Sig Dispense Refill   busPIRone  (  BUSPAR ) 7.5 MG tablet Take 1 tablet (7.5 mg total) by mouth 2 (two) times daily. 120 tablet 1   DULoxetine  (CYMBALTA ) 30 MG capsule Take 1 capsule (30 mg total) by mouth 2 (two) times daily. 120 capsule 1   fluconazole  (DIFLUCAN ) 150 MG tablet Take 1 tablet (150 mg total) by mouth daily. May repeat in 3 days if needed. 2 tablet 0   natalizumab (TYSABRI) 300 MG/15ML injection Inject into the vein.     phentermine  37.5 MG capsule Take 1 capsule (37.5 mg total) by mouth every morning. 30 capsule 5   solifenacin  (VESICARE ) 10 MG tablet TAKE 1 TABLET BY MOUTH EVERY DAY 30 tablet 4   Vitamin D , Ergocalciferol , (DRISDOL ) 1.25 MG (50000 UNIT) CAPS capsule TAKE 1 CAPSULE (50,000 UNITS TOTAL) BY MOUTH EVERY 7 (SEVEN) DAYS 12 capsule 1   No current facility-administered medications for this visit.     Musculoskeletal: Strength & Muscle Tone: Unable to assess via virtual visit Gait & Station: Unable to assess via virtual visit Patient leans: N/A  Psychiatric Specialty Exam: Review  of Systems  Constitutional:        No other complaints voiced at this time  Psychiatric/Behavioral:  Negative for hallucinations, self-injury, sleep disturbance and suicidal ideas. Agitation: Reporting stable mood. Dysphoric mood: Reporting stable.Nervous/anxious: Reporting stable.   All other systems reviewed and are negative.   There were no vitals taken for this visit.There is no height or weight on file to calculate BMI.  General Appearance: Casual  Eye Contact:  Good  Speech:  Clear and Coherent and Normal Rate  Volume:  Normal  Mood:  Euthymic  Affect:  Congruent  Thought Process:  Coherent, Goal Directed, and Descriptions of Associations: Intact  Orientation:  Full (Time, Place, and Person)  Thought Content: Logical   Suicidal Thoughts:  No  Homicidal Thoughts:  No  Memory:  Immediate;   Good Recent;   Good Remote;   Good  Judgement:  Intact  Insight:  Present  Psychomotor Activity:  Normal  Concentration:  Concentration: Good and Attention Span: Good  Recall:  Good  Fund of Knowledge: Good  Language: Good  Akathisia:  No  Handed:  Right  AIMS (if indicated): done  Assets:  Communication Skills Desire for Improvement Financial Resources/Insurance Housing Leisure Time Physical Health Social Support Transportation  ADL's:  Intact  Cognition: WNL  Sleep:  Good   Screenings: AIMS    Flowsheet Row Video Visit from 02/28/2024 in [facility] Video Visit from 12/06/2023 in [facility] Video Visit from 06/21/2023 in [facility] at [facility]  AIMS Total Score 0 0 0   GAD-7    Flowsheet Row Video Visit from 02/28/2024 in [facility] at [facility] Office Visit from 12/21/2023 in [facility] at [facility] Video Visit from 12/06/2023 in [facility]  Outpatient Behavioral Health at [facility] Office Visit from 11/09/2023 in [facility] Care at [facility] Office Visit from 10/05/2023 in [facility] Primary Care at [facility]  Total GAD-7 Score 3 11 11 14 15    PHQ2-9    Flowsheet Row Video Visit from 02/28/2024 in [facility] at [facility] Office Visit from 12/21/2023 in [facility] Care at [facility] Video Visit from 12/06/2023 in [facility] Outpatient [facility] at [facility]  Visit from 11/09/2023 in [facility] Care at [facility] Office Visit from 10/05/2023 in [facility] at [facility]  PHQ-2 Total Score 2 3 3 4 4   PHQ-9 Total Score 8 11 10 15 15    Flowsheet Row Video Visit from 02/28/2024 in [facility] Behavioral Health at [facility] Video Visit from 12/06/2023 in [facility] at [facility] Video Visit from 06/21/2023 in [facility] Outpatient Behavioral Health at [facility]  C-SSRS RISK CATEGORY No Risk No Risk No Risk     Assessment and Plan:  Assessment: Summary of today's assessment: Janet Duran reported current medications are effectively managing her mental health without any adverse reaction.  Reported noticeable improvement in mental health.  Reported eating and sleeping without any difficulty.  She denied suicidal/self-harm/homicidal ideation, psychosis, paranoia, and abnormal movement. During visit Janet Duran is dressed appropriately for age and current weather.  She is seated comfortably in view of camera with no noted distress.  She is alert, oriented x 4, calm, cooperative, and attentive.  Her mood is congruent with affect.  She had normal speech and behavior.  Objectively there was no evidence of psychosis, mania, or delusional thinking.   She  was able to converse coherently and responded appropriately with goal directed thoughts, no distractibility, or pre-occupation, and she responded appropriately to questions    1. Moderate recurrent major depression (HCC) (Primary) - DULoxetine  (CYMBALTA ) 30 MG capsule; Take 1 capsule (30 mg total) by mouth 2 (two) times daily.  Dispense: 120 capsule; Refill: 1 - busPIRone  (BUSPAR ) 7.5 MG tablet; Take 1 tablet (7.5 mg total) by mouth 2 (two) times daily.  Dispense: 120 tablet; Refill: 1  2. GAD (generalized anxiety disorder) - DULoxetine  (CYMBALTA ) 30 MG capsule; Take 1 capsule (30 mg total) by mouth 2 (two) times daily.  Dispense: 120 capsule; Refill: 1 - busPIRone  (BUSPAR ) 7.5 MG tablet; Take 1 tablet (7.5 mg total) by mouth 2 (two) times daily.  Dispense: 120 tablet; Refill: 1    Plan: Medication management: Meds ordered this encounter  Medications   DULoxetine  (CYMBALTA ) 30 MG capsule    Sig: Take 1 capsule (30 mg total) by mouth 2 (two) times daily.    Dispense:  120 capsule    Refill:  1    Supervising Provider:   CURRY, SYED T [[id number]]   busPIRone  (BUSPAR ) 7.5 MG tablet    Sig: Take 1 tablet (7.5 mg total) by mouth 2 (two) times daily.    Dispense:  120 tablet    Refill:  1    Supervising Provider:   ARFEEN, SYED T [[id number]]   Medications Discontinued During This Encounter  Medication Reason   buPROPion  (WELLBUTRIN ) 100 MG tablet Completed Course   busPIRone  (BUSPAR ) 7.5 MG tablet Reorder   DULoxetine  (CYMBALTA ) 30 MG capsule Reorder   Labs:  Not indicated at this time.      Other:  Counseling/Therapy: Continues to decline services.  Janet Duran was instructed to call [phone], mobile crisis, or present to the nearest emergency room should she experiences any suicidal/homicidal ideation, auditory/visual/hallucinations, or detrimental worsening of her mental health condition.   Janet Duran participated in the development of this treatment plan and  verbalized her understanding/agreement with plan as listed.   Follow Up: Return in 3 month for medication management Call in the interim for any side-effects, decompensation, questions, or problems  Collaboration of Care: Collaboration of Care:  Medication Management AEB medication assessment, adjustment, and refills  Patient/Guardian was advised Release of Information must be obtained prior to any record release in order to collaborate their care with an outside provider. Patient/Guardian was advised if they have not already done so to contact the registration department to sign all necessary forms in order for us  to release information regarding their care.   Consent: Patient/Guardian gives verbal consent for treatment and assignment of benefits for services provided during this visit. Patient/Guardian expressed understanding and agreed to proceed.    Janet Manrique, NP 02/28/2024, 10:03 AM

## 2024-02-28 NOTE — Patient Instructions (Signed)

## 2024-05-28 ENCOUNTER — Ambulatory Visit: Admitting: Family Medicine

## 2024-11-11 ENCOUNTER — Encounter: Admitting: Family Medicine
